# Patient Record
Sex: Male | Born: 1995 | Race: White | Hispanic: Yes | Marital: Single | Smoking: Former smoker
Health system: Southern US, Community
[De-identification: ages and names within clinical notes are randomized; demographics above are authoritative.]

## PROBLEM LIST (undated history)

## (undated) DIAGNOSIS — F909 Attention-deficit hyperactivity disorder, unspecified type: Secondary | ICD-10-CM

## (undated) DIAGNOSIS — R569 Unspecified convulsions: Secondary | ICD-10-CM

## (undated) DIAGNOSIS — J349 Unspecified disorder of nose and nasal sinuses: Secondary | ICD-10-CM

## (undated) DIAGNOSIS — T07XXXA Unspecified multiple injuries, initial encounter: Secondary | ICD-10-CM

## (undated) DIAGNOSIS — K219 Gastro-esophageal reflux disease without esophagitis: Secondary | ICD-10-CM

## (undated) HISTORY — DX: Attention-deficit hyperactivity disorder, unspecified type: F90.9

## (undated) HISTORY — DX: Unspecified disorder of nose and nasal sinuses: J34.9

## (undated) HISTORY — DX: Unspecified multiple injuries, initial encounter: T07.XXXA

## (undated) HISTORY — DX: Unspecified convulsions: R56.9

## (undated) HISTORY — DX: Gastro-esophageal reflux disease without esophagitis: K21.9

---
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
## 2013-12-02 ENCOUNTER — Emergency Department: Payer: Self-pay | Admitting: Emergency Medicine

## 2013-12-02 LAB — URINALYSIS, COMPLETE
BILIRUBIN, UR: NEGATIVE
Bacteria: NONE SEEN
Glucose,UR: NEGATIVE mg/dL (ref 0–75)
Ketone: NEGATIVE
LEUKOCYTE ESTERASE: NEGATIVE
Nitrite: NEGATIVE
Ph: 6 (ref 4.5–8.0)
Protein: 30
RBC,UR: <1 /HPF (ref 0–5)
SPECIFIC GRAVITY: 1.019 (ref 1.003–1.030)
Squamous Epithelial: NONE SEEN
WBC UR: 1 /HPF (ref 0–5)

## 2013-12-02 LAB — BASIC METABOLIC PANEL
ANION GAP: 13 (ref 7–16)
BUN: 9 mg/dL (ref 7–18)
CALCIUM: 9 mg/dL (ref 8.5–10.1)
CHLORIDE: 109 mmol/L — AB (ref 98–107)
CREATININE: 1.35 mg/dL — AB (ref 0.60–1.30)
Co2: 19 mmol/L — ABNORMAL LOW (ref 21–32)
EGFR (African American): >60
EGFR (Non-African Amer.): >60
GLUCOSE: 105 mg/dL — AB (ref 65–99)
Osmolality: 280 (ref 275–301)
POTASSIUM: 3.6 mmol/L (ref 3.5–5.1)
Sodium: 141 mmol/L (ref 136–145)

## 2013-12-02 LAB — DRUG SCREEN, URINE
AMPHETAMINES, UR SCREEN: NEGATIVE (ref ?–1000)
BENZODIAZEPINE, UR SCRN: POSITIVE (ref ?–200)
Barbiturates, Ur Screen: NEGATIVE (ref ?–200)
CANNABINOID 50 NG, UR ~~LOC~~: POSITIVE (ref ?–50)
COCAINE METABOLITE, UR ~~LOC~~: NEGATIVE (ref ?–300)
MDMA (ECSTASY) UR SCREEN: NEGATIVE (ref ?–500)
Methadone, Ur Screen: NEGATIVE (ref ?–300)
Opiate, Ur Screen: NEGATIVE (ref ?–300)
Phencyclidine (PCP) Ur S: NEGATIVE (ref ?–25)
TRICYCLIC, UR SCREEN: NEGATIVE (ref ?–1000)

## 2013-12-02 LAB — CBC WITH DIFFERENTIAL/PLATELET
BASOS PCT: 0.9 %
Basophil #: 0.1 10*3/uL (ref 0.0–0.1)
Eosinophil #: 0.1 10*3/uL (ref 0.0–0.7)
Eosinophil %: 2.3 %
HCT: 45.6 % (ref 40.0–52.0)
HGB: 14.7 g/dL (ref 13.0–18.0)
Lymphocyte #: 2.5 10*3/uL (ref 1.0–3.6)
Lymphocyte %: 38.5 %
MCH: 29.2 pg (ref 26.0–34.0)
MCHC: 32.2 g/dL (ref 32.0–36.0)
MCV: 91 fL (ref 80–100)
MONO ABS: 0.4 x10 3/mm (ref 0.2–1.0)
Monocyte %: 6 %
NEUTROS PCT: 52.3 %
Neutrophil #: 3.4 10*3/uL (ref 1.4–6.5)
PLATELETS: 271 10*3/uL (ref 150–440)
RBC: 5.03 10*6/uL (ref 4.40–5.90)
RDW: 12.6 % (ref 11.5–14.5)
WBC: 6.5 10*3/uL (ref 3.8–10.6)

## 2013-12-07 ENCOUNTER — Ambulatory Visit: Payer: BLUE CROSS/BLUE SHIELD | Attending: Neurology

## 2013-12-07 ENCOUNTER — Other Ambulatory Visit: Payer: Self-pay | Admitting: Neurology

## 2013-12-07 DIAGNOSIS — R569 Unspecified convulsions: Secondary | ICD-10-CM | POA: Insufficient documentation

## 2013-12-07 MED ORDER — GADOBUTROL 1 MMOL/ML IV SOLN
9.0000 mL | Freq: Once | INTRAVENOUS | Status: AC | PRN
Start: 2013-12-07 — End: 2013-12-07
  Administered 2013-12-07: 9 mmol via INTRAVENOUS
  Filled 2013-12-07: qty 10

## 2013-12-09 ENCOUNTER — Other Ambulatory Visit: Payer: Self-pay | Admitting: Neurology

## 2013-12-09 DIAGNOSIS — R569 Unspecified convulsions: Secondary | ICD-10-CM

## 2014-11-16 ENCOUNTER — Emergency Department
Admission: EM | Admit: 2014-11-16 | Discharge: 2014-11-16 | Discharge status: Against Medical Advice | Payer: Federal, State, Local not specified - PPO | Attending: Emergency Medicine | Admitting: Emergency Medicine

## 2014-11-16 ENCOUNTER — Encounter: Payer: Self-pay | Admitting: Medical Oncology

## 2014-11-16 DIAGNOSIS — R55 Syncope and collapse: Secondary | ICD-10-CM | POA: Insufficient documentation

## 2014-11-16 LAB — URINALYSIS COMPLETE WITH MICROSCOPIC (ARMC ONLY)
BACTERIA UA: NONE SEEN
Bilirubin Urine: NEGATIVE
Glucose, UA: NEGATIVE mg/dL
KETONES UR: NEGATIVE mg/dL
LEUKOCYTES UA: NEGATIVE
NITRITE: NEGATIVE
PH: 5 (ref 5.0–8.0)
PROTEIN: 30 mg/dL — AB
SPECIFIC GRAVITY, URINE: 1.018 (ref 1.005–1.030)

## 2014-11-16 LAB — BASIC METABOLIC PANEL
ANION GAP: 8 (ref 5–15)
BUN: 9 mg/dL (ref 6–20)
CO2: 25 mmol/L (ref 22–32)
Calcium: 10 mg/dL (ref 8.9–10.3)
Chloride: 106 mmol/L (ref 101–111)
Creatinine, Ser: 1.15 mg/dL (ref 0.61–1.24)
GLUCOSE: 141 mg/dL — AB (ref 65–99)
POTASSIUM: 3.7 mmol/L (ref 3.5–5.1)
Sodium: 139 mmol/L (ref 135–145)

## 2014-11-16 LAB — GLUCOSE, CAPILLARY: Glucose-Capillary: 129 mg/dL — ABNORMAL HIGH (ref 65–99)

## 2014-11-16 LAB — CBC
HEMATOCRIT: 47.9 % (ref 40.0–52.0)
HEMOGLOBIN: 15.8 g/dL (ref 13.0–18.0)
MCH: 29.2 pg (ref 26.0–34.0)
MCHC: 32.9 g/dL (ref 32.0–36.0)
MCV: 88.7 fL (ref 80.0–100.0)
Platelets: 303 10*3/uL (ref 150–440)
RBC: 5.4 MIL/uL (ref 4.40–5.90)
RDW: 13 % (ref 11.5–14.5)
WBC: 13.3 10*3/uL — AB (ref 3.8–10.6)
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
## 2014-12-23 ENCOUNTER — Ambulatory Visit
Admission: RE | Admit: 2014-12-23 | Discharge: 2014-12-23 | Disposition: A | Discharge status: Home / Self Care | Payer: Federal, State, Local not specified - PPO | Source: Ambulatory Visit | Attending: Family Medicine | Admitting: Family Medicine

## 2014-12-23 ENCOUNTER — Other Ambulatory Visit: Payer: Self-pay | Admitting: Family Medicine

## 2014-12-23 DIAGNOSIS — N50812 Left testicular pain: Secondary | ICD-10-CM

## 2014-12-23 DIAGNOSIS — N433 Hydrocele, unspecified: Secondary | ICD-10-CM | POA: Diagnosis not present

## 2014-12-23 DIAGNOSIS — N451 Epididymitis: Secondary | ICD-10-CM | POA: Insufficient documentation

## 2014-12-23 DIAGNOSIS — N5089 Other specified disorders of the male genital organs: Secondary | ICD-10-CM

## 2015-02-05 IMAGING — CT CT HEAD WITHOUT CONTRAST
2 series · 14 of 30 positions shown, 16 images · non-contrast
Comparison: None.

CLINICAL DATA: Syncope, possible seizure.

EXAM:
CT HEAD WITHOUT CONTRAST
TECHNIQUE: Contiguous axial images were obtained from the base of the skull
through the vertex without intravenous contrast.

[Series 2: head wo · axial · 0.44mm/px · z∈[-167,-67]mm · 6 of 32 slices shown, 8 images]
[im 5/32  brain]
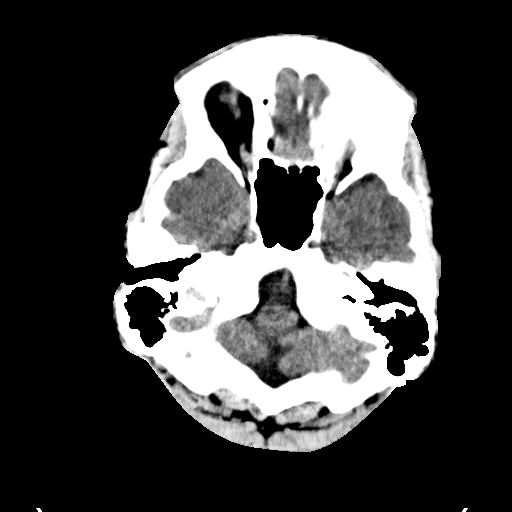
[im 5/32  bone]
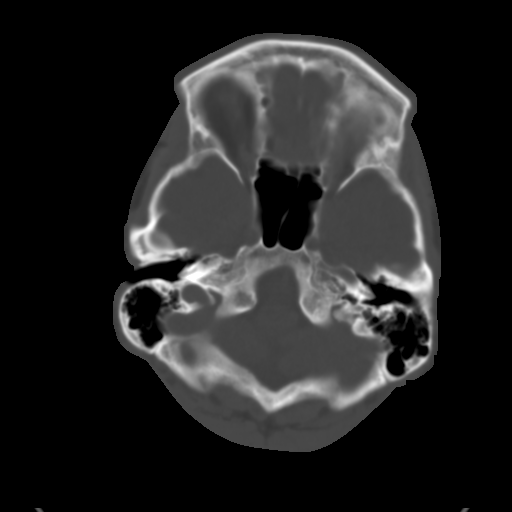
[im 9/32  brain]
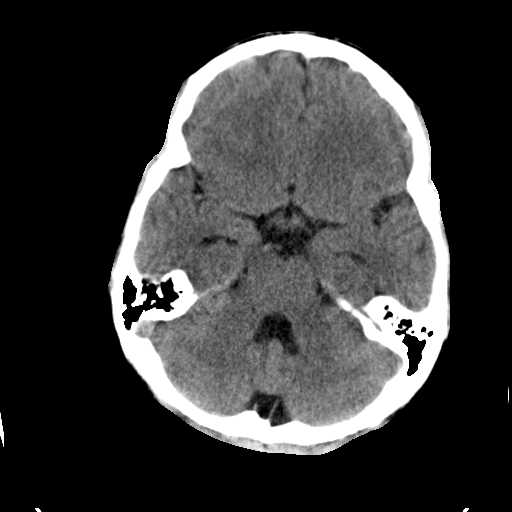
[im 14/32  brain]
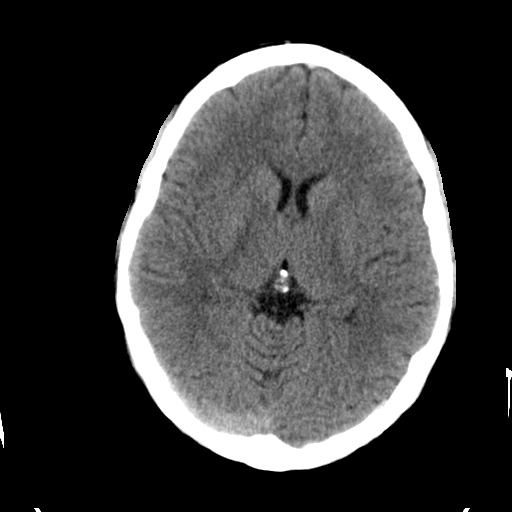
[im 18/32  brain]
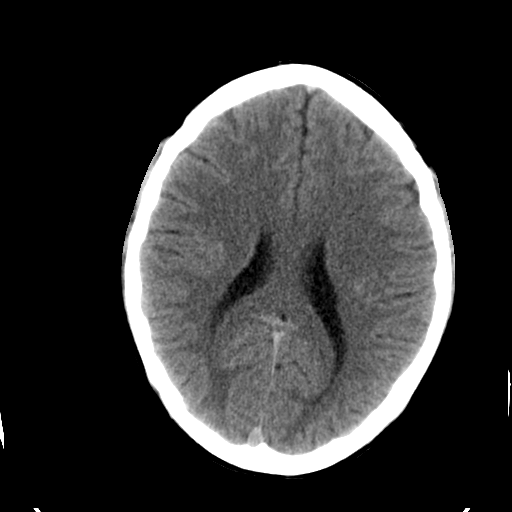
[im 23/32  brain]
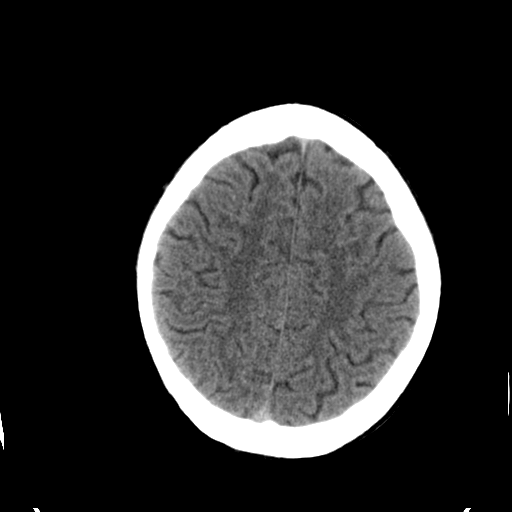
[im 23/32  bone]
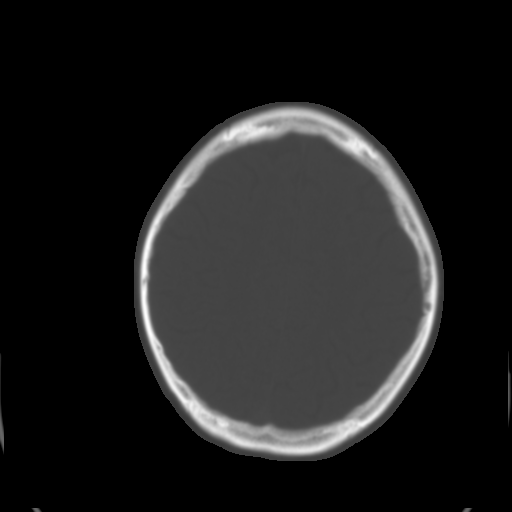
[im 27/32  brain]
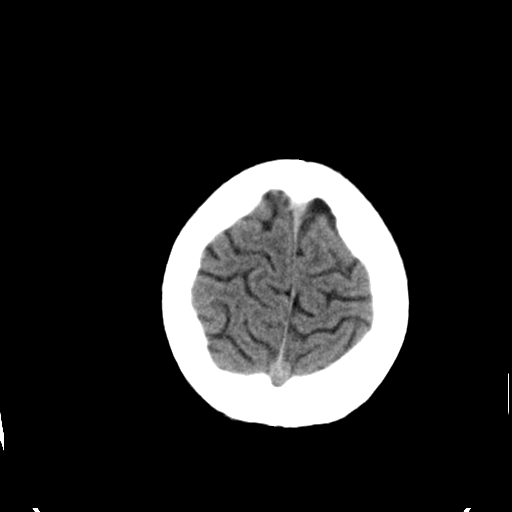

[Series 3: head bone · axial · 0.44mm/px · z∈[-172,-59]mm · 8 of 96 slices shown]
[im 10/96  bone]
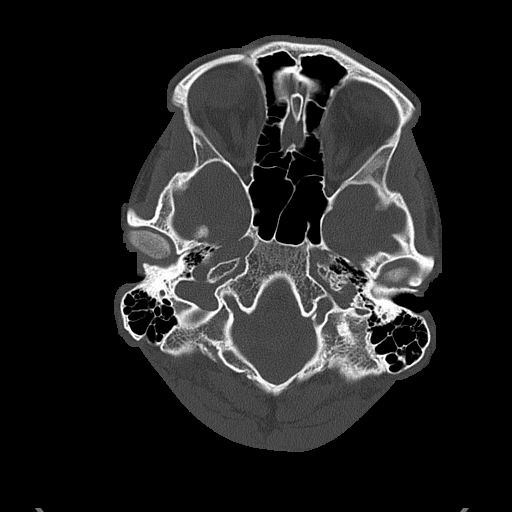
[im 19/96  bone]
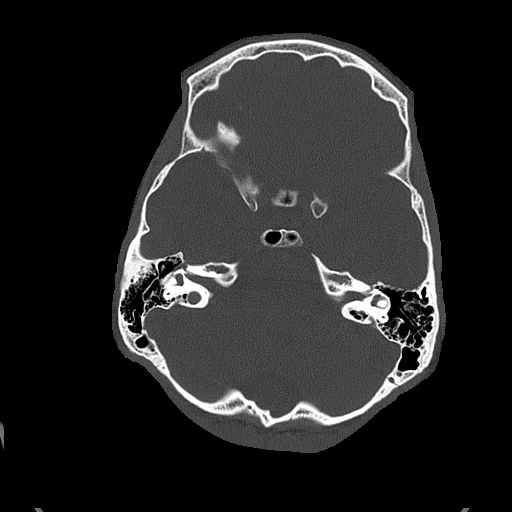
[im 32/96  bone]
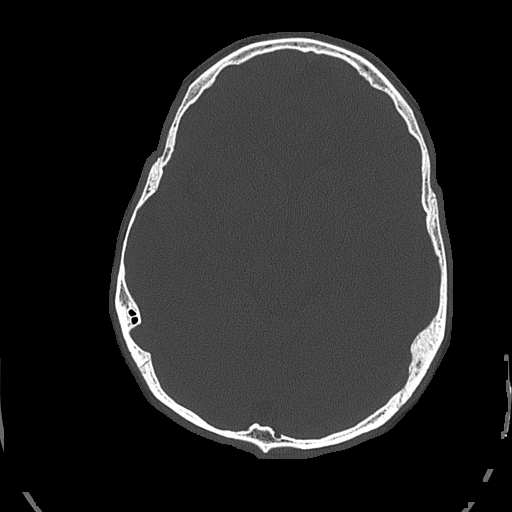
[im 41/96  bone]
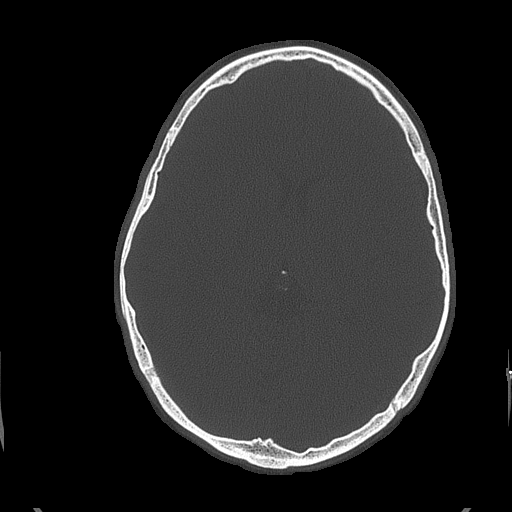
[im 55/96  bone]
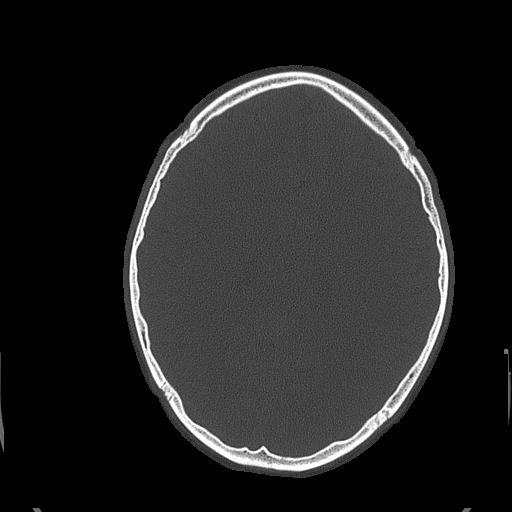
[im 64/96  bone]
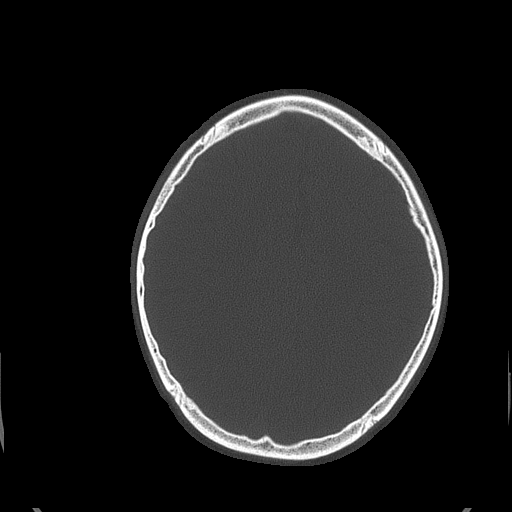
[im 77/96  bone]
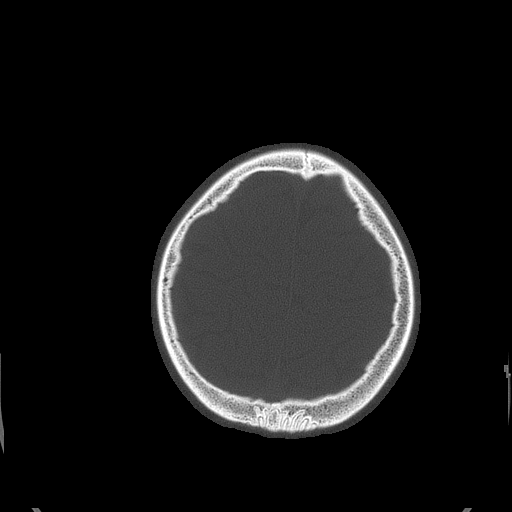
[im 86/96  bone]
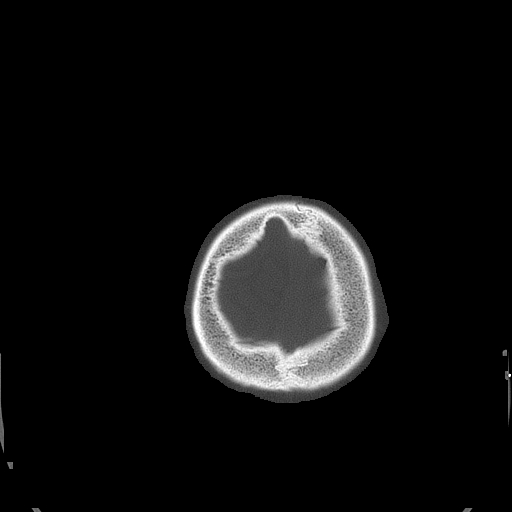

[14 of 30 positions shown; findings below may reference images not displayed]

FINDINGS: Bony calvarium appears intact. No mass effect or midline shift is
noted. Ventricular size is within normal limits. There is no
evidence of mass lesion, hemorrhage or acute infarction.
IMPRESSION: Normal head CT.

## 2015-09-15 ENCOUNTER — Other Ambulatory Visit (FREE_STANDING_LABORATORY_FACILITY): Payer: BLUE CROSS/BLUE SHIELD

## 2015-09-15 ENCOUNTER — Encounter (INDEPENDENT_AMBULATORY_CARE_PROVIDER_SITE_OTHER): Payer: Self-pay | Admitting: Internal Medicine

## 2015-09-15 ENCOUNTER — Ambulatory Visit (FREE_STANDING_LABORATORY_FACILITY): Payer: BLUE CROSS/BLUE SHIELD | Admitting: Internal Medicine

## 2015-09-15 VITALS — BP 99/62 | HR 65 | Temp 97.8°F | Ht 73.0 in | Wt 157.0 lb

## 2015-09-15 DIAGNOSIS — D8989 Other specified disorders involving the immune mechanism, not elsewhere classified: Secondary | ICD-10-CM

## 2015-09-15 DIAGNOSIS — R109 Unspecified abdominal pain: Secondary | ICD-10-CM

## 2015-09-15 DIAGNOSIS — R509 Fever, unspecified: Secondary | ICD-10-CM

## 2015-09-15 DIAGNOSIS — R569 Unspecified convulsions: Secondary | ICD-10-CM

## 2015-09-15 DIAGNOSIS — J029 Acute pharyngitis, unspecified: Secondary | ICD-10-CM

## 2015-09-15 DIAGNOSIS — Z0000 Encounter for general adult medical examination without abnormal findings: Secondary | ICD-10-CM

## 2015-09-15 LAB — CBC AND DIFFERENTIAL
Hematocrit: 43.9 % (ref 42.0–52.0)
Hgb: 15.3 g/dL (ref 13.0–17.0)
MCH: 31.2 pg (ref 28.0–32.0)
MCHC: 34.9 g/dL (ref 32.0–36.0)
MCV: 89.4 fL (ref 80.0–100.0)
MPV: 10.8 fL (ref 9.4–12.3)
Platelets: 397 10*3/uL (ref 140–400)
RBC: 4.91 10*6/uL (ref 4.70–6.00)
RDW: 12 % (ref 12–15)
WBC: 12.22 10*3/uL — ABNORMAL HIGH (ref 3.50–10.80)

## 2015-09-15 LAB — MAN DIFF ONLY
Atypical Lymphocytes %: 2 %
Atypical Lymphocytes Absolute: 0.22 10*3/uL — ABNORMAL HIGH
Band Neutrophils Absolute: 0 10*3/uL (ref 0.00–1.00)
Band Neutrophils: 0 %
Basophils Absolute Manual: 0.11 10*3/uL (ref 0.00–0.20)
Basophils Manual: 1 %
Eosinophils Absolute Manual: 0.33 10*3/uL (ref 0.00–0.70)
Eosinophils Manual: 3 %
Lymphocytes Absolute Manual: 3.45 10*3/uL (ref 0.50–4.40)
Lymphocytes Manual: 28 %
Metamyelocytes Absolute: 0.11 10*3/uL — ABNORMAL HIGH
Metamyelocytes: 1 %
Monocytes Absolute: 0.55 10*3/uL (ref 0.00–1.20)
Monocytes Manual: 5 %
Myelocytes Absolute: 0.11 10*3/uL — ABNORMAL HIGH
Myelocytes: 1 %
Neutrophils Absolute Manual: 7.33 10*3/uL (ref 1.80–8.10)
Nucleated RBC: 0 /100 WBC (ref 0–1)
Segmented Neutrophils: 60 %

## 2015-09-15 LAB — CELL MORPHOLOGY
Cell Morphology: ABNORMAL — AB
Platelet Estimate: NORMAL

## 2015-09-15 LAB — COMPREHENSIVE METABOLIC PANEL
ALT: 19 U/L (ref 0–55)
AST (SGOT): 16 U/L (ref 5–34)
Albumin/Globulin Ratio: 1.2 (ref 0.9–2.2)
Albumin: 4.3 g/dL (ref 3.5–5.0)
Alkaline Phosphatase: 88 U/L (ref 38–106)
BUN: 15 mg/dL (ref 9.0–28.0)
Bilirubin, Total: 0.7 mg/dL (ref 0.1–1.2)
CO2: 25 mEq/L (ref 21–30)
Calcium: 9.9 mg/dL (ref 8.5–10.5)
Chloride: 105 mEq/L (ref 100–111)
Creatinine: 1 mg/dL (ref 0.5–1.5)
Globulin: 3.6 g/dL (ref 2.0–3.7)
Glucose: 80 mg/dL (ref 70–100)
Potassium: 4.1 mEq/L (ref 3.5–5.1)
Protein, Total: 7.9 g/dL (ref 6.0–8.3)
Sodium: 142 mEq/L (ref 135–146)

## 2015-09-15 LAB — GFR: EGFR: >60

## 2015-09-15 LAB — IGM: Immunoglobulin M: 53 mg/dL (ref 22–293)

## 2015-09-15 LAB — IGG: Immunoglobulin G: 996 mg/dL (ref 540–1822)

## 2015-09-15 LAB — POCT RAPID STREP A: Rapid Strep A Screen POCT: NEGATIVE

## 2015-09-15 LAB — IGA: Immunoglobulin A: 269 mg/dL (ref 63–484)

## 2015-09-15 LAB — HEMOLYSIS INDEX: Hemolysis Index: 14 (ref 0–18)

## 2015-09-15 MED ORDER — AZITHROMYCIN 250 MG PO TABS
ORAL_TABLET | ORAL | Status: AC
Start: 2015-09-15 — End: 2015-09-20

## 2015-09-15 NOTE — Progress Notes (Addendum)
CHIEF COMPLAINT: Establish Care; Annual Exam; and Abdominal Pain      HPI:  Craig Phelps is a 20 y.o. male patient of Georgette Helmer, Rolin Barry, MD who presents today for acute visit.      Sick in fall of 2014 when hiking in [place]  Got really sick there, not eating then lost a lot of weight  Has been lower weight since then   Has not been able to eat as much as in the past esp in the least 2 years  Has acid reflux and this contributes    Last Wednesday (1.5 weeks ago) started having back pian on right side  Mid back area  Pain was 6/10 at worst, more of an ache in character   Currently 0/10  Comes and goes; can last for 1.5 hours   Not correlated with foods  More comfortable when lying on his left side, not flat.  Stretching his back does seem to make it worse   Tried to rub it and took some Aleve for this   Did not seem to be helping much   Seemed to migrate around to front under right rib  Seemed to be more like a stomach ache   Wondered if it was his kidney   Had some fevers (101.73F) and chills with sweating on Friday and Saturday (a week ago) prior to that   No dysuria, no increased frequency of urination   Sometimes felt that he would urinate but not have complete emptying   Brother was diagnosed with strep a few days before these symptoms occurred  Patient with emesis x 2 episodes in the last week  Had some sore throat to start but not now    Has h/o seizures  Last fall 2016  Told by Neuro that it may have been induced by "lifestyle" incl alcohol use  Recommended to start Lamictal but he did not   Was supposed to f/up 08/2015 there but now here for the summer  Does not have a neurologist here yet      PROBLEM LIST:  Patient Active Problem List    Diagnosis Date Noted   . Seizure 09/15/2015     One in 2015, 2016  Saw Neurology   Recommend Lamictal but he did not take it    F/up due 08/2015     . Pilonidal cyst 08/24/2012     Past Medical History   Diagnosis Date   . GERD (gastroesophageal reflux disease)       occ/prilosec OTC   . Fractures      rt wrist x 2   . ADHD (attention deficit hyperactivity disorder)      took Adderall in highschool and college but sopped   . ENT disease      Pollen allergies/sneezing   . Pilonidal cyst 2014   . Seizure    . H1N1 influenza 2009     No current outpatient prescriptions on file.     No current facility-administered medications for this visit.       Allergies   Allergen Reactions   . Other      Environmental allergies         SOCIAL HISTORY:   Social History     Social History   . Marital Status: Single     Spouse Name: N/A   . Number of Children: 0   . Years of Education: N/A     Occupational History   . Archivist  Social History Main Topics   . Smoking status: Never Smoker    . Smokeless tobacco: Never Used   . Alcohol Use: 0.0 oz/week     0 Standard drinks or equivalent per week   . Drug Use: No   . Sexual Activity: Not on file     Other Topics Concern   . Not on file     Social History Narrative           There is no immunization history on file for this patient.  Health Maintenance   Topic Date Due   . HPV Series (1 of 3 - Male 3 Dose Series) 11/16/2004   . INFLUENZA VACCINE  10/20/2015   . DEPRESSION SCREENING  09/14/2016   ::          REVIEW OF SYSTEMS - see HPI - otherwise negative x 10 systems    PHYSICAL EXAMINATION:  BP 99/62 mmHg  Pulse 65  Temp(Src) 97.8 F (36.6 C) (Oral)  Ht 1.854 m (6\' 1" )  Wt 71.215 kg (157 lb)  BMI 20.72 kg/m2    Body mass index is 20.72 kg/(m^2).    Physical Exam   Constitutional: He appears well-developed and well-nourished. No distress.   HENT:   Head: Normocephalic and atraumatic.   Right Ear: External ear normal.   Left Ear: External ear normal.   Mouth/Throat: Oropharynx is clear and moist. No oropharyngeal exudate (large tonsils, no exudate).   Eyes: Conjunctivae are normal. Pupils are equal, round, and reactive to light. Right eye exhibits no discharge. Left eye exhibits no discharge. No scleral icterus.   Neck: Normal range  of motion. Neck supple. No thyromegaly present.   Cardiovascular: Normal rate, regular rhythm, normal heart sounds and intact distal pulses.    No murmur heard.  Pulmonary/Chest: Effort normal and breath sounds normal. No respiratory distress. He has no wheezes.   Abdominal: Soft. Bowel sounds are normal. He exhibits no distension. There is no tenderness. There is no rebound and no guarding.   Musculoskeletal: He exhibits no edema.   Lymphadenopathy:     He has no cervical adenopathy.   Neurological: He is alert.   Skin: Skin is warm. No rash noted. He is diaphoretic (clammy skin).   Psychiatric: He has a normal mood and affect. His behavior is normal.   Vitals reviewed.    Rapid strep neg    ASSESSMENT/PLAN:  1. Annual physical exam  Will need to RTC when feeling better     2. Seizure  - Ambulatory referral to Neurology    3. Sore throat  - POCT Rapid Group A Strep  - Throat Culture  - CBC and differential  - Comprehensive metabolic panel    4. Abdominal pain, unspecified location  - POCT Rapid Group A Strep  - Throat Culture  - CBC and differential  - Comprehensive metabolic panel    5. Fever, unspecified fever cause  - CBC and differential  - Comprehensive metabolic panel    Addendum: patient with leukocytosis; will treat with Azithromycin though source is unclear   Patient aware and will pick up from pharmacy today   He will let me know if he does not start to feel better     The patient indicates understanding of these issues and agrees with the plan.    Risk & Benefits of any new medication(s) were explained to the patient who verbalized understanding.     Call if symptoms persist, worsen, or change.  Call  with updates/questions/concerns.      Follow up with  Treyana Sturgell, Rolin Barry, MD in Return for Physical.

## 2015-09-15 NOTE — Addendum Note (Signed)
Addended by: Rosary Lively on: 09/15/2015 04:50 PM     Modules accepted: Orders

## 2015-09-15 NOTE — Progress Notes (Signed)
Have you seen any specialists/other providers since your last visit with [place]? Dr Vidal Schwalbe    Limb alert protocol reviewed?  Yes, no restrictions

## 2015-09-18 LAB — IGE: Immunoglobulin E: 72 (ref <=114)

## 2015-09-19 LAB — IGG SUBCLASSES
IgG Subclass 1: 490 mg/dL (ref 382–929)
IgG Subclass 2: 258 mg/dL (ref 241–700)
IgG Subclass 3: 45 mg/dL (ref 22–178)
IgG Subclass 4: 5.3 mg/dL (ref 4.0–86.0)
Immunoglobulin G: 901 mg/dL (ref 694–1618)

## 2015-09-20 ENCOUNTER — Other Ambulatory Visit (INDEPENDENT_AMBULATORY_CARE_PROVIDER_SITE_OTHER): Payer: Self-pay | Admitting: Internal Medicine

## 2015-09-20 DIAGNOSIS — D72829 Elevated white blood cell count, unspecified: Secondary | ICD-10-CM

## 2016-01-25 IMAGING — US US ART/VEN ABD/PELV/SCROTUM DOPPLER LTD
1 series · 13 of 25 positions shown · non-contrast
Comparison: None.

CLINICAL DATA: Left scrotal region pain and soft tissue prominence
for 1 day

EXAM:
SCROTAL ULTRASOUND
DOPPLER ULTRASOUND OF THE TESTICLES
TECHNIQUE: Complete ultrasound examination of the testicles, epididymis, and
other scrotal structures was performed. Color and spectral Doppler
ultrasound were also utilized to evaluate blood flow to the
testicles.

[Series 1: us art/ven abd/pelv/scrotum doppler ltd · 0.07mm/px · 13 of 76 slices shown]
[im 1/76]
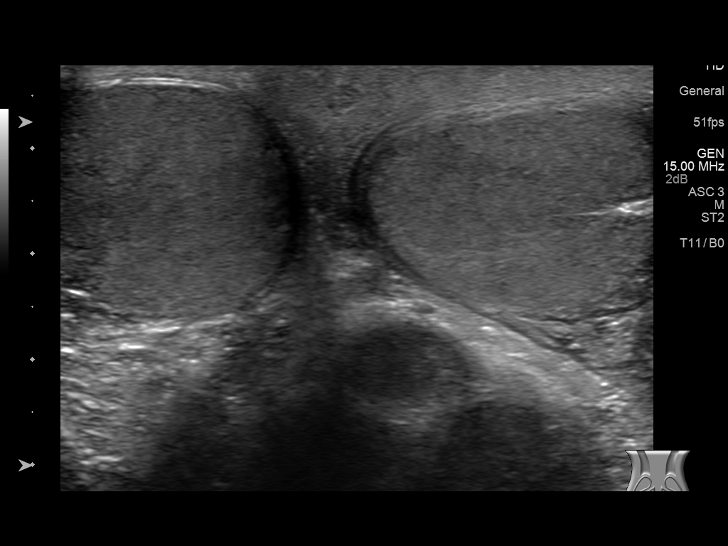
[im 7/76]
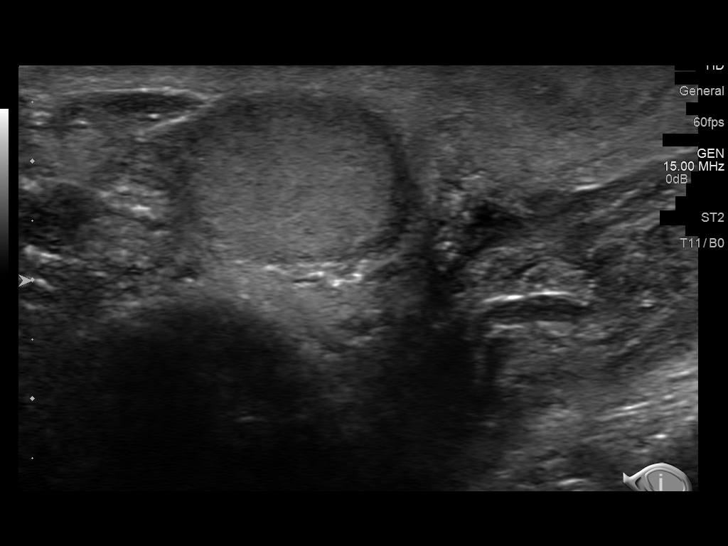
[im 13/76]
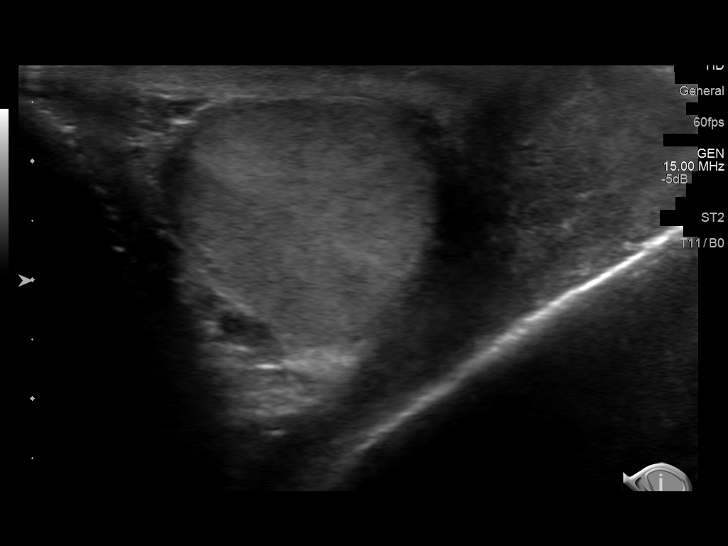
[im 19/76]
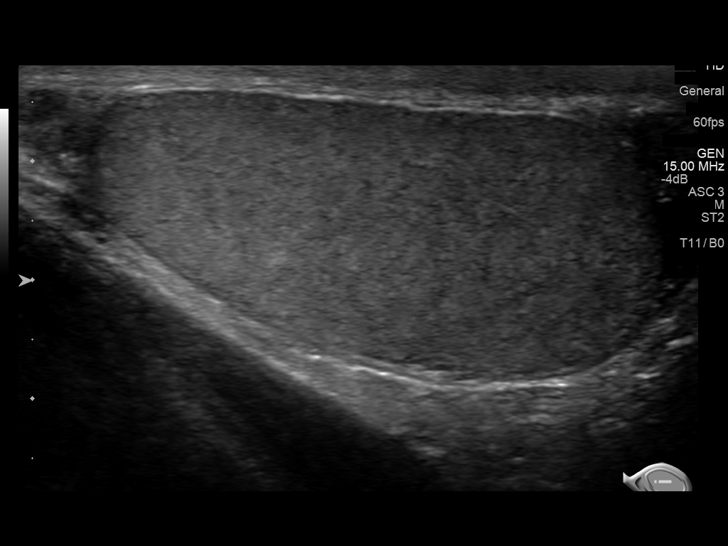
[im 26/76]
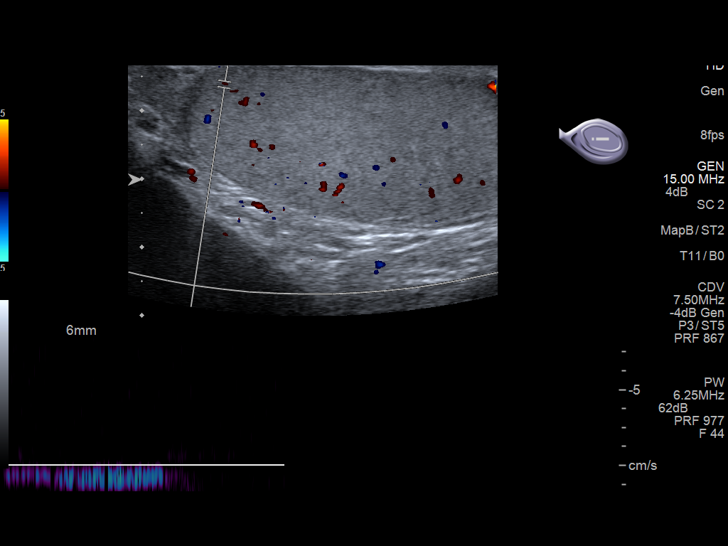
[im 32/76]
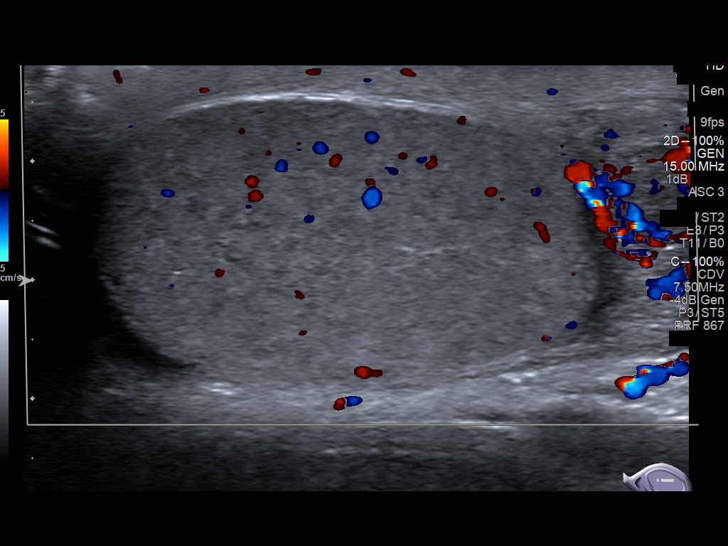
[im 38/76]
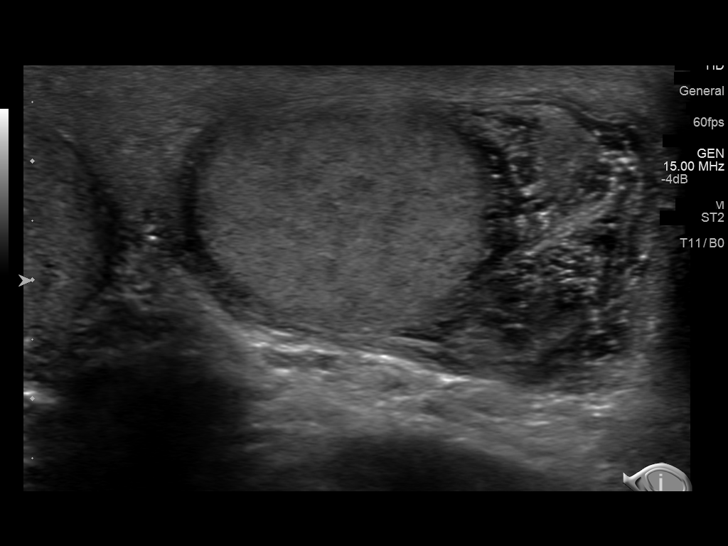
[im 44/76]
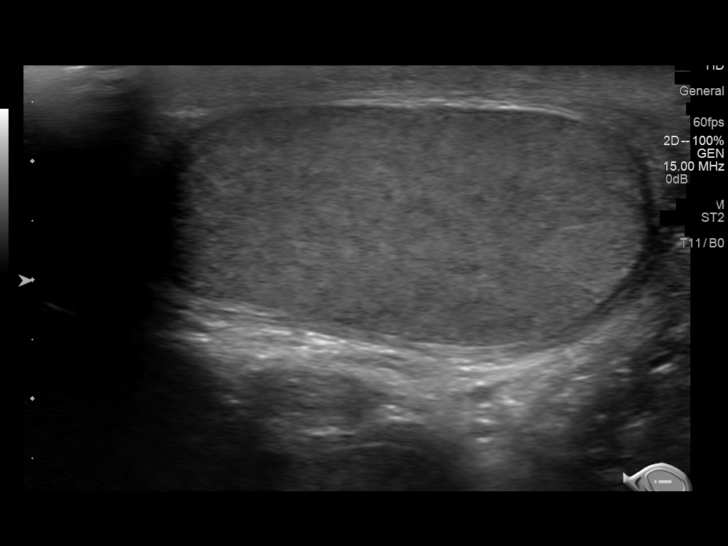
[im 51/76]
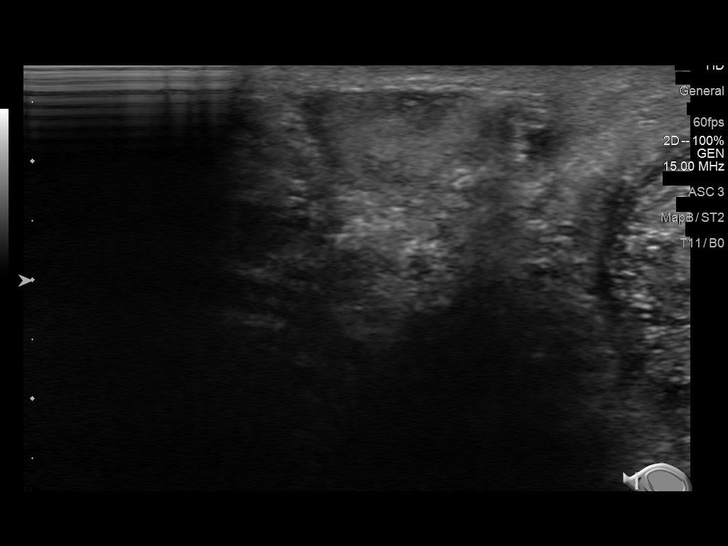
[im 57/76]
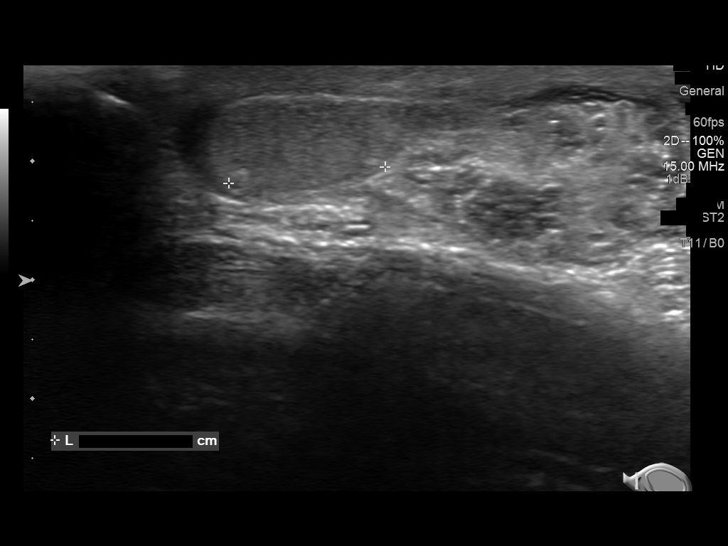
[im 63/76]
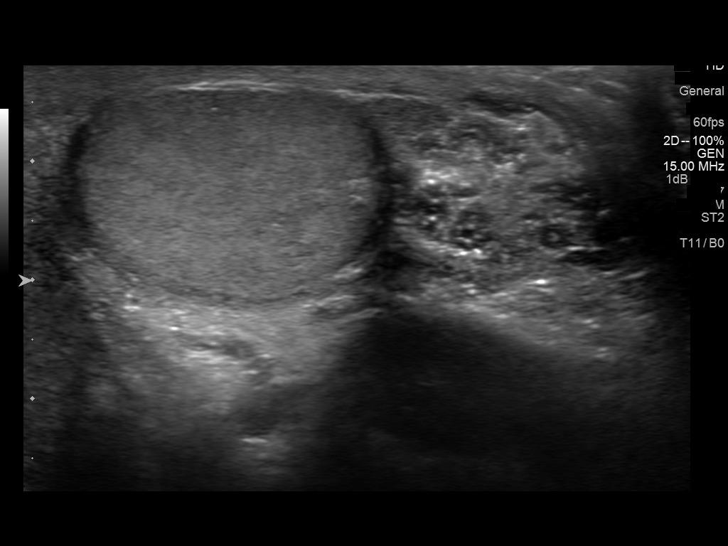
[im 69/76]
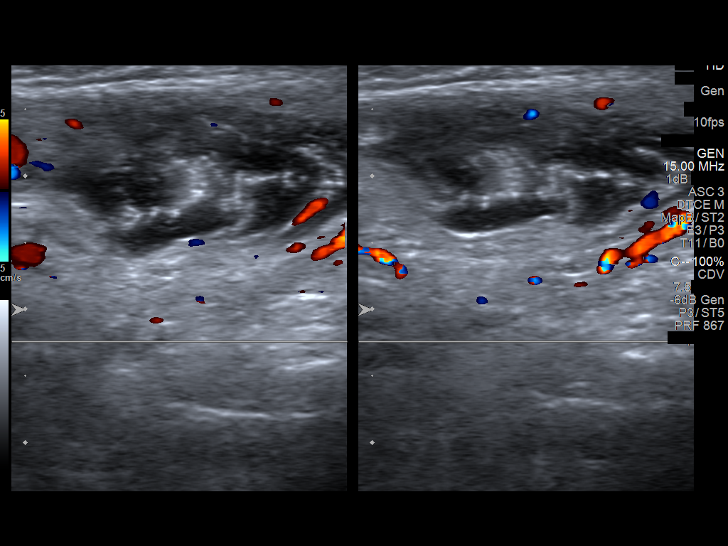
[im 76/76]
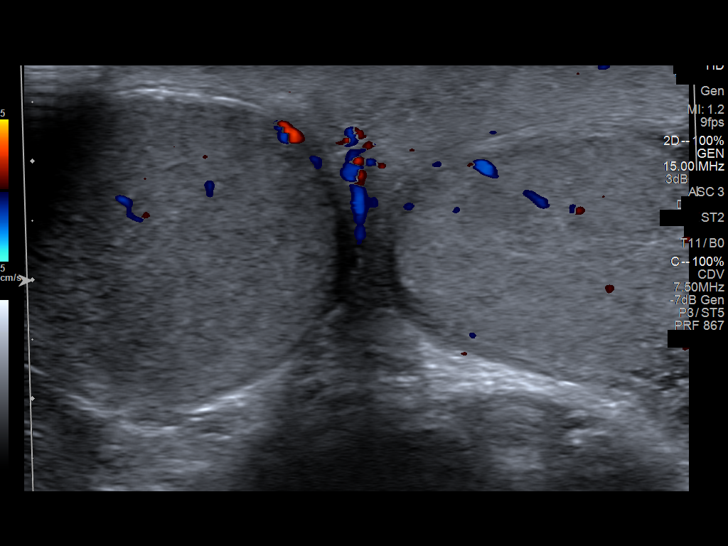

[13 of 25 positions shown; findings below may reference images not displayed]

FINDINGS: Right testicle

Measurements: 5.0 x 2.3 x 2.8 cm. No mass or microlithiasis
visualized.

Left testicle

Measurements: 4.1 x 2.4 x 2.9 cm. No mass or microlithiasis
visualized.

Right epididymis:  Normal in size and appearance.

Left epididymis: There is diffuse enlargement of the body and tail
of the left epididymis with extensive hypervascularity in these
areas consistent with epididymitis. There is a complex structure
which appears partially cystic and partially septated immediately
adjacent to the tail of the epididymis measuring 2.8 x 1.3 x 2.0 cm.

Hydrocele: There is a minimal hydrocele on the left. There is no
appreciable hydrocele on the left.

Varicocele:  None visualized.

Pulsed Doppler interrogation of both testes demonstrates normal low
resistance arterial and venous waveforms bilaterally. The peak
systolic velocity in the right testis is 5 cm/sec. The peak systolic
velocity in the left testis is 5 cm/sec.

There is mild scrotal wall thickening on the left.
IMPRESSION: Evidence of epididymitis on the left involving the body and tail of
the epididymis. There is diffuse wall thickening and edema of the
body and tail of the left epididymis with marked hyperemia
throughout this area. There is a complex partially cystic and
irregularly septated structure immediately adjacent to the tail of
the left epididymis measuring 2.8 x 1.3 x 2.0 cm which probably
represents a focal abscess within the left scrotal sac, likely due
to the adjacent epididymitis. There is a minimal left-sided
hydrocele. There is mild scrotal wall thickening on the left.

No lesion is identified in the right scrotal sac.

No intratesticular mass or torsion on either side. Blood flow in
each testis appears normal.

Given the changes in the left scrotal region, it would be prudent to
obtain repeat study after appropriate therapy for epididymitis and
apparent periepididymal abscess.

## 2016-10-28 ENCOUNTER — Encounter (INDEPENDENT_AMBULATORY_CARE_PROVIDER_SITE_OTHER): Payer: Self-pay | Admitting: Internal Medicine

## 2016-10-28 ENCOUNTER — Ambulatory Visit (INDEPENDENT_AMBULATORY_CARE_PROVIDER_SITE_OTHER): Payer: BLUE CROSS/BLUE SHIELD | Admitting: Internal Medicine

## 2016-10-28 VITALS — BP 108/71 | HR 91 | Temp 99.7°F | Wt 158.4 lb

## 2016-10-28 DIAGNOSIS — J029 Acute pharyngitis, unspecified: Secondary | ICD-10-CM

## 2016-10-28 LAB — POCT RAPID STREP A: Rapid Strep A Screen POCT: NEGATIVE

## 2016-10-28 MED ORDER — AZITHROMYCIN 250 MG PO TABS
ORAL_TABLET | ORAL | 0 refills | Status: AC
Start: 2016-10-28 — End: 2016-11-02

## 2016-10-28 NOTE — Progress Notes (Signed)
Have you seen any specialists/other providers since your last visit with [place]?      [facility] a couple of months ago      Arm preference verified?     Yes    The patient is due for depression screening and influenza vaccine

## 2016-10-28 NOTE — Progress Notes (Signed)
CHIEF COMPLAINT: Swollen eyelids (got sick the Friday before last); Neck Pain; Headache; and Nausea      HPI:  Craig Phelps is a 21 y.o. male patient of Makinsley Schiavi, Rolin Barry, MD who presents today for acute visit.      About one week ago he had fever (101.10F) and chills, malaise and sweating   Then after a few days the fever resolved and he was feeling better but still with some soreness on back of head and some headaches  This went away and he was feeling back to normal      On Friday, he got a really sore throat about 6-7/10 that is worse when he swallows  Feels like glands in neck are popping out   He looked in his throat and saw tonsils were white and swollen   He has had no fever this time   Not coughing much but occ dry cough   Recently restarted school but has no known sick contacts        PROBLEM LIST:  Patient Active Problem List    Diagnosis Date Noted   . Seizure 09/15/2015     One in 2015, 2016  Saw Neurology   Recommend Lamictal but he did not take it    F/up due 08/2015     . Pilonidal cyst 08/24/2012     Past Medical History:   Diagnosis Date   . ADHD (attention deficit hyperactivity disorder)     took Adderall in highschool and college but sopped   . ENT disease     Pollen allergies/sneezing   . Fractures     rt wrist x 2   . GERD (gastroesophageal reflux disease)     occ/prilosec OTC   . H1N1 influenza 2009   . Pilonidal cyst 2014   . Seizure      Current Outpatient Prescriptions   Medication Sig Dispense Refill   . azithromycin (ZITHROMAX) 250 MG tablet 2 tabs po on day 1, then 1 tab po qd until complete 6 tablet 0     No current facility-administered medications for this visit.        Allergies   Allergen Reactions   . Other      Environmental allergies         SOCIAL HISTORY:   Social History     Social History   . Marital status: Single     Spouse name: N/A   . Number of children: 0   . Years of education: N/A     Occupational History   . College Student      Social History Main Topics   .  Smoking status: Never Smoker   . Smokeless tobacco: Never Used   . Alcohol use 0.0 oz/week   . Drug use: No   . Sexual activity: Yes     Partners: Female      Comment: monogamous     Other Topics Concern   . Not on file     Social History Narrative    Taking a break from school    Wanting to transfer for the fall    Working at [employer]        Exercise: no     Diet: nothing special, tries to avoid GERD inducing foods               REVIEW OF SYSTEMS - see HPI - otherwise as below  Review of Systems    PHYSICAL EXAMINATION:  BP 108/71 (BP Site: Left arm, Patient Position: Sitting, Cuff Size: Small)   Pulse 91   Temp 99.7 F (37.6 C) (Oral)   Wt 71.8 kg (158 lb 6.4 oz)   SpO2 98%   BMI 20.90 kg/m     Body mass index is 20.9 kg/m.    Physical Exam   Constitutional: He appears well-developed and well-nourished. No distress.   HENT:   Head: Normocephalic and atraumatic.   Right Ear: External ear normal.   Left Ear: External ear normal.   Mouth/Throat: Oropharyngeal exudate (white exudate on palatine tonsils) present.   Eyes: Pupils are equal, round, and reactive to light. Conjunctivae are normal. Right eye exhibits no discharge. Left eye exhibits no discharge. No scleral icterus.   Neck: Normal range of motion. Neck supple.   Cardiovascular: Normal rate, regular rhythm, normal heart sounds and intact distal pulses.    No murmur heard.  Pulmonary/Chest: Effort normal and breath sounds normal. No respiratory distress. He has no wheezes.   Lymphadenopathy:     He has cervical adenopathy (anterior chain cervical LAD).   Neurological: He is alert.   Skin: Skin is warm. No rash noted. He is diaphoretic.   Psychiatric: He has a normal mood and affect. His behavior is normal.   Vitals reviewed.       ASSESSMENT/PLAN:  1. Sore throat  Rapid strep neg but Centor criteria met (except no recent fever), so will treat   - POCT Rapid Group A Strep  - Throat Culture  - azithromycin (ZITHROMAX) 250 MG tablet; 2 tabs po on day  1, then 1 tab po qd until complete  Dispense: 6 tablet; Refill: 0        Patient Instructions     Pharyngitis: Strep (Confirmed)      You have had a positive test for strep throat. Strep throat is a contagious illness. It is spread by coughing, kissing or by touching others after touching your mouth or nose. Symptoms include throat pain which is worse with swallowing, aching all over, headache and fever. It is treated with antibiotic medication. This should help you start to feel better within 1-2 days.  Home care   Rest at home. Drink plenty of fluids to avoid dehydration.   No work or school for the first 2 days of taking the antibiotics. After this time, you will not be contagious. You can then return to school or work if you are feeling better.   The antibiotic medication must be taken for the full 10 days, even if you feel better. This is very important to ensure the infection is treated.It is also important to prevent drug-resistent organisms from developing.If you were given an antibiotic shot, no more antibiotics are needed.   You may use acetaminophen (Tylenol) or ibuprofen (Motrin, Advil) to control pain or fever, unless another medicine was prescribed for this. (NOTE: If you have chronic liver or kidney disease or ever had a stomach ulcer or GI bleeding, talk with your doctor before using these medicines.)   Throat lozenges or sprays (such as Chloraseptic) help reduce pain. Gargling with warm salt water will also reduce throat pain. Dissolve 1/2 teaspoon of salt in 1 glass of warm water. This may be useful just before meals.   Soft foods are okay. Avoid salty or spicy foods.  Follow-up care  Follow up with your healthcare provider or our staff if you are not improving over the next week.  When to seek medical advice  Call your healthcare provider right away if any of these occur:   Feveras directed by your doctor   New or worsening ear pain, sinus pain, or headache   Painful lumps in the  back of neck   Stiff neck   Lymph nodes are getting larger or becoming soft in the middle   Inability to swallow liquids, excessive drooling,or inability to open mouth wide due to throat pain   Signs of dehydration (very dark urine or no urine, sunken eyes, dizziness)   Trouble breathing or noisy breathing   Muffled voice   New rash  Date Last Reviewed: 05/31/2013   2000-2016 [employer] [address], [place], [place] [address]. All rights reserved. This information is not intended as a substitute for professional medical care. Always follow your healthcare professional's instructions.        Azithromycin tablets  Brand Names: Zithromax, Zithromax Tri-Pak, Zithromax Z-Pak  What is this medicine?  AZITHROMYCIN (az ith roe MYE sin) is a macrolide antibiotic. It is used to treat or prevent certain kinds of bacterial infections. It will not work for colds, flu, or other viral infections.  How should I use this medicine?  Take this medicine by mouth with a full glass of water. Follow the directions on the prescription label. The tablets can be taken with food or on an empty stomach. If the medicine upsets your stomach, take it with food. Take your medicine at regular intervals. Do not take your medicine more often than directed. Take all of your medicine as directed even if you think your are better. Do not skip doses or stop your medicine early.  Talk to your pediatrician regarding the use of this medicine in children. While this drug may be prescribed for children as young as 6 months for selected conditions, precautions do apply.  What side effects may I notice from receiving this medicine?  Side effects that you should report to your doctor or health care professional as soon as possible:   allergic reactions like skin rash, itching or hives, swelling of the face, lips, or tongue   confusion, nightmares or hallucinations   dark urine   difficulty breathing   hearing loss   irregular  heartbeat or chest pain   pain or difficulty passing urine   redness, blistering, peeling or loosening of the skin, including inside the mouth   white patches or sores in the mouth   yellowing of the eyes or skin  Side effects that usually do not require medical attention (report to your doctor or health care professional if they continue or are bothersome):   diarrhea   dizziness, drowsiness   headache   stomach upset or vomiting   tooth discoloration   vaginal irritation  What may interact with this medicine?  Do not take this medicine with any of the following medications:   lincomycin  This medicine may also interact with the following medications:   amiodarone   antacids   birth control pills   cyclosporine   digoxin   magnesium   nelfinavir   phenytoin   warfarin  What if I miss a dose?  If you miss a dose, take it as soon as you can. If it is almost time for your next dose, take only that dose. Do not take double or extra doses.  Where should I keep my medicine?  Keep out of the reach of children.  Store at room temperature between 15 and 30 degrees  C (59 and 86 degrees F). Throw away any unused medicine after the expiration date.  What should I tell my health care provider before I take this medicine?  They need to know if you have any of these conditions:   kidney disease   liver disease   irregular heartbeat or heart disease   an unusual or allergic reaction to azithromycin, erythromycin, other macrolide antibiotics, foods, dyes, or preservatives   pregnant or trying to get pregnant   breast-feeding  What should I watch for while using this medicine?  Tell your doctor or healthcare professional if your symptoms do not start to get better or if they get worse.  Do not treat diarrhea with over the counter products. Contact your doctor if you have diarrhea that lasts more than 2 days or if it is severe and watery.  This medicine can make you more sensitive to the sun. Keep out of the  sun. If you cannot avoid being in the sun, wear protective clothing and use sunscreen. Do not use sun lamps or tanning beds/booths.  NOTE:This sheet is a summary. It may not cover all possible information. If you have questions about this medicine, talk to your doctor, pharmacist, or health care provider. Copyright 2018 Elsevier            The patient indicates understanding of these issues and agrees with the plan.    Risk & Benefits of any new medication(s) were explained to the patient who verbalized understanding.     Call if symptoms persist, worsen, or change.  Call with updates/questions/concerns.      Follow up with  Embrie Mikkelsen, Rolin Barry, MD in Return for As needed.

## 2016-10-28 NOTE — Patient Instructions (Signed)
Pharyngitis: Strep (Confirmed)      You have had a positive test for strep throat. Strep throat is a contagious illness. It is spread by coughing, kissing or by touching others after touching your mouth or nose. Symptoms include throat pain which is worse with swallowing, aching all over, headache and fever. It is treated with antibiotic medication. This should help you start to feel better within 1-2 days.  Home care   Rest at home. Drink plenty of fluids to avoid dehydration.   No work or school for the first 2 days of taking the antibiotics. After this time, you will not be contagious. You can then return to school or work if you are feeling better.   The antibiotic medication must be taken for the full 10 days, even if you feel better. This is very important to ensure the infection is treated.It is also important to prevent drug-resistent organisms from developing.If you were given an antibiotic shot, no more antibiotics are needed.   You may use acetaminophen (Tylenol) or ibuprofen (Motrin, Advil) to control pain or fever, unless another medicine was prescribed for this. (NOTE: If you have chronic liver or kidney disease or ever had a stomach ulcer or GI bleeding, talk with your doctor before using these medicines.)   Throat lozenges or sprays (such as Chloraseptic) help reduce pain. Gargling with warm salt water will also reduce throat pain. Dissolve 1/2 teaspoon of salt in 1 glass of warm water. This may be useful just before meals.   Soft foods are okay. Avoid salty or spicy foods.  Follow-up care  Follow up with your healthcare provider or our staff if you are not improving over the next week.  When to seek medical advice  Call your healthcare provider right away if any of these occur:   Feveras directed by your doctor   New or worsening ear pain, sinus pain, or headache   Painful lumps in the back of neck   Stiff neck   Lymph nodes are getting larger or becoming soft in the  middle   Inability to swallow liquids, excessive drooling,or inability to open mouth wide due to throat pain   Signs of dehydration (very dark urine or no urine, sunken eyes, dizziness)   Trouble breathing or noisy breathing   Muffled voice   New rash  Date Last Reviewed: 05/31/2013   2000-2016 [employer]. [address], [place] [address]. All rights reserved. This information is not intended as a substitute for professional medical care. Always follow your healthcare professional's instructions.        Azithromycin tablets  Brand Names: Zithromax, Zithromax Tri-Pak, Zithromax Z-Pak  What is this medicine?  AZITHROMYCIN (az ith roe MYE sin) is a macrolide antibiotic. It is used to treat or prevent certain kinds of bacterial infections. It will not work for colds, flu, or other viral infections.  How should I use this medicine?  Take this medicine by mouth with a full glass of water. Follow the directions on the prescription label. The tablets can be taken with food or on an empty stomach. If the medicine upsets your stomach, take it with food. Take your medicine at regular intervals. Do not take your medicine more often than directed. Take all of your medicine as directed even if you think your are better. Do not skip doses or stop your medicine early.  Talk to your pediatrician regarding the use of this medicine in children. While this drug may   be prescribed for children as young as 6 months for selected conditions, precautions do apply.  What side effects may I notice from receiving this medicine?  Side effects that you should report to your doctor or health care professional as soon as possible:   allergic reactions like skin rash, itching or hives, swelling of the face, lips, or tongue   confusion, nightmares or hallucinations   dark urine   difficulty breathing   hearing loss   irregular heartbeat or chest pain   pain or difficulty passing urine   redness, blistering,  peeling or loosening of the skin, including inside the mouth   white patches or sores in the mouth   yellowing of the eyes or skin  Side effects that usually do not require medical attention (report to your doctor or health care professional if they continue or are bothersome):   diarrhea   dizziness, drowsiness   headache   stomach upset or vomiting   tooth discoloration   vaginal irritation  What may interact with this medicine?  Do not take this medicine with any of the following medications:   lincomycin  This medicine may also interact with the following medications:   amiodarone   antacids   birth control pills   cyclosporine   digoxin   magnesium   nelfinavir   phenytoin   warfarin  What if I miss a dose?  If you miss a dose, take it as soon as you can. If it is almost time for your next dose, take only that dose. Do not take double or extra doses.  Where should I keep my medicine?  Keep out of the reach of children.  Store at room temperature between 15 and 30 degrees C (59 and 86 degrees F). Throw away any unused medicine after the expiration date.  What should I tell my health care provider before I take this medicine?  They need to know if you have any of these conditions:   kidney disease   liver disease   irregular heartbeat or heart disease   an unusual or allergic reaction to azithromycin, erythromycin, other macrolide antibiotics, foods, dyes, or preservatives   pregnant or trying to get pregnant   breast-feeding  What should I watch for while using this medicine?  Tell your doctor or healthcare professional if your symptoms do not start to get better or if they get worse.  Do not treat diarrhea with over the counter products. Contact your doctor if you have diarrhea that lasts more than 2 days or if it is severe and watery.  This medicine can make you more sensitive to the sun. Keep out of the sun. If you cannot avoid being in the sun, wear protective clothing and use  sunscreen. Do not use sun lamps or tanning beds/booths.  NOTE:This sheet is a summary. It may not cover all possible information. If you have questions about this medicine, talk to your doctor, pharmacist, or health care provider. Copyright 2018 Elsevier

## 2016-10-30 ENCOUNTER — Telehealth (INDEPENDENT_AMBULATORY_CARE_PROVIDER_SITE_OTHER): Payer: Self-pay | Admitting: Internal Medicine

## 2016-10-30 DIAGNOSIS — J020 Streptococcal pharyngitis: Secondary | ICD-10-CM

## 2016-10-30 MED ORDER — AMOXICILLIN-POT CLAVULANATE 875-125 MG PO TABS
1.0000 | ORAL_TABLET | Freq: Two times a day (BID) | ORAL | 0 refills | Status: AC
Start: 2016-10-30 — End: 2016-11-06

## 2016-10-30 NOTE — Telephone Encounter (Signed)
Called patient back   Left VM with CB number on home number    Tried cell phone number  Mailbox is full, unable to leave VM  Will try again later

## 2016-10-30 NOTE — Telephone Encounter (Signed)
Called patient   His temperature went up to 101F  Taking antibiotic  Throat is still quite sore     Will switch to Augmentin BID x 7 days   Throat culture still pending   He will call back if not improving

## 2016-10-30 NOTE — Telephone Encounter (Signed)
Pt was seen on Monday 10/28/16 and is still having symptoms. Pt can be reached at [phone]

## 2016-12-01 ENCOUNTER — Emergency Department
Admission: EM | Admit: 2016-12-01 | Discharge: 2016-12-01 | Disposition: A | Discharge status: Home / Self Care | Payer: Federal, State, Local not specified - Other | Attending: Student in an Organized Health Care Education/Training Program | Admitting: Student in an Organized Health Care Education/Training Program

## 2016-12-01 ENCOUNTER — Emergency Department: Payer: Federal, State, Local not specified - Other

## 2016-12-01 DIAGNOSIS — Y92149 Unspecified place in prison as the place of occurrence of the external cause: Secondary | ICD-10-CM | POA: Insufficient documentation

## 2016-12-01 DIAGNOSIS — S0990XA Unspecified injury of head, initial encounter: Secondary | ICD-10-CM | POA: Insufficient documentation

## 2016-12-01 DIAGNOSIS — W19XXXA Unspecified fall, initial encounter: Secondary | ICD-10-CM | POA: Insufficient documentation

## 2016-12-01 DIAGNOSIS — R569 Unspecified convulsions: Secondary | ICD-10-CM | POA: Insufficient documentation

## 2016-12-01 LAB — BASIC METABOLIC PANEL
Anion Gap: 21 — ABNORMAL HIGH (ref 5.0–15.0)
BUN: 9 mg/dL (ref 9–28)
CO2: 12 mEq/L — ABNORMAL LOW (ref 22–29)
Calcium: 9.7 mg/dL (ref 8.5–10.5)
Chloride: 107 mEq/L (ref 100–111)
Creatinine: 1 mg/dL (ref 0.7–1.3)
Glucose: 168 mg/dL — ABNORMAL HIGH (ref 70–100)
Potassium: 3.3 mEq/L — ABNORMAL LOW (ref 3.5–5.1)
Sodium: 140 mEq/L (ref 136–145)

## 2016-12-01 LAB — CBC AND DIFFERENTIAL
Absolute NRBC: 0 10*3/uL
Basophils Absolute Automated: 0.12 10*3/uL (ref 0.00–0.20)
Basophils Automated: 1 %
Eosinophils Absolute Automated: 0.22 10*3/uL (ref 0.00–0.70)
Eosinophils Automated: 1.9 %
Hematocrit: 42.1 % (ref 42.0–52.0)
Hgb: 14.4 g/dL (ref 13.0–17.0)
Immature Granulocytes Absolute: 0.04 10*3/uL
Immature Granulocytes: 0.3 %
Lymphocytes Absolute Automated: 5.57 10*3/uL — ABNORMAL HIGH (ref 0.50–4.40)
Lymphocytes Automated: 48.2 %
MCH: 28.9 pg (ref 28.0–32.0)
MCHC: 34.2 g/dL (ref 32.0–36.0)
MCV: 84.5 fL (ref 80.0–100.0)
MPV: 10.1 fL (ref 9.4–12.3)
Monocytes Absolute Automated: 0.65 10*3/uL (ref 0.00–1.20)
Monocytes: 5.6 %
Neutrophils Absolute: 4.96 10*3/uL (ref 1.80–8.10)
Neutrophils: 43 %
Nucleated RBC: 0 /100 WBC (ref 0.0–1.0)
Platelets: 281 10*3/uL (ref 140–400)
RBC: 4.98 10*6/uL (ref 4.70–6.00)
RDW: 13 % (ref 12–15)
WBC: 11.56 10*3/uL — ABNORMAL HIGH (ref 3.50–10.80)

## 2016-12-01 LAB — ECG 12-LEAD
Atrial Rate: 93 {beats}/min
P Axis: 75 degrees
P-R Interval: 124 ms
Q-T Interval: 384 ms
QRS Duration: 84 ms
QTC Calculation (Bezet): 477 ms
R Axis: 82 degrees
T Axis: 69 degrees
Ventricular Rate: 93 {beats}/min

## 2016-12-01 LAB — GFR: EGFR: >60

## 2016-12-01 MED ORDER — SODIUM CHLORIDE 0.9 % IV BOLUS
1000.0000 mL | Freq: Once | INTRAVENOUS | Status: AC
Start: 2016-12-01 — End: 2016-12-01
  Administered 2016-12-01: 01:00:00 1000 mL via INTRAVENOUS

## 2016-12-01 MED ORDER — KETOROLAC TROMETHAMINE 15 MG/ML IJ SOLN
15.0000 mg | Freq: Once | INTRAMUSCULAR | Status: AC
Start: 2016-12-01 — End: 2016-12-01
  Administered 2016-12-01: 03:00:00 15 mg via INTRAVENOUS
  Filled 2016-12-01: qty 1

## 2016-12-01 MED ORDER — LEVETIRACETAM 250 MG PO TABS
500.0000 mg | ORAL_TABLET | Freq: Two times a day (BID) | ORAL | 0 refills | Status: DC
Start: 2016-12-01 — End: 2016-12-01

## 2016-12-01 MED ORDER — ONDANSETRON HCL 4 MG/2ML IJ SOLN
4.0000 mg | Freq: Once | INTRAMUSCULAR | Status: AC
Start: 2016-12-01 — End: 2016-12-01
  Administered 2016-12-01: 02:00:00 4 mg via INTRAVENOUS
  Filled 2016-12-01: qty 2

## 2016-12-01 MED ORDER — SODIUM CHLORIDE 0.9 % IV SOLN
1000.0000 mg | Freq: Once | INTRAVENOUS | Status: AC
Start: 2016-12-01 — End: 2016-12-01
  Administered 2016-12-01: 03:00:00 1000 mg via INTRAVENOUS
  Filled 2016-12-01: qty 10

## 2016-12-01 MED ORDER — ONDANSETRON HCL 4 MG/2ML IJ SOLN
4.0000 mg | Freq: Once | INTRAMUSCULAR | Status: AC
Start: 2016-12-01 — End: 2016-12-01
  Administered 2016-12-01: 01:00:00 4 mg via INTRAVENOUS
  Filled 2016-12-01: qty 2

## 2016-12-01 MED ORDER — TETANUS-DIPHTH-ACELL PERTUSSIS 5-2.5-18.5 LF-MCG/0.5 IM SUSP
0.5000 mL | Freq: Once | INTRAMUSCULAR | Status: AC
Start: 2016-12-01 — End: 2016-12-01
  Administered 2016-12-01: 03:00:00 0.5 mL via INTRAMUSCULAR
  Filled 2016-12-01: qty 0.5

## 2016-12-01 MED ORDER — LEVETIRACETAM 500 MG PO TABS
500.0000 mg | ORAL_TABLET | Freq: Two times a day (BID) | ORAL | 0 refills | Status: DC
Start: 2016-12-01 — End: 2018-04-14

## 2016-12-01 MED ORDER — SODIUM CHLORIDE 0.9 % IV BOLUS
1000.0000 mL | Freq: Once | INTRAVENOUS | Status: AC
Start: 2016-12-01 — End: 2016-12-01
  Administered 2016-12-01: 02:00:00 1000 mL via INTRAVENOUS

## 2016-12-01 NOTE — ED Triage Notes (Signed)
[facility] from [facility]. Nurse at jail witness seizure and fall and head injury. Pt also complaining of nausea and headache prior to seizure. Also patient has a history of seizures.

## 2016-12-01 NOTE — Discharge Instructions (Signed)
Dear Georg Ruddle:    You were evaluated in the Emergency Department for seizure.  Your labs, face/head/neck CT shows no acute abnormalities.  You are being started on Keppra at this time.  Please follow up with Neurology in 2-3 days.  Follow up with primary care physician in 1-2 days.    Take Ibuprofen 400mg  every 6 hours or Tylenol 650mg  every 6 hours as needed for pain.  Take these medications with food. You can alternate these medications every 4 hours.     The examination and treatment you have received today has been on an emergency basis only and has not been intended to substitute for complete medical care.  Since it is impossible to recognize and treat all elements of your injury or illness in a single visit, for your protection in preventing possible complications, if you are not clearly improving in 1-2 days, you should be seen for a re-check at the ER or with your primary care physician.        Instructions:    Continue home medications as prescribed.    Return to the ER for fevers, severe pain, intractable vomiting, chest pain, shortness of breath, worsening of conditions or any concerns.    Thank you for choosing [facility] for your medical care.    Sincerely,  Rosamaria Lints, MD  [facility]    ________________________________________________________________    Thank you for choosing [facility] for your emergency care needs.  We strive to provide EXCELLENT care to you and your family.      IF YOU DO NOT CONTINUE TO IMPROVE OR YOUR CONDITION WORSENS, PLEASE CONTACT YOUR DOCTOR OR RETURN IMMEDIATELY TO THE EMERGENCY DEPARTMENT.    ONSITE PHARMACY  Our full service onsite pharmacy is a 2 minute walk from the ER.  Open Mon to Fri from 8 am to 8 pm, Sat  9 am to 5 pm. Ask an ED staff member for directions.  We accept all major insurances and prices are competitive with major retailers.  Ask your provider to print your prescriptions down to the  pharmacy to speed you on your way home.    OBTAINING A PRIMARY CARE APPOINTMENT    Primary care physicians (PCPs, also known as primary care doctors) are either internists or family medicine doctors. Both types of PCPs focus on health promotion, disease prevention, patient education and counseling, and treatment of acute and chronic medical conditions.    Call for an appointment with a primary care doctor.  Ask to see who is taking new patients. Two options are below.    Both groups have offices near [place] and in the [place] area.    [facility]  telephone:  [phone]  [email or web address]    [facility]  telephone:  [phone]  [email or web address]      DOCTOR REFERRALS  Call [phone] (available 24 hours a day, 7 days a week) if you need any further referrals and we can help you find a primary care doctor or specialist.  Also, available online at:  [email or web address]      YOUR CONTACT INFORMATION  Before leaving please check with registration to make sure we have an up-to-date contact number.  You can call registration at [phone] to update your information.  For questions about your hospital bill, please call [phone].  For questions about your Emergency Dept Physician bill please call [phone].  FREE HEALTH SERVICES  If you need help with health or social services, please call 2-1-1 for a free referral to resources in your area.  2-1-1 is a free service connecting people with information on health insurance, free clinics, pregnancy, mental health, dental care, food assistance, housing, and substance abuse counseling.  Also, available online at:  http://www.[facility].org    MEDICAL RECORDS AND TESTS  Certain laboratory test results do not come back the same day, for example urine cultures.   We will contact you if other important findings are noted.  Radiology films are often reviewed again to ensure accuracy.  If  there is any discrepancy, we will notify you.      Please call [phone] to pick up a complimentary CD of any radiology studies performed.  If you or your doctor would like to request a copy of your medical records, please call [phone].      ORTHOPEDIC INJURY   Please know that significant injuries can exist even when an initial x-ray is read as normal or negative.  This can occur because some fractures (broken bones) are not initially visible on x-rays.  For this reason, close outpatient follow-up with your primary care doctor or bone specialist (orthopedist) is required.    MEDICATIONS AND FOLLOWUP  Please be aware that some prescription medications can cause drowsiness.  Use caution when driving or operating machinery.    The examination and treatment you have received in our Emergency Department is provided on an emergency basis, and is not intended to be a substitute for your primary care physician.  It is important that your doctor checks you again and that you report any new or remaining problems at that time.      24 HOUR PHARMACIES  [facility] [address] [place], [place] [address] (1.4 miles, 7 minutes)  [facility] - [address] [place], [place] [address] (6.5 miles, 13 minutes)  Handout with directions available on request        ASSISTANCE WITH INSURANCE    Affordable Care Act  [facility])  Call to start or finish an application, compare plans, enroll or ask a question.  [phone]  TTY: [phone]  Web:  Healthcare.gov    Help Enrolling in [facility]  [employer] [place]  (678)161-9853 (TOLL-FREE)  [phone] (TTY)  Web:  Http://www.coverva.org    Local Help Enrolling in the [facility]  [place] [facility]  [phone] (MAIN)  Email:  health-help@nvfs .org  Web:  BlackjackMyths.is  Address:  [address], [address] [place], [place] [address]

## 2016-12-01 NOTE — ED Provider Notes (Signed)
Physician/Midlevel provider first contact with patient: 12/01/16 2956         EMERGENCY DEPARTMENT HISTORY AND PHYSICAL EXAM    Patient Name: Craig Phelps, Craig Phelps  Encounter Date:  12/01/2016  Attending Physician: Verdene Rio, MD  Patient DOB:  December 16, 1995  MRN:  [id number]  Room:  09/A09    History of Presenting Illness     Historian: Patient and police    21 y.o. male h/o ADHD and seizures p/w sudden onset of seizure (duration of ~1 min) onset today that was witnessed by nurse while pt was in jail co facial pain.  Pt with seizure x 1 minute that resolved without intervention.  Pt amnestic to event.  Pt reports nausea and mild frontal headache. Pt states not sleeping or eating well for the past 2-3 days which are known triggers for seizures.  Pt is not on medications for seizures at this time.  Pt states prophylaxis by avoiding known triggers. Last seizure fall 2017. Denies use of illicit drugs and alcohol. Denies fevers, chills, CP, SOB, and abd pain.    Neurologist: Dr. Laveda Norman  PMD:  Macie Burows Rolin Barry, MD    Past Medical History     Past Medical History:   Diagnosis Date   . ADHD (attention deficit hyperactivity disorder)     took Adderall in highschool and college but sopped   . ENT disease     Pollen allergies/sneezing   . Fractures     rt wrist x 2   . GERD (gastroesophageal reflux disease)     occ/prilosec OTC   . H1N1 influenza 2009   . Pilonidal cyst 2014   . Seizure        Past Surgical History     Past Surgical History:   Procedure Laterality Date   . CYSTECTOMY, PILONIDAL  08/24/2012    Procedure: CYSTECTOMY, PILONIDAL;  Surgeon: Despina Hidden, MD;  Location: [facility];  Service: General;  Laterality: N/A;   . WISDOM TOOTH EXTRACTION  2014       Family History     Family History   Problem Relation Age of Onset   . ADD / ADHD Father    . ADD / ADHD Brother    . Cancer Paternal Grandfather 39        bladder, smoker   . Pulmonary fibrosis Maternal Grandmother         also with ITP   . Alzheimer's disease  Paternal Grandmother 37       Social History     Social History     Social History   . Marital status: Single     Spouse name: N/A   . Number of children: 0   . Years of education: N/A     Occupational History   . College Student      Social History Main Topics   . Smoking status: Never Smoker   . Smokeless tobacco: Never Used   . Alcohol use 0.0 oz/week   . Drug use: No   . Sexual activity: Yes     Partners: Female      Comment: monogamous     Other Topics Concern   . Not on file     Social History Narrative    Taking a break from school    Wanting to transfer for the fall    Working at [employer]        Exercise: no     Diet: nothing special, tries to  avoid GERD inducing foods       Home Medications     Home medications reviewed by ED MD    Previous Medications    No medications on file       Review of Systems     CV: No CP  Resp: No SOB  GI: +Nausea. No abd pain  Skin: +Facial abrasions  Neuro: +Seizure, +HA  All other systems reviewed and negative    Physical Exam     BP 108/67   Pulse 60   Temp 97.7 F (36.5 C) (Oral)   Resp 20   SpO2 97%     Physical Exam   Constitutional: Patient is oriented to person, place, and time. Appears well-developed and well-nourished. No distress.   HENT:   Head: Normocephalic. 3 cm hematoma on his R upper forehead with ecchymosis.  Right Ear: External ear normal.   Left Ear: External ear normal.   Nose: Nose normal.   Mouth/Throat: Oropharynx is clear and moist.   Eyes: Conjunctivae and EOM are normal. Pupils are equal, round, and reactive to light.   Neck: Neck supple. No JVD present. No tracheal deviation present.   Cardiovascular: Normal rate, regular rhythm and normal heart sounds.  Exam reveals no gallop and no friction rub.    No murmur heard.  Pulmonary/Chest: No respiratory distress. No wheezes. No rales.   Abdominal: Soft. No distension. There is no tenderness. There is no rebound and no guarding.   Musculoskeletal: Normal range of motion. Patient exhibits no edema.    Neurological: Alert and oriented to person, place, and time. No cranial nerve deficit.   Mental status: oriented, alert, lucid, cooperative, appropriate.  Cranial nerves: EOMI, PERRL, V1/V2/V3 intact to fine touch, facial muscles intact, uvula and palate midline, CN11 intact.  Motor: 5+ UE and LE, flexors and extensors symmetric.  Sensation: Grossly intact to fine touch UE and LE sx.  Cerebellar: No tremor noted.  Tone: normal bulk and tone in upper and lower extremities. No atrophy noted.  Skin: Skin is warm. No rash noted. Abrasion on his nose.   Psychiatric: Patient has a normal mood and affect. Behavior is normal.   Nursing note and vitals reviewed.    ED Medications Administered     ED Medication Orders     Start Ordered     Status Ordering Provider    12/01/16 0314 12/01/16 0313  levETIRAcetam (KEPPRA) 1,000 mg in sodium chloride 0.9 % 100 mL IVPB  Once     Route: Intravenous  Ordered Dose: 1,000 mg     Acknowledged Verdene Rio J    12/01/16 0301 12/01/16 0300  ketorolac (TORADOL) injection 15 mg  Once     Route: Intravenous  Ordered Dose: 15 mg     Last MAR action:  Given Rosamaria Lints    12/01/16 0143 12/01/16 0142  ondansetron (ZOFRAN) injection 4 mg  Once     Route: Intravenous  Ordered Dose: 4 mg     Last MAR action:  Given Rosamaria Lints    12/01/16 0143 12/01/16 0142  sodium chloride 0.9 % bolus 1,000 mL  Once     Route: Intravenous  Ordered Dose: 1,000 mL     Last MAR action:  Stopped Laranda Burkemper J    12/01/16 0100 12/01/16 0059  ondansetron (ZOFRAN) injection 4 mg  Once     Route: Intravenous  Ordered Dose: 4 mg     Last MAR action:  Given Kiowa Peifer J  12/01/16 0059 12/01/16 0058  sodium chloride 0.9 % bolus 1,000 mL  Once     Route: Intravenous  Ordered Dose: 1,000 mL     Last MAR action:  Wyline Beady    12/01/16 0058 12/01/16 0057  tetanus-diphth-acell pertussis (BOOSTRIX) injection 0.5 mL  Once     Route: Intramuscular  Ordered Dose: 0.5 mL     Last MAR action:  Given  Lissandro Dilorenzo J          Orders Placed During This Encounter     Orders Placed This Encounter   Procedures   . CT Head without Contrast   . CT Cervical Spine without Contrast   . CT Sinus Facial Bones without Contrast   . CBC with Differential   . Basic Metabolic Panel (BMP)   . GFR       Diagnostic Study Results     The results of the diagnostic studies below were reviewed by the ED provider:    Labs  Results     Procedure Component Value Units Date/Time    Basic Metabolic Panel (BMP) [[id number]]  (Abnormal) Collected:  12/01/16 0029    Specimen:  Blood Updated:  12/01/16 0056     Glucose 168 (H) mg/dL      BUN 9 mg/dL      Creatinine 1.0 mg/dL      Calcium 9.7 mg/dL      Sodium 409 mEq/L      Potassium 3.3 (L) mEq/L      Chloride 107 mEq/L      CO2 12 (L) mEq/L      Anion Gap 21.0 (H)    GFR [[id number]] Collected:  12/01/16 0029     Updated:  12/01/16 0056     EGFR >60.0    CBC with Differential [[id number]]  (Abnormal) Collected:  12/01/16 0029    Specimen:  Blood from Blood Updated:  12/01/16 0037     WBC 11.56 (H) x10 3/uL      Hgb 14.4 g/dL      Hematocrit 57.8 %      Platelets 281 x10 3/uL      RBC 4.98 x10 6/uL      MCV 84.5 fL      MCH 28.9 pg      MCHC 34.2 g/dL      RDW 13 %      MPV 10.1 fL      Neutrophils 43.0 %      Lymphocytes Automated 48.2 %      Monocytes 5.6 %      Eosinophils Automated 1.9 %      Basophils Automated 1.0 %      Immature Granulocyte 0.3 %      Nucleated RBC 0.0 /100 WBC      Neutrophils Absolute 4.96 x10 3/uL      Abs Lymph Automated 5.57 (H) x10 3/uL      Abs Mono Automated 0.65 x10 3/uL      Abs Eos Automated 0.22 x10 3/uL      Absolute Baso Automated 0.12 x10 3/uL      Absolute Immature Granulocyte 0.04 x10 3/uL      Absolute NRBC 0.00 x10 3/uL           Radiologic Studies  Radiology Results (24 Hour)     Procedure Component Value Units Date/Time    CT Sinus Facial Bones without Contrast [[id number]] Collected:  12/01/16 0251    Order Status:  Completed Updated:  12/01/16 0258     Narrative:       TECHNIQUE: CT of the facial bones and orbits WITHOUT contrast.  Sagittal  and coronal reformatted images were obtained.     The following dose reduction techniques were utilized: Automated  exposure control and/or adjustment of the mA and/or kV according to  patient size, and the use of iterative reconstruction technique.    INDICATION: Trauma    COMPARISON:  No relevant prior examination available for comparison.     FINDINGS:     No acute fracture or malalignment.    Mucous retention cysts in the left maxillary sinus. Paranasal sinuses  otherwise clear.    Swelling of the soft tissues overlying the calvarium. No underlying  fracture.    No globe deformity. No retrobulbar hematoma.      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    Swelling of the soft tissues overlying the calvarium. No underlying  fracture.    Johnsie Kindred, MD   12/01/2016 2:54 AM    CT Cervical Spine without Contrast [[id number]] Collected:  12/01/16 0233    Order Status:  Completed Updated:  12/01/16 0242    Narrative:       TECHNIQUE: CT of the cervical spine WITHOUT contrast.  Sagittal and  coronal reformatted images were obtained.    The following dose reduction techniques were utilized: Automated  exposure control and/or adjustment of the mA and/or kV according to  patient size, and the use of iterative reconstruction technique.    INDICATION: Trauma    COMPARISON:  No relevant prior examination available for comparison.    FINDINGS:     No acute fracture or malalignment.    No prevertebral soft tissue swelling.    No significant stenosis of the central spinal canal or neural foramina.      Impression:          No acute fracture or malalignment.    Johnsie Kindred, MD   12/01/2016 2:38 AM    CT Head without Contrast [[id number]] Collected:  12/01/16 0203    Order Status:  Completed Updated:  12/01/16 0210    Narrative:       TECHNIQUE: CT of the head without intravenous contrast.     The following dose  reduction techniques were utilized: Automated  exposure control and/or adjustment of the mA and/or kV according to  patient size, and the use of iterative reconstruction technique.    INDICATION: Seizure    COMPARISON: No relevant prior examination available for comparison.    FINDINGS:     No acute territorial infarct, intracranial hemorrhage, mass effect,  midline shift, tonsillar herniation or extra-axial fluid collections.          Ventricles and basilar cisterns are intact.    Visualized paranasal sinuses, mastoid air cells, bones and soft tissues  are unremarkable.      Impression:         No acute intracranial abnormality.    Johnsie Kindred, MD   12/01/2016 2:06 AM          Scribe and MD Attestations     I, Verdene Rio, MD, personally performed the services documented. Alva Garnet is scribing for me on [place],Lasean J. I reviewed and confirm the accuracy of the information in this medical record.    I, Alva Garnet, am serving as a Neurosurgeon to document services personally performed by Marylene Land  Manson Passey, MD, based on the provider's statements to me.     Credentials: Alva Garnet, scribe    Rendering Provider: Verdene Rio, MD    Monitors, EKG, Critical Care, and Splints     EKG (interpreted by ED physician):  Cardiac Monitor (interpreted by ED physician):       Critical Care:   Splint check:      MDM and Clinical Notes     MDM:  Eval for ICH vs mass vs fx.  Eval for electrolyte abnl/anemia.  Labs, meds, IVF, CT    2:00AM  Pt lying comfortably in bed.    3:05 AM  Pt reports feeling improved. Extensive d/w pt regarding results and importance of seizure meds. Pt agrees to meds at this time. Comfortable with d/c to home.  Return precautions discussed.  Will follow up with PMD    3:15 AM  D/w Dr. Adline Mango, neurologist, who agrees with Keppra loading pt and recommends Keppra 500 PO BID.  Will follow up as outpatient.     Diagnosis and Disposition     Clinical Impression  1. Seizure    2. Closed head injury,  initial encounter        Disposition  ED Disposition     ED Disposition Condition Date/Time Comment    Discharge  Sun Dec 01, 2016  3:18 AM Georg Ruddle discharge to home/self care.    Condition at disposition: Stable          Prescriptions       New Prescriptions    LEVETIRACETAM (KEPPRA) 500 MG TABLET    Take 1 tablet (500 mg total) by mouth 2 (two) times daily.            Rosamaria Lints, MD  12/02/16 [phone]

## 2018-04-01 ENCOUNTER — Ambulatory Visit (INDEPENDENT_AMBULATORY_CARE_PROVIDER_SITE_OTHER): Payer: BLUE CROSS/BLUE SHIELD | Admitting: Internal Medicine

## 2018-04-08 ENCOUNTER — Ambulatory Visit (INDEPENDENT_AMBULATORY_CARE_PROVIDER_SITE_OTHER): Payer: Federal, State, Local not specified - Other | Admitting: Internal Medicine

## 2018-04-14 ENCOUNTER — Ambulatory Visit (INDEPENDENT_AMBULATORY_CARE_PROVIDER_SITE_OTHER): Payer: BLUE CROSS/BLUE SHIELD | Admitting: Internal Medicine

## 2018-04-14 ENCOUNTER — Encounter (INDEPENDENT_AMBULATORY_CARE_PROVIDER_SITE_OTHER): Payer: Self-pay | Admitting: Internal Medicine

## 2018-04-14 VITALS — BP 145/86 | HR 116 | Temp 99.1°F | Ht 72.0 in | Wt 154.4 lb

## 2018-04-14 DIAGNOSIS — R6889 Other general symptoms and signs: Secondary | ICD-10-CM

## 2018-04-14 DIAGNOSIS — K529 Noninfective gastroenteritis and colitis, unspecified: Secondary | ICD-10-CM

## 2018-04-14 LAB — POCT RAPID STREP A: Rapid Strep A Screen POCT: NEGATIVE

## 2018-04-14 LAB — POCT INFLUENZA A/B
POCT Rapid Influenza A AG: NEGATIVE
POCT Rapid Influenza B AG: NEGATIVE

## 2018-04-14 MED ORDER — ONDANSETRON 4 MG PO TBDP
4.0000 mg | ORAL_TABLET | Freq: Three times a day (TID) | ORAL | 0 refills | Status: AC | PRN
Start: 2018-04-14 — End: 2018-05-14

## 2018-04-14 NOTE — Progress Notes (Signed)
Subjective:      Date: 04/14/2018 4:52 PM   Patient ID: Craig Phelps is a 23 y.o. male.    Chief Complaint:  Chief Complaint   Patient presents with    Flu like symptoms     chill, fever, sore throat, vomitting, lower left abdominal pain       HPI:  Sore Throat    This is a new problem. The current episode started in the past 7 days. The problem has been gradually improving. Neither side of throat is experiencing more pain than the other. The maximum temperature recorded prior to his arrival was 100.4 - 100.9 F. The fever has been present for 1 to 2 days. The pain is at a severity of 5/10. The pain is moderate. Associated symptoms include abdominal pain, diarrhea and vomiting.   GI Problem   The primary symptoms include fever, abdominal pain, nausea, vomiting, diarrhea and myalgias. The illness began 2 days ago. The onset was sudden. The problem has been gradually improving.   Vomiting occurs 6 to 10 times per day. The emesis contains bilious material.   The diarrhea occurs 2 to 4 times per day.       Problem List:  Patient Active Problem List   Diagnosis    Pilonidal cyst    Seizure       Current Medications:  Outpatient Medications Marked as Taking for the 04/14/18 encounter (Office Visit) with Taylon Coole, MD   Medication Sig Dispense Refill    albuterol (PROVENTIL HFA;VENTOLIN HFA) 108 (90 Base) MCG/ACT inhaler as needed      fluticasone (FLONASE) 50 MCG/ACT nasal spray 1 spray by Nasal route daily           Allergies:  Allergies   Allergen Reactions    Other      Environmental allergies       Past Medical History:  Past Medical History:   Diagnosis Date    ADHD (attention deficit hyperactivity disorder)     took Adderall in highschool and college but sopped    ENT disease     Pollen allergies/sneezing    Fractures     rt wrist x 2    GERD (gastroesophageal reflux disease)     occ/prilosec OTC    H1N1 influenza 2009    Pilonidal cyst 2014    Seizure        Past Surgical History:  Past  Surgical History:   Procedure Laterality Date    CYSTECTOMY, PILONIDAL  08/24/2012    Procedure: CYSTECTOMY, PILONIDAL;  Surgeon: Despina Hidden, MD;  Location: [facility];  Service: General;  Laterality: N/A;    WISDOM TOOTH EXTRACTION  2014       Family History:  Family History   Problem Relation Age of Onset    ADD / ADHD Father     ADD / ADHD Brother     Cancer Paternal Grandfather 60        bladder, smoker    Pulmonary fibrosis Maternal Grandmother         also with ITP    Alzheimer's disease Paternal Grandmother 80       Social History:  Social History     Tobacco Use    Smoking status: Never Smoker    Smokeless tobacco: Never Used   Substance Use Topics    Alcohol use: Yes     Alcohol/week: 0.0 standard drinks    Drug use: No  The following sections were reviewed this encounter by the provider:   Tobacco   Allergies   Meds   Problems   Med Hx   Surg Hx   Fam Hx          ROS:  Review of Systems   Constitutional: Positive for fever.   Gastrointestinal: Positive for abdominal pain, diarrhea, nausea and vomiting.   Musculoskeletal: Positive for myalgias.   All other systems reviewed and are negative.       Objective:     Vitals:  BP 145/86 (BP Site: Right arm, Patient Position: Sitting, Cuff Size: Medium)    Pulse (!) 116    Temp 99.1 F (37.3 C)    Ht 1.829 m (6')    Wt 70 kg (154 lb 6.4 oz)    SpO2 95%    BMI 20.94 kg/m     Physical Exam:  Physical Exam  Vitals signs reviewed.   Constitutional:       General: He is not in acute distress.     Appearance: Normal appearance.   HENT:      Right Ear: Tympanic membrane and ear canal normal.      Left Ear: Tympanic membrane and ear canal normal.      Nose: No congestion.      Mouth/Throat:      Mouth: Mucous membranes are moist.      Pharynx: Oropharynx is clear. No oropharyngeal exudate or posterior oropharyngeal erythema.   Eyes:      Extraocular Movements: Extraocular movements intact.      Conjunctiva/sclera: Conjunctivae normal.       Pupils: Pupils are equal, round, and reactive to light.   Neck:      Musculoskeletal: Normal range of motion. No muscular tenderness.   Cardiovascular:      Rate and Rhythm: Normal rate and regular rhythm.      Heart sounds: Normal heart sounds.   Pulmonary:      Effort: Pulmonary effort is normal.      Breath sounds: Normal breath sounds. No wheezing, rhonchi or rales.   Musculoskeletal: Normal range of motion.         General: No swelling or tenderness.   Lymphadenopathy:      Cervical: No cervical adenopathy.   Skin:     General: Skin is warm and dry.      Findings: No rash.   Neurological:      General: No focal deficit present.      Mental Status: He is alert and oriented to person, place, and time.      Cranial Nerves: No cranial nerve deficit.   Psychiatric:         Mood and Affect: Mood normal.         Behavior: Behavior normal.         Judgment: Judgment normal.         Assessment/Plan:       1. Gastroenteritis, acute : viral , symptomatic treatment , including pushing fluids   Counseled on diet modification , BRAT diet and advance to reg diet as tolerated , can try probiotic as well  - ondansetron (ZOFRAN-ODT) 4 MG disintegrating tablet; Take 1 tablet (4 mg total) by mouth every 8 (eight) hours as needed for Nausea  Dispense: 20 tablet; Refill: 0    2. Flu-like symptoms  - POCT Influenza A/B : neg  - POCT Rapid Group A Strep : neg  Return if symptoms worsen or fail to improve.    Corby Vandenberghe, MD

## 2018-04-14 NOTE — Progress Notes (Signed)
Have you seen any specialists/other providers since your last visit with us?    Yes    Arm preference verified?   Yes    The patient is due for influenza vaccine

## 2018-04-14 NOTE — Patient Instructions (Signed)
1. Gastroenteritis, acute    Bland Diet  Your healthcare provider may recommend a bland dietif you have an upset stomach. It consists of foods that are mild and easy to digest. It is better to eat small frequent meals rather than3 large meals a day.    Beverages  OK: Fruit juices, non-caffeinated teas and coffee, non-carbonated waters  Avoid: Carbonated beverage, caffeinated tea and coffee, all alcoholic beverages  Bread  OK: Refined white, wheat or rye bread, graham or soda crackers, Melba toast, plain rolls, bagels  Avoid: Whole-grain bread  Cereal  OK: Refined cereals: cooked or ready to eat  Avoid: Whole-grain cereals and granola, or those containing bran, seeds or nuts  Desserts  OK: Peanut butter and all others except those to "avoid"  Avoid: Chocolate, cocoa, coconut, popcorn, nuts, seeds, jam, marmalade  Fruits  OK: Canned, cooked, frozen or fresh fruits without seeds or tough skin  Avoid: Olives, skin and seeds of fruit, dried fruit  Meats  OK: All fresh or preserved meat, fish and fowl  Avoid: Any that are prepared with those spices to "avoid"  Cheese and eggs  OK: Eggs, cottage cheese, cream cheese, other cheeses  Avoid: All cheeses made with those spices to "avoid"  Potatoes and pasta  OK: Potato, rice, macaroni, noodles, spaghetti  Avoid: None  Soups  OK: All soups without heavy seasoning  Avoid: Soups made with those spices to "avoid"  Vegetables  OK: Canned, cooked, fresh or frozen mildly flavored vegetables without seeds, skins or coarse fiber  Avoid: Vegetables prepared with those spices to "avoid"; skin and seeds of vegetables and those with coarse fiber, broccoli, cabbage, cauliflower, cucumber, green peppers, and corn  Spices  OK: Salt, lemon and limejuice, vinegar, all extracts, sage, cinnamon, thyme, mace, allspice, paprika  Avoid: Chili powder, cloves, pepper, seed spices, garlic, gravy pickles, highly seasoned salad dressings  [employer] last reviewed this educational content on 06/18/2016    2000-2019 [employer], [place]. [address], [place], [place] [address]. All rights reserved. This information is not intended as a substitute for professional medical care. Always follow your healthcare professional's instructions.          2. Flu-like symptoms    - POCT Influenza A/B : negative  - POCT Rapid Group A Strep : negative

## 2018-04-15 DIAGNOSIS — J851 Abscess of lung with pneumonia: Secondary | ICD-10-CM | POA: Diagnosis present

## 2018-04-15 DIAGNOSIS — E871 Hypo-osmolality and hyponatremia: Secondary | ICD-10-CM | POA: Diagnosis present

## 2018-04-15 DIAGNOSIS — E878 Other disorders of electrolyte and fluid balance, not elsewhere classified: Secondary | ICD-10-CM | POA: Diagnosis present

## 2018-04-15 DIAGNOSIS — E860 Dehydration: Secondary | ICD-10-CM | POA: Diagnosis present

## 2018-04-15 DIAGNOSIS — J939 Pneumothorax, unspecified: Principal | ICD-10-CM | POA: Diagnosis present

## 2018-04-15 DIAGNOSIS — Z79899 Other long term (current) drug therapy: Secondary | ICD-10-CM

## 2018-04-15 DIAGNOSIS — K219 Gastro-esophageal reflux disease without esophagitis: Secondary | ICD-10-CM | POA: Diagnosis present

## 2018-04-15 DIAGNOSIS — G40409 Other generalized epilepsy and epileptic syndromes, not intractable, without status epilepticus: Secondary | ICD-10-CM | POA: Diagnosis present

## 2018-04-15 DIAGNOSIS — J948 Other specified pleural conditions: Secondary | ICD-10-CM | POA: Diagnosis not present

## 2018-04-15 DIAGNOSIS — F909 Attention-deficit hyperactivity disorder, unspecified type: Secondary | ICD-10-CM | POA: Diagnosis present

## 2018-04-15 DIAGNOSIS — L0591 Pilonidal cyst without abscess: Secondary | ICD-10-CM | POA: Diagnosis present

## 2018-04-15 DIAGNOSIS — J9382 Other air leak: Secondary | ICD-10-CM | POA: Diagnosis not present

## 2018-04-15 DIAGNOSIS — Z87891 Personal history of nicotine dependence: Secondary | ICD-10-CM

## 2018-04-15 DIAGNOSIS — E872 Acidosis: Secondary | ICD-10-CM | POA: Diagnosis present

## 2018-04-16 ENCOUNTER — Emergency Department: Payer: BLUE CROSS/BLUE SHIELD

## 2018-04-16 ENCOUNTER — Inpatient Hospital Stay
Admission: EM | Admit: 2018-04-16 | Discharge: 2018-05-05 | DRG: 163 | Disposition: A | Discharge status: Home Health | Payer: BLUE CROSS/BLUE SHIELD | Attending: Specialist | Admitting: Specialist

## 2018-04-16 ENCOUNTER — Inpatient Hospital Stay: Payer: BLUE CROSS/BLUE SHIELD

## 2018-04-16 DIAGNOSIS — J852 Abscess of lung without pneumonia: Secondary | ICD-10-CM

## 2018-04-16 DIAGNOSIS — K219 Gastro-esophageal reflux disease without esophagitis: Secondary | ICD-10-CM

## 2018-04-16 DIAGNOSIS — R569 Unspecified convulsions: Secondary | ICD-10-CM

## 2018-04-16 DIAGNOSIS — Z72820 Sleep deprivation: Secondary | ICD-10-CM

## 2018-04-16 DIAGNOSIS — Z79899 Other long term (current) drug therapy: Secondary | ICD-10-CM

## 2018-04-16 DIAGNOSIS — Z0189 Encounter for other specified special examinations: Secondary | ICD-10-CM

## 2018-04-16 DIAGNOSIS — J869 Pyothorax without fistula: Secondary | ICD-10-CM

## 2018-04-16 DIAGNOSIS — J939 Pneumothorax, unspecified: Secondary | ICD-10-CM

## 2018-04-16 LAB — CBC AND DIFFERENTIAL
Absolute NRBC: 0 10*3/uL (ref 0.00–0.00)
Absolute NRBC: 0 10*3/uL (ref 0.00–0.00)
Basophils Absolute Automated: 0.09 10*3/uL — ABNORMAL HIGH (ref 0.00–0.08)
Basophils Absolute Automated: 0.17 10*3/uL — ABNORMAL HIGH (ref 0.00–0.08)
Basophils Automated: 0.6 %
Basophils Automated: 0.6 %
Eosinophils Absolute Automated: 0.24 10*3/uL (ref 0.00–0.44)
Eosinophils Absolute Automated: 2.53 10*3/uL — ABNORMAL HIGH (ref 0.00–0.44)
Eosinophils Automated: 1.6 %
Eosinophils Automated: 8.8 %
Hematocrit: 38.2 % (ref 37.6–49.6)
Hematocrit: 45.2 % (ref 37.6–49.6)
Hgb: 13.3 g/dL (ref 12.5–17.1)
Hgb: 16 g/dL (ref 12.5–17.1)
Immature Granulocytes Absolute: 0.12 10*3/uL — ABNORMAL HIGH (ref 0.00–0.07)
Immature Granulocytes Absolute: 0.27 10*3/uL — ABNORMAL HIGH (ref 0.00–0.07)
Immature Granulocytes: 0.8 %
Immature Granulocytes: 0.9 %
Lymphocytes Absolute Automated: 1.25 10*3/uL (ref 0.42–3.22)
Lymphocytes Absolute Automated: 2.86 10*3/uL (ref 0.42–3.22)
Lymphocytes Automated: 8.2 %
Lymphocytes Automated: 9.9 %
MCH: 27.5 pg (ref 25.1–33.5)
MCH: 27.5 pg (ref 25.1–33.5)
MCHC: 34.8 g/dL (ref 31.5–35.8)
MCHC: 35.4 g/dL (ref 31.5–35.8)
MCV: 77.7 fL — ABNORMAL LOW (ref 78.0–96.0)
MCV: 79.1 fL (ref 78.0–96.0)
MPV: 10.3 fL (ref 8.9–12.5)
MPV: 9.9 fL (ref 8.9–12.5)
Monocytes Absolute Automated: 1.44 10*3/uL — ABNORMAL HIGH (ref 0.21–0.85)
Monocytes Absolute Automated: 2.65 10*3/uL — ABNORMAL HIGH (ref 0.21–0.85)
Monocytes: 9.2 %
Monocytes: 9.4 %
Neutrophils Absolute: 12.17 10*3/uL — ABNORMAL HIGH (ref 1.10–6.33)
Neutrophils Absolute: 20.29 10*3/uL — ABNORMAL HIGH (ref 1.10–6.33)
Neutrophils: 70.6 %
Neutrophils: 79.4 %
Nucleated RBC: 0 /100 WBC (ref 0.0–0.0)
Nucleated RBC: 0 /100 WBC (ref 0.0–0.0)
Platelets: 432 10*3/uL — ABNORMAL HIGH (ref 142–346)
Platelets: 608 10*3/uL — ABNORMAL HIGH (ref 142–346)
RBC: 4.83 10*6/uL (ref 4.20–5.90)
RBC: 5.82 10*6/uL (ref 4.20–5.90)
RDW: 12 % (ref 11–15)
RDW: 12 % (ref 11–15)
WBC: 15.31 10*3/uL — ABNORMAL HIGH (ref 3.10–9.50)
WBC: 28.77 10*3/uL — ABNORMAL HIGH (ref 3.10–9.50)

## 2018-04-16 LAB — URINALYSIS REFLEX TO MICROSCOPIC EXAM - REFLEX TO CULTURE
Bilirubin, UA: NEGATIVE
Glucose, UA: NEGATIVE
Ketones UA: 20 — AB
Leukocyte Esterase, UA: NEGATIVE
Nitrite, UA: NEGATIVE
Protein, UR: 30 — AB
Specific Gravity UA: 1.016 (ref 1.001–1.035)
Urine pH: 6 (ref 5.0–8.0)
Urobilinogen, UA: NORMAL mg/dL (ref 0.2–2.0)

## 2018-04-16 LAB — GFR
EGFR: >60
EGFR: >60

## 2018-04-16 LAB — BASIC METABOLIC PANEL
BUN: 13 mg/dL (ref 9.0–28.0)
CO2: 21 mEq/L — ABNORMAL LOW (ref 22–29)
Calcium: 8.5 mg/dL (ref 8.5–10.5)
Chloride: 103 mEq/L (ref 100–111)
Creatinine: 1 mg/dL (ref 0.7–1.3)
Glucose: 104 mg/dL — ABNORMAL HIGH (ref 70–100)
Potassium: 3.9 mEq/L (ref 3.5–5.1)
Sodium: 137 mEq/L (ref 136–145)

## 2018-04-16 LAB — COMPREHENSIVE METABOLIC PANEL
ALT: 11 U/L (ref 0–55)
AST (SGOT): 14 U/L (ref 5–34)
Albumin/Globulin Ratio: 1 (ref 0.9–2.2)
Albumin: 4.2 g/dL (ref 3.5–5.0)
Alkaline Phosphatase: 93 U/L (ref 38–106)
BUN: 15 mg/dL (ref 9.0–28.0)
Bilirubin, Total: 0.9 mg/dL (ref 0.2–1.2)
CO2: 17 mEq/L — ABNORMAL LOW (ref 22–29)
Calcium: 10.2 mg/dL (ref 8.5–10.5)
Chloride: 97 mEq/L — ABNORMAL LOW (ref 100–111)
Creatinine: 1.3 mg/dL (ref 0.7–1.3)
Globulin: 4.1 g/dL — ABNORMAL HIGH (ref 2.0–3.6)
Glucose: 174 mg/dL — ABNORMAL HIGH (ref 70–100)
Potassium: 4.3 mEq/L (ref 3.5–5.1)
Protein, Total: 8.3 g/dL (ref 6.0–8.3)
Sodium: 134 mEq/L — ABNORMAL LOW (ref 136–145)

## 2018-04-16 LAB — RAPID DRUG SCREEN, URINE
Barbiturate Screen, UR: NEGATIVE
Benzodiazepine Screen, UR: POSITIVE — AB
Cannabinoid Screen, UR: POSITIVE — AB
Cocaine, UR: NEGATIVE
Opiate Screen, UR: NEGATIVE
PCP Screen, UR: NEGATIVE
Urine Amphetamine Screen: NEGATIVE

## 2018-04-16 LAB — MAGNESIUM: Magnesium: 2.5 mg/dL (ref 1.6–2.6)

## 2018-04-16 LAB — LIPASE: Lipase: 52 U/L (ref 8–78)

## 2018-04-16 MED ORDER — NALOXONE HCL 0.4 MG/ML IJ SOLN (WRAP)
0.20 mg | INTRAMUSCULAR | Status: DC | PRN
Start: 2018-04-16 — End: 2018-05-05

## 2018-04-16 MED ORDER — ONDANSETRON 4 MG PO TBDP
4.00 mg | ORAL_TABLET | Freq: Four times a day (QID) | ORAL | Status: DC | PRN
Start: 2018-04-16 — End: 2018-05-05

## 2018-04-16 MED ORDER — MELATONIN 3 MG PO TABS
3.0000 mg | ORAL_TABLET | Freq: Every evening | ORAL | Status: DC
Start: 2018-04-16 — End: 2018-05-05
  Administered 2018-04-16 – 2018-05-04 (×19): 3 mg via ORAL
  Filled 2018-04-16 (×19): qty 1

## 2018-04-16 MED ORDER — SODIUM CHLORIDE 0.9 % IV BOLUS
1000.0000 mL | Freq: Once | INTRAVENOUS | Status: AC
Start: 2018-04-16 — End: 2018-04-16
  Administered 2018-04-16: 1000 mL via INTRAVENOUS

## 2018-04-16 MED ORDER — BENZONATATE 100 MG PO CAPS
100.00 mg | ORAL_CAPSULE | Freq: Three times a day (TID) | ORAL | Status: DC | PRN
Start: 2018-04-16 — End: 2018-05-05

## 2018-04-16 MED ORDER — ACETAMINOPHEN 325 MG PO TABS
650.0000 mg | ORAL_TABLET | Freq: Four times a day (QID) | ORAL | Status: DC | PRN
Start: 2018-04-16 — End: 2018-05-05
  Administered 2018-04-16 – 2018-05-05 (×36): 650 mg via ORAL
  Filled 2018-04-16 (×36): qty 2

## 2018-04-16 MED ORDER — PLASMA-LYTE A IV INFUSION
INTRAVENOUS | Status: AC
Start: 2018-04-16 — End: 2018-04-16

## 2018-04-16 MED ORDER — ENOXAPARIN SODIUM 40 MG/0.4ML SC SOLN
40.00 mg | Freq: Every day | SUBCUTANEOUS | Status: DC
Start: 2018-04-16 — End: 2018-04-20
  Administered 2018-04-16: 09:00:00 40 mg via SUBCUTANEOUS
  Filled 2018-04-16 (×4): qty 0.4

## 2018-04-16 MED ORDER — AZITHROMYCIN 500 MG IN 250 ML NS IVPB VIAL-MATE (CNR)
500.00 mg | Freq: Once | INTRAVENOUS | Status: AC
Start: 2018-04-16 — End: 2018-04-16
  Administered 2018-04-16: 04:00:00 500 mg via INTRAVENOUS
  Filled 2018-04-16: qty 250

## 2018-04-16 MED ORDER — SODIUM CHLORIDE 0.9 % IV MBP
1.0000 g | Freq: Once | INTRAVENOUS | Status: AC
Start: 2018-04-16 — End: 2018-04-16
  Administered 2018-04-16: 03:00:00 1 g via INTRAVENOUS
  Filled 2018-04-16: qty 1000

## 2018-04-16 MED ORDER — LORAZEPAM 0.5 MG PO TABS
0.5000 mg | ORAL_TABLET | ORAL | Status: DC | PRN
Start: 2018-04-16 — End: 2018-04-16

## 2018-04-16 MED ORDER — MIDAZOLAM HCL 1 MG/ML IJ SOLN (WRAP)
2.0000 mg | Freq: Once | INTRAMUSCULAR | Status: AC
Start: 2018-04-16 — End: 2018-04-16

## 2018-04-16 MED ORDER — ONDANSETRON HCL 4 MG/2ML IJ SOLN
4.00 mg | Freq: Four times a day (QID) | INTRAMUSCULAR | Status: DC | PRN
Start: 2018-04-16 — End: 2018-05-05
  Administered 2018-04-22: 21:00:00 4 mg via INTRAVENOUS

## 2018-04-16 MED ORDER — ONDANSETRON HCL 4 MG/2ML IJ SOLN
4.0000 mg | Freq: Once | INTRAMUSCULAR | Status: AC
Start: 2018-04-16 — End: 2018-04-16
  Administered 2018-04-16: 01:00:00 4 mg via INTRAVENOUS
  Filled 2018-04-16: qty 2

## 2018-04-16 MED ORDER — ALBUTEROL SULFATE (2.5 MG/3ML) 0.083% IN NEBU
2.50 mg | INHALATION_SOLUTION | Freq: Four times a day (QID) | RESPIRATORY_TRACT | Status: DC
Start: 2018-04-16 — End: 2018-04-18
  Administered 2018-04-16 – 2018-04-18 (×6): 2.5 mg via RESPIRATORY_TRACT
  Filled 2018-04-16 (×8): qty 3

## 2018-04-16 MED ORDER — LEVETIRACETAM IN NACL 1000 MG/100ML IV SOLN
1000.00 mg | Freq: Once | INTRAVENOUS | Status: AC
Start: 2018-04-16 — End: 2018-04-16
  Administered 2018-04-16: 02:00:00 1000 mg via INTRAVENOUS
  Filled 2018-04-16: qty 100

## 2018-04-16 MED ORDER — LIDOCAINE 5 % EX PTCH
1.00 | MEDICATED_PATCH | CUTANEOUS | Status: DC
Start: 2018-04-16 — End: 2018-05-05
  Administered 2018-04-16 – 2018-05-04 (×18): 1 via TRANSDERMAL
  Filled 2018-04-16 (×19): qty 1

## 2018-04-16 MED ORDER — LEVETIRACETAM IN NACL 500 MG/100ML IV SOLN
500.00 mg | Freq: Two times a day (BID) | INTRAVENOUS | Status: DC
Start: 2018-04-16 — End: 2018-05-05
  Administered 2018-04-16 – 2018-04-23 (×15): 500 mg via INTRAVENOUS
  Filled 2018-04-16 (×43): qty 100

## 2018-04-16 MED ORDER — BUDESONIDE 0.25 MG/2ML IN SUSP
0.25 mg | Freq: Two times a day (BID) | RESPIRATORY_TRACT | Status: DC
Start: 2018-04-16 — End: 2018-05-05
  Administered 2018-04-16 – 2018-05-04 (×31): 0.25 mg via RESPIRATORY_TRACT
  Filled 2018-04-16 (×39): qty 2

## 2018-04-16 MED ORDER — ALBUTEROL-IPRATROPIUM 2.5-0.5 (3) MG/3ML IN SOLN
3.00 mL | Freq: Four times a day (QID) | RESPIRATORY_TRACT | Status: DC
Start: 2018-04-16 — End: 2018-04-16

## 2018-04-16 MED ORDER — FAMOTIDINE 10 MG/ML IV SOLN (WRAP)
20.0000 mg | Freq: Once | INTRAVENOUS | Status: AC
Start: 2018-04-16 — End: 2018-04-16
  Administered 2018-04-16: 01:00:00 20 mg via INTRAVENOUS
  Filled 2018-04-16: qty 2

## 2018-04-16 MED ORDER — MIDAZOLAM HCL 1 MG/ML IJ SOLN (WRAP)
INTRAMUSCULAR | Status: AC
Start: 2018-04-16 — End: 2018-04-16
  Administered 2018-04-16: 02:00:00 2 mg via INTRAVENOUS
  Filled 2018-04-16: qty 2

## 2018-04-16 MED ORDER — PANTOPRAZOLE SODIUM 40 MG PO TBEC
40.00 mg | DELAYED_RELEASE_TABLET | Freq: Every morning | ORAL | Status: DC
Start: 2018-04-16 — End: 2018-05-05
  Administered 2018-04-16 – 2018-05-05 (×19): 40 mg via ORAL
  Filled 2018-04-16 (×19): qty 1

## 2018-04-16 MED ORDER — LORAZEPAM 0.5 MG PO TABS
0.50 mg | ORAL_TABLET | Freq: Once | ORAL | Status: AC | PRN
Start: 2018-04-16 — End: 2018-04-17
  Administered 2018-04-17: 12:00:00 0.5 mg via ORAL
  Filled 2018-04-16: qty 1

## 2018-04-16 NOTE — Plan of Care (Signed)
Received report from Darcel RN from ED  Pt transferred to [facility] to room 423   Alert and oriented x 4  No complaints of SOB  States mild discomfort at chest tube site on left side  Chest tube to suction -20  Pt on seizure precautions, padded side rails  Pt not on o2  Tele at [facility]  Skin check done with Amparo CT   Pt currently getting EEG at bedside   Continue hourly rounding    Skin assessment Upon Admission/Transfer:    Dual skin check with  __Amparo CT/_________________                 Date and time _2/27/20__1830_____    Patient on NC and HFNC        ___ Yes         _X__   NO          Comments _________    Assessed  behind the Ears        _X__  Yes      ___  NO         Comments __________       Assessed  bilateral Elbow        _X_  Yes        ____ NO         Comments __________    Assessed Bilateral Heel            __X__  Yes     ____ NO         Comments __________    Assessed under devices eg SCD's, trach           ____ Yes              ___ NO    Date wound identified:  ___________    ______________________________    _______________________________    Vernie Shanks notified Y/N     Date _____________         Key: Yes: If able to assess skin, Comments section used if wound found or any breakdown noted.            No: If patient refused or nurse unable to complete assessment

## 2018-04-16 NOTE — Consults (Signed)
[facility] Consultation Note                                       Date Time: 04/16/18 1:21 PM  Patient Name: Craig Phelps  Requesting Physician: Marene Lenz, MD  Date of Admission: 04/16/2018    CC / Reason for Consultation: Seizure         Assessment:   23 y/o male with history of ADHD and epilepsy (no AEDs) who presented on 04/16/18 with:    1. Seizure - likely lowered seizure threshold in setting of recent GI illness and sleep deprivation      Plan:   - Recommend MRI brain w/wo contrast, seizure protocol - ordered  - Recommend rEEG - ordered  - Continue Keppra 500 mg BID  - Seizure precautions  - Medical management per primary team  - Further recommendations per attending neurologist     Attending note:    The patient was seen and examined by me. History was independently reviewed and confirmed by me. All pertinent parts of the neurological exam we performed and confirmed by me, with any additional findings on exam noted in bold. I agree with the assessment and plan as outlined by the mid-level provider as above, with any additional considerations or recommendations as noted:     The patient presents with a seizure, after dealing with an outpatient illness, causing him to not be eating much and says had not gotten much sleep for 6 nights prior to event. Now feels at baseline but being treated for pneumothorax. Hx of prior seizures in college, in setting of coming off of EtOH binges, and sleep deprivation. Prior w/u as noted below, and was recommended to start Lamictal but appears patient did not, instead making lifestyle changes.  Exam - normal neuro, non-focal.  Seizure - in setting of sleep deprivation but by history patient may be predisposed to seizures based on past EEG report. Agree with plan as above including updated MRI brain and EEG. No driving for now given seizure.  Seizure precautions, supportive care.  Medical management RE: pneumothorax.    Dandra Velardi B. Stacy Gardner,  MD  [facility]    HPI   Mr. Craig Phelps is a 23 y.o. male with significant PMHx of ADHD and seizure who presents to the hospital on 04/16/2018 with Seizure. Per records, pt had a seizure at home the evening of 2/26. It was described as tonic clonic, lasting about 3 minutes. Pt was watching a movie with his parents. Pt was loaded with Keppra in the ED and neurology was consulted. Pt has also been diagnosed with a pneumothorax. Pt c/o persistent cough x 3 months and 5 days of GI symptoms. Pt states that he was starting to feel "weird" on the morning of the seizure and thought that he would likely have a seizure that day. He states that from what he was told he looked to the left and started to shake. He denies tongue biting or incontinence. He currently denies headaches, N/V, visual changes, dizziness, weakness or paresthesias.    Pt's first seizure was in 2015. Pt notes that it was thought to be due to stress of college exams, poor sleep and poor eating habits. Pt was seen by Dr. Delma Post [facility]) whose note details that EtOH and sleep deprivation may have played a role in the patient's first seizure.  Pt had a vEEG at the time which showed frontal-central spike and polyspike and slow wave complexes in sleep consistent with generalized epilepsy. Pt was recommended to start lamotrigine, but pt declined. Pt had another seizure on 11/16/2014. Pt was last seen by Dr. Laveda Norman on 02/28/15 where he recommended to start lamotrigine.       Past Medical Hx     Past Medical History:   Diagnosis Date    ADHD (attention deficit hyperactivity disorder)     took Adderall in highschool and college but sopped    ENT disease     Pollen allergies/sneezing    Fractures     rt wrist x 2    GERD (gastroesophageal reflux disease)     occ/prilosec OTC    H1N1 influenza 2009    Pilonidal cyst 2014    Seizure           Past Surgical Hx:     Past Surgical History:   Procedure Laterality Date     CYSTECTOMY, PILONIDAL  08/24/2012    Procedure: CYSTECTOMY, PILONIDAL;  Surgeon: Despina Hidden, MD;  Location: [place] [facility];  Service: General;  Laterality: N/A;    WISDOM TOOTH EXTRACTION  2014        Family Medical History:      Family History   Problem Relation Age of Onset    ADD / ADHD Father     ADD / ADHD Brother     Cancer Paternal Grandfather 41        bladder, smoker    Pulmonary fibrosis Maternal Grandmother         also with ITP    Alzheimer's disease Paternal Grandmother 40       Social Hx     Social History     Socioeconomic History    Marital status: Single     Spouse name: Not on file    Number of children: 0    Years of education: Not on file    Highest education level: Not on file   Occupational History    Occupation: Archivist   Social Needs    Financial resource strain: Not on file    Food insecurity:     Worry: Not on file     Inability: Not on file    Transportation needs:     Medical: Not on file     Non-medical: Not on file   Tobacco Use    Smoking status: Former Smoker    Smokeless tobacco: Never Used    Tobacco comment: quit 1 week ago   Substance and Sexual Activity    Alcohol use: Not Currently     Alcohol/week: 0.0 standard drinks    Drug use: No    Sexual activity: Yes     Partners: Female     Comment: monogamous   Lifestyle    Physical activity:     Days per week: Not on file     Minutes per session: Not on file    Stress: Not on file   Relationships    Social connections:     Talks on phone: Not on file     Gets together: Not on file     Attends religious service: Not on file     Active member of club or organization: Not on file     Attends meetings of clubs or organizations: Not on file     Relationship status: Not on file    Intimate  partner violence:     Fear of current or ex partner: Not on file     Emotionally abused: Not on file     Physically abused: Not on file     Forced sexual activity: Not on file   Other Topics Concern    Not on file    Social History Narrative    Taking a break from school    Wanting to transfer for the fall    Working at [employer]        Exercise: no     Diet: nothing special, tries to avoid GERD inducing foods       Meds     Home :   Prior to Admission medications    Medication Sig Start Date End Date Taking? Authorizing Provider   albuterol (PROVENTIL HFA;VENTOLIN HFA) 108 (90 Base) MCG/ACT inhaler as needed 04/02/18  Yes [provider]   benzonatate (TESSALON) 100 MG capsule as needed 04/02/18  Yes [provider]   fluticasone (FLONASE) 50 MCG/ACT nasal spray 1 spray by Nasal route daily   Yes [provider]   ondansetron (ZOFRAN-ODT) 4 MG disintegrating tablet Take 1 tablet (4 mg total) by mouth every 8 (eight) hours as needed for Nausea 04/14/18 05/14/18  Tibebu, Atitegeb, MD      Inpatient :   Current Facility-Administered Medications   Medication Dose Route Frequency    enoxaparin  40 mg Subcutaneous Daily    levETIRAcetam  500 mg Intravenous Q12H SCH    lidocaine  1 patch Transdermal Q24H    melatonin  3 mg Oral QHS    pantoprazole  40 mg Oral QAM AC         Allergies    Other      Review of Systems   All other systems were reviewed and are negative except for that mentioned in the HPI    Physical Exam:   Temp:  [97.6 F (36.4 C)-98.5 F (36.9 C)] 98.5 F (36.9 C)  Heart Rate:  [82-146] 85  Resp Rate:  [14-24] 16  BP: (116-131)/(67-98) 125/67     Vital Signs:  Reviewed    General: The patient was well developed and well nourished.  No acute distress. Cooperative with the exam  CVS: RRR  Resp: no retractions; L chest tube in place  Abd: Soft  Extremities: no pedal edema, extremities normal in color    Mental Status: The patient is awake, alert and oriented to person, place, and time.  Affect is normal  Fund of knowledge appropriate  Recent and remote memory are intact   Attention span and concentration appear normal.  Language function is normal. There is no evidence of aphasia in  conversational speech.    Cranial nerves:   -CN III, IV, VI: Pupils equal, round, and reactive to light; extraocular movements intact; no ptosis   -CN V: Facial sensation intact in V1 through V3 distributions   -CN VII: Face symmetric   -CN VIII: Hearing intact to conversational speech   -CN IX, X: Normal phonation   -CN XI: Symmetric full strength of trapezius muscles   -CN XII: Tongue protrudes midline    Motor: Muscle tone normal without spasticity or flaccidity. No atrophy.  No pronator drift.  Strength          R / L  R / L  Deltoid             5 / 5                 Hip Flexion      5 / 5  Triceps            5 / 5                 Hip extension  5 / 5  Biceps             5 / 5                 Dorsiflexion     5 / 5  FF                   5 / 5                 Plantar flexion 5 / 5    Sensory:   Light touch intact.    Reflexes:  R / L     R / L  Biceps  2 / 2  Knees  2 / 2  BR                   2 / 2  Plantars Flexor / Flexor    Coordination: FTN intact. No tremors    Gait: Deferred      Labs:   Labs reviewed. Managed by primary team.     Results     Procedure Component Value Units Date/Time    Rapid drug screen, urine [[id number]]  (Abnormal) Collected:  04/16/18 0501    Specimen:  Urine Updated:  04/16/18 0550     Amphetamine Screen, UR Negative     Barbiturate Screen, UR Negative     Benzodiazepine Screen, UR Positive     Cannabinoid Screen, UR Positive     Cocaine, UR Negative     Opiate Screen, UR Negative     PCP Screen, UR Negative    Narrative:       Replace urinary catheter prior to obtaining the urine culture  if it has been in place for greater than or equal to 14  days:->N/A No Foley  Indications for U/A Reflex to Micro - Reflex to  Culture:->Suprapubic Pain/Tenderness or Dysuria    UA Reflex to Micro - Reflex to Culture [[id number]]  (Abnormal) Collected:  04/16/18 0501     Updated:  04/16/18 0543     Urine Type Urine, Clean Ca     Color, UA Yellow     Clarity, UA  Clear     Specific Gravity UA 1.016     Urine pH 6.0     Leukocyte Esterase, UA Negative     Nitrite, UA Negative     Protein, UR 30     Glucose, UA Negative     Ketones UA 20     Urobilinogen, UA Normal mg/dL      Bilirubin, UA Negative     Blood, UA Small     RBC, UA 0 - 2 /hpf      WBC, UA 0 - 5 /hpf     Narrative:       Replace urinary catheter prior to obtaining the urine culture  if it has been in place for greater than or equal to 14  days:->N/A No Foley  Indications for U/A Reflex to Micro - Reflex to  Culture:->Suprapubic  Pain/Tenderness or Dysuria    Basic Metabolic Panel [[id number]]  (Abnormal) Collected:  04/16/18 0341    Specimen:  Blood Updated:  04/16/18 0420     Glucose 104 mg/dL      BUN 24.4 mg/dL      Creatinine 1.0 mg/dL      Calcium 8.5 mg/dL      Sodium 010 mEq/L      Potassium 3.9 mEq/L      Chloride 103 mEq/L      CO2 21 mEq/L     Magnesium [[id number]] Collected:  04/16/18 0341    Specimen:  Blood Updated:  04/16/18 0420     Magnesium 2.5 mg/dL     GFR [[id number]] Collected:  04/16/18 0341     Updated:  04/16/18 0420     EGFR >60.0    CBC and differential [[id number]]  (Abnormal) Collected:  04/16/18 0341    Specimen:  Blood Updated:  04/16/18 0409     WBC 15.31 x10 3/uL      Hgb 13.3 g/dL      Hematocrit 29.5 %      Platelets 432 x10 3/uL      RBC 4.83 x10 6/uL      MCV 79.1 fL      MCH 27.5 pg      MCHC 34.8 g/dL      RDW 12 %      MPV 9.9 fL      Neutrophils 79.4 %      Lymphocytes Automated 8.2 %      Monocytes 9.4 %      Eosinophils Automated 1.6 %      Basophils Automated 0.6 %      Immature Granulocyte 0.8 %      Nucleated RBC 0.0 /100 WBC      Neutrophils Absolute 12.17 x10 3/uL      Abs Lymph Automated 1.25 x10 3/uL      Abs Mono Automated 1.44 x10 3/uL      Abs Eos Automated 0.24 x10 3/uL      Absolute Baso Automated 0.09 x10 3/uL      Absolute Immature Granulocyte 0.12 x10 3/uL      Absolute NRBC 0.00 x10 3/uL     Lipase [[id number]] Collected:  04/16/18 0007    Specimen:  Blood  Updated:  04/16/18 0315     Lipase 52 U/L     GFR [[id number]] Collected:  04/16/18 0007     Updated:  04/16/18 0059     EGFR >60.0    Comprehensive metabolic panel [[id number]]  (Abnormal) Collected:  04/16/18 0007    Specimen:  Blood Updated:  04/16/18 0059     Glucose 174 mg/dL      BUN 25.4 mg/dL      Creatinine 1.3 mg/dL      Sodium 270 mEq/L      Potassium 4.3 mEq/L      Chloride 97 mEq/L      CO2 17 mEq/L      Calcium 10.2 mg/dL      Protein, Total 8.3 g/dL      Albumin 4.2 g/dL      AST (SGOT) 14 U/L      ALT 11 U/L      Alkaline Phosphatase 93 U/L      Bilirubin, Total 0.9 mg/dL      Globulin 4.1 g/dL      Albumin/Globulin Ratio 1.0    CBC and differential [[id number]]  (Abnormal) Collected:  04/16/18  0007    Specimen:  Blood Updated:  04/16/18 0043     WBC 28.77 x10 3/uL      Hgb 16.0 g/dL      Hematocrit 40.3 %      Platelets 608 x10 3/uL      RBC 5.82 x10 6/uL      MCV 77.7 fL      MCH 27.5 pg      MCHC 35.4 g/dL      RDW 12 %      MPV 10.3 fL      Neutrophils 70.6 %      Lymphocytes Automated 9.9 %      Monocytes 9.2 %      Eosinophils Automated 8.8 %      Basophils Automated 0.6 %      Immature Granulocyte 0.9 %      Nucleated RBC 0.0 /100 WBC      Neutrophils Absolute 20.29 x10 3/uL      Abs Lymph Automated 2.86 x10 3/uL      Abs Mono Automated 2.65 x10 3/uL      Abs Eos Automated 2.53 x10 3/uL      Absolute Baso Automated 0.17 x10 3/uL      Absolute Immature Granulocyte 0.27 x10 3/uL      Absolute NRBC 0.00 x10 3/uL           Rads:     Results for orders placed or performed during the hospital encounter of 04/16/18   CT Head without Contrast    Narrative    TECHNIQUE: CT of the head without intravenous contrast.     The following dose reduction techniques were utilized: Automated  exposure control and/or adjustment of the mA and/or kV according to  patient size, and the use of iterative reconstruction technique.    INDICATION: Head trauma, vomiting, seizure.     COMPARISON: 12/01/2016.    FINDINGS:      No acute intracranial hemorrhage, mass effect, loss of gray-white matter  differentiation, or evidence of acute ventricular outflow obstruction.     No depressed skull fracture or aggressive appearing osseous lesions.     The visualized paranasal sinuses and temporal bone structures are  well-aerated.       Impression    No acute intracranial abnormality.     Eloise Harman, MD   04/16/2018 12:56 AM   Results for orders placed or performed in visit on 12/07/13   MRI Brain W WO Contrast    Narrative                         [facility]                                 [facility]    BRITTIN, JANIK PERFORMED AT:  [id number]       DOB:October 08, 1995                 [address]  12/07/2013     AGE:66   Gender:M              Physicians Only: [phone]             MARCO  CASTRO MD                                   [H]       [address]       [place], [place] [address]        MRI BRAIN WITHOUT AND WITH CONTRAST    HISTORY: Seizure.    COMPARISON: Head CT 01/24/12.    TECHNIQUE: Multiplanar imaging of the brain was performed before and after  the intravenous administration of 9cc Gadavist.        FINDINGS: The ventricles are normal in appearance. There is no restricted  diffusion to suggest acute infarct. Brain parenchyma signal appears normal  including that of the hippocampi/parahippocampal gyri. No intracranial  hemorrhage is visualized. The major intracranial flow-voids are  unremarkable. No abnormal enhancement is seen. There is bilateral maxillary  sinus retention cyst formation.      Impression     No acute intracranial abnormality is seen. Normal-appearing  brain.    Electronically signed by: Leandro Reasoner M.D.  [employer]    PM: 12/08/13       Signed by:  Barnie Mort, PA-C  Physician Assistant  [facility]  Daytime: [employer] [phone]  Consult requests: [place] [id number]  After 5:00 pm:  [phone]    Please see attending Neurologist note that accompanies this mid-level encounter note.

## 2018-04-16 NOTE — Consults (Addendum)
Full consult pending    IMP:    1.  Spontaneous PTX with tension- in the setting of acute seizure.  May have increased intrathoracic pressure resulting in this.  Is thin male - may have blebs  2.  Seizure disorder    REC:    1.  Chest tube to water seal  2.  CT chest  3.  CXR in AM if resolved then clamp x 4 hours and repeat CXR    [facility]  CONSULTATION    [phone] [facility]1) / [phone] (Spectralink #2) / [phone] (Office - On call)      Date Time: 04/16/2018 11:39 AM - seen and examined at this time   Patient Name: Craig Phelps  Requesting Physician: Marene Lenz, MD      Chief Complaint / Reason for Consultation:   Spontaneous pneumothorax    Assessment:   1.  Spontaneous PTX with tension- in the setting of acute seizure.  May have increased intrathoracic pressure resulting in this.  Is thin male - may have blebs  2.  Seizure disorder      Plan:   1.  Chest tube to water seal  2.  CT chest  3.  CXR in AM if resolved then clamp x 4 hours and repeat CXR  He is mildly bronchospastic and Albuterol and pulmicort have been added     The case was discussed with the patient and Dr. Hollice Espy    The patient presents complex issues, as noted above, and requires continued hospitalization and monitoring given the risk of deterioration.  Risk is high given his seizures, pneumothorax and bronchospasm.      Thank you for the consult.  We will follow along with you.    History:   Craig Phelps is a 23 y.o. male with a history of intermittent seizures and asthma.  He was brought to the ED today following a tonic clonic seizure at home.  He was in respiratory distress and a stat CXR revealed a large left pneumothorax.  This has improved significantly following chest tube placement.  There was tension on the CXR.  The patient has a history of mild intermittent asthma.  He smoked in the past but quit a year ago.  He has never had a pneumothorax before.  He did have a fall a week  ago and hit his head.  He did not have ant chest wall trauma.       The history was obtained from the patient and his mother.    Past Medical History:     Past Medical History:   Diagnosis Date    ADHD (attention deficit hyperactivity disorder)     took Adderall in highschool and college but sopped    ENT disease     Pollen allergies/sneezing    Fractures     rt wrist x 2    GERD (gastroesophageal reflux disease)     occ/prilosec OTC    H1N1 influenza 2009    Pilonidal cyst 2014    Seizure        Past Surgical History:     Past Surgical History:   Procedure Laterality Date    CYSTECTOMY, PILONIDAL  08/24/2012    Procedure: CYSTECTOMY, PILONIDAL;  Surgeon: Despina Hidden, MD;  Location: [facility] MAIN OR;  Service: General;  Laterality: N/A;    WISDOM TOOTH EXTRACTION  2014       Family History:  Family History   Problem Relation Age of Onset    ADD / ADHD Father     ADD / ADHD Brother     Cancer Paternal Grandfather 72        bladder, smoker    Pulmonary fibrosis Maternal Grandmother         also with ITP    Alzheimer's disease Paternal Grandmother 7       Family history reviewed.  No pertinent family history relevant to acute illness.    Social History:     Social History     Socioeconomic History    Marital status: Single     Spouse name: Not on file    Number of children: 0    Years of education: Not on file    Highest education level: Not on file   Occupational History    Occupation: Archivist   Social Needs    Financial resource strain: Not on file    Food insecurity:     Worry: Not on file     Inability: Not on file    Transportation needs:     Medical: Not on file     Non-medical: Not on file   Tobacco Use    Smoking status: Former Smoker    Smokeless tobacco: Never Used    Tobacco comment: quit 1 week ago   Substance and Sexual Activity    Alcohol use: Not Currently     Alcohol/week: 0.0 standard drinks    Drug use: No    Sexual activity: Yes     Partners: Female      Comment: monogamous   Lifestyle    Physical activity:     Days per week: Not on file     Minutes per session: Not on file    Stress: Not on file   Relationships    Social connections:     Talks on phone: Not on file     Gets together: Not on file     Attends religious service: Not on file     Active member of club or organization: Not on file     Attends meetings of clubs or organizations: Not on file     Relationship status: Not on file    Intimate partner violence:     Fear of current or ex partner: Not on file     Emotionally abused: Not on file     Physically abused: Not on file     Forced sexual activity: Not on file   Other Topics Concern    Not on file   Social History Narrative    Taking a break from school    Wanting to transfer for the fall    Working at [employer]        Exercise: no     Diet: nothing special, tries to avoid GERD inducing foods       Allergies:     Allergies   Allergen Reactions    Other      Environmental allergies       Medications:     Current Facility-Administered Medications   Medication Dose Route Frequency    albuterol  2.5 mg Nebulization Q6H SCH    budesonide  0.25 mg Nebulization BID    enoxaparin  40 mg Subcutaneous Daily    levETIRAcetam  500 mg Intravenous Q12H SCH    lidocaine  1 patch Transdermal Q24H    melatonin  3 mg Oral QHS  pantoprazole  40 mg Oral QAM AC       I personally reviewed the medication list.    Review of Systems:   A comprehensive 12 point review of systems was obtained.  The ROS is negative except for that reported in the HPI and as noted: History obtained from the patient  General ROS: negative  Psychological ROS: negative  Ophthalmic ROS: negative  ENT ROS: negative  Allergy and Immunology ROS: negative  Hematological and Lymphatic ROS: negative  Endocrine ROS: negative  Breast ROS: negative  Respiratory ROS: positive for - shortness of breath and wheezing  negative for - cough, hemoptysis, orthopnea or sputum changes  Cardiovascular  ROS: positive for - shortness of breath  negative for - chest pain, edema, irregular heartbeat, loss of consciousness or orthopnea  Gastrointestinal ROS: no abdominal pain, change in bowel habits, or black or bloody stools  Genito-Urinary ROS: no dysuria, trouble voiding, or hematuria  Musculoskeletal ROS: negative  Neurological ROS: positive for - headaches and seizures  negative for - bowel and bladder control changes, confusion, dizziness or gait disturbance  Dermatological ROS: negative    Physical Exam:   Temp:  [97.5 F (36.4 C)-98.4 F (36.9 C)] 97.5 F (36.4 C)  Heart Rate:  [73-80] 80  Resp Rate:  [15-17] 17  BP: (113-147)/(75-99) 113/75    Intake and Output Summary (Last 24 hours) at Date Time    Intake/Output Summary (Last 24 hours) at 04/17/2018 0538  Last data filed at 04/16/2018 1900  Gross per 24 hour   Intake --   Output 960 ml   Net -960 ml       General appearance - alert, well appearing, and in no distress and oriented to person, place, and time  Mental status - alert, oriented to person, place, and time, normal mood, behavior, speech, dress, motor activity, and thought processes  Eyes - pupils equal and reactive, extraocular eye movements intact, sclera anicteric  Mouth - mucous membranes moist, pharynx normal without lesions  Neck - supple, no significant adenopathy  Lymphatics - no palpable lymphadenopathy, no hepatosplenomegaly  Chest - wheezing noted bilaterally.  Left chest tube - no air leak.  Lungs are otherwise without rales or rhonchi  Heart - normal rate, regular rhythm, normal S1, S2, no murmurs, rubs, clicks or gallops  Abdomen - soft, nontender, nondistended, no masses or organomegaly  Neurological - alert, oriented, normal speech, no focal findings or movement disorder noted  Musculoskeletal - no joint tenderness, deformity or swelling  Extremities - peripheral pulses normal, no pedal edema, no clubbing or cyanosis  Skin - normal coloration and turgor, no rashes, no suspicious skin  lesions noted    Labs Reviewed:     04/16/2018 4:09 AM - Interface, Lab In [facility]     Component Value Ref Range & Units Status   WBC 15.31High   3.10 - 9.50 x10 3/uL Final   Hgb 13.3  12.5 - 17.1 g/dL Final   Hematocrit 54.0  37.6 - 49.6 % Final   Platelets 432High   142 - 346 x10 3/uL Final   RBC 4.83  4.20 - 5.90 x10 6/uL Final   MCV 79.1  78.0 - 96.0 fL Final   MCH 27.5  25.1 - 33.5 pg Final   MCHC 34.8  31.5 - 35.8 g/dL Final   RDW 12  11 - 15 % Final   MPV 9.9  8.9 - 12.5 fL Final   Neutrophils 79.4  None % Final  Lymphocytes Automated 8.2  None % Final   Monocytes 9.4  None % Final   Eosinophils Automated 1.6  None % Final   Basophils Automated 0.6  None % Final   Immature Granulocyte 0.8  None % Final   Nucleated RBC 0.0  0.0 - 0.0 /100 WBC Final   Neutrophils Absolute 12.17High   1.10 - 6.33 x10 3/uL Final   Abs Lymph Automated 1.25  0.42 - 3.22 x10 3/uL Final   Abs Mono Automated 1.44High   0.21 - 0.85 x10 3/uL Final   Abs Eos Automated 0.24  0.00 - 0.44 x10 3/uL Final   Absolute Baso Automated 0.09High   0.00 - 0.08 x10 3/uL Final   Absolute Immature Granulocyte 0.12High   0.00 - 0.07 x10 3/uL Final   Absolute NRBC 0.00  0.00 - 0.00 x10 3/uL Final     04/16/2018 4:20 AM - Interface, Lab In [facility]     Component Value Ref Range & Units Status   Glucose 104High   70 - 100 mg/dL Final   ADA guidelines for diabetes mellitus:   Fasting: Equal to or greater than 126 mg/dL   Random:  Equal to or greater than 200 mg/dL    BUN 16.1  9.0 - 09.6 mg/dL Final   Creatinine 1.0  0.7 - 1.3 mg/dL Final   Calcium 8.5  8.5 - 10.5 mg/dL Final   Sodium 045  409 - 145 mEq/L Final   Potassium 3.9  3.5 - 5.1 mEq/L Final   Chloride 103  100 - 111 mEq/L Final   CO2 21Low   22 - 29 mEq/L Final         I personally reviewed the labs.    Rads:   Radiological images were directly reviewed.   XR Chest 2 Views [IMG36] (Order [id number])   Status: Final result   Study Result     TECHNIQUE:  PA and lateral views of the  chest.    INDICATION: cough, sputum production     COMPARISON: 07/03/2018.    FINDINGS:     Large left pneumothorax with partial collapse of the left lung and mild  rightward deviation of the mediastinum. The right lung is relatively  clear. No right pneumothorax or pleural effusions.    IMPRESSION:        Large left pneumothorax with possible tension component.           XR Chest AP Portable [IMG1259] (Order [id number])   Status: Final result   Study Result     TECHNIQUE:  AP chest    INDICATION: post chest tube placement     COMPARISON: 12/15/2018 12:50 AM    FINDINGS:    Interval placement of a left basilar pigtail chest tube with significant  interval decrease in size of the left pneumothorax, which currently  measures up to 2.5 cm apically. There is residual left midlung and left  basilar atelectasis with possible trace effusion.    Resolved rightward mediastinal shift.    IMPRESSION:        Interval placement of a left basilar pigtail chest tube with significant  interval decrease in size of the left pneumothorax, currently measuring  up to 2.5 cm apically. Left midlung and left basilar atelectasis with  possible trace pleural effusion.    Eloise Harman, MD          Radiology Results (24 Hour)     Procedure Component Value Units Date/Time    CT Chest WO  Contrast [[id number]] Collected:  04/16/18 1314    Order Status:  Completed Updated:  04/16/18 1328    Narrative:       CLINICAL HISTORY: History of seizure, pneumothorax    COMPARISON: None    TECHNIQUE: Unenhanced axial images of the chest were performed.    FINDINGS:    A small to moderate left-sided hydropneumothorax is present with a  left-sided chest tube in place. Along the left major fissure, a  loculated gas and fluid-containing structure likely represents residual  loculated hydropneumothorax as opposed to a cavitary intrapulmonary  lesion. Otherwise, no evidence of bullous change within the left lung.  There is residual atelectasis at  the left lung base. The right lung is  clear.    The heart and pulmonary vasculature normal in caliber. No  lymphadenopathy. Residual thymus within the anterior mediastinum.    Appearance of the upper abdomen is unremarkable. No fractures are  detected.      Impression:         1. Small to moderate left-sided hydropneumothorax with cavitary focus  along the left major fissure which is felt to represent loculated  hydropneumothorax. An intrapulmonary cavitary lesion is felt to be less  likely. Continued follow-up recommended.    Launa Flight, MD   04/16/2018 1:24 PM          Signed by: Asencion Noble

## 2018-04-16 NOTE — ED Notes (Signed)
Bed: S 8  Expected date:   Expected time:   Means of arrival:   Comments:  Term Clean

## 2018-04-16 NOTE — H&P (Signed)
ADMISSION HISTORY AND PHYSICAL EXAM    Date Time: 04/16/18 4:18 AM  Patient Name: Craig Phelps  Attending Physician: Princella Pellegrini*  Primary Care Physician: Threasa Alpha, MD    CC: Pneumothorax    Assessment:   Principal Problem:    Pneumothorax  Active Problems:    Seizure    GERD (gastroesophageal reflux disease)    Craig Phelps is a 23 y.o. male with PMH of seizure d/o (not on any AED) admitted with Seizure at home and subsequently found to have left pneumothorax after about 3 months of persistent cough and 5 days of likely viral gastroenteritis, s/p Left CT in the ED. Stable on RA  Plan (includes assessment of each problem, Ddx, severity; rationale for dx/tx decision making):   #Seizure   - with history of seizure history, not on AED. First Seizure while in college in 2016 though to be due to stress of exams, poor sleep and poor eating habits. A similar episode related to increased anxiety happened in 2017 where he was recommended to start Keppra but he declined.   - With another seizure tonight likely in the setting of poor sleep and acute GI illness over there last 5 days, resolved spontaneously.   - negative head CT, s/p versed in the ED, Keppra loaded with 1 gram and will continue Keppra 500mg  IV BID for now. He had a previous bad experience with Neurologist in the area but cannot recall the name.   - Will need Neurology f/u   - I believe his leukocytosis and thrombocytosis on presentation may all be reactionary after seizure at home tonight. Repeat CBC and monitor off antibiotics for now     # Left pneumothorax   - unclear exactly how long this has been there. He has had chronic cough for the last 3 months ago, quit smoking about 1 week ago  - likely from chronic cough and recent retching  - continue CT to suction -20cm H20, pulm consult (Dr. Mayford Knife) placed in epic for management.   Daily CXR for tomorrow AM ordered, Lidocaine patch for site, Tylenol PRN     # Hyponatremia  Hypochloremic Gapped Metabolic acidosis   - likely related to recent GI illness, expect viral gastroenteritis; Give Plasmalyte 100cc/hour x 10 hours, received 1L bolus IVF in the ED   - repeat BMP , F/U UA     # Hx of GERD   - Takes Dexilant at home PRN, begin protonix while he is here, Regular diet, can add arafate PRN for symptom control.     #Global  F -   Current Facility-Administered Medications   Medication    Plasma-Lyte A     E - replete PRN  N - Diet regular    DVT ppx - Lovenox  GI ppx - Protonix  PT/OT - Not indicated     CODE - Full Code    History of Presenting Illness:   Craig Phelps is a 23 y.o. male with PMH of seizure d/o (not on any AED) who presents to the hospital with acute seizure last evening. His last seizure was in 2017 and he was diagnosed in 2016 usually induced by anxiety, stress. He has had a chronic cough for the last 3 months, he has a history of e-cigarette use but quit about 6 months ago, still smoking tobacco up until 1 week ago when cough had not resolved. [facility] and received cough suppressant, flonase and albuterol which seemed  to help slightly. Then about 5 days ago he developed nausea/vomiting and diarrhea, no hematemesis or bloody stool, no recent travel. With subjective fevers and chills. He has been unable to keep anything down and these symptoms have resulted in poor sleep over the last 5 days. Endorses feeling of "fogginess". He presented to urgent care earlier today for 5 days of Left lower chest sharp pain, non radiating, pleuritic in nature. He denies any palpitations. Thought he had sprained a muscle form coughing.  Tonight while watching a movie with his parents, he suffered about a 3 minute tonic-clonic seizure and was post-ictal for about 90 minutes after.     Review of Systems:   ROS, per HPI  All other systems reviewed and otherwise negative.  Past Medical History:     Past Medical History:   Diagnosis Date    ADHD (attention deficit  hyperactivity disorder)     took Adderall in highschool and college but sopped    ENT disease     Pollen allergies/sneezing    Fractures     rt wrist x 2    GERD (gastroesophageal reflux disease)     occ/prilosec OTC    H1N1 influenza 2009    Pilonidal cyst 2014    Seizure        Available old records reviewed, including:  EHR    Past Surgical History:     Past Surgical History:   Procedure Laterality Date    CYSTECTOMY, PILONIDAL  08/24/2012    Procedure: CYSTECTOMY, PILONIDAL;  Surgeon: Despina Hidden, MD;  Location: [facility];  Service: General;  Laterality: N/A;    WISDOM TOOTH EXTRACTION  2014       Family History: Include pertinent negatives     Family History   Problem Relation Age of Onset    ADD / ADHD Father     ADD / ADHD Brother     Cancer Paternal Grandfather 35        bladder, smoker    Pulmonary fibrosis Maternal Grandmother         also with ITP    Alzheimer's disease Paternal Grandmother 69       Social History:     Social History     Tobacco Use   Smoking Status Former Smoker   Smokeless Tobacco Never Used   Tobacco Comment    quit 1 week ago     Social History     Substance and Sexual Activity   Alcohol Use Not Currently    Alcohol/week: 0.0 standard drinks     Social History     Substance and Sexual Activity   Drug Use No       Allergies:     Allergies   Allergen Reactions    Other      Environmental allergies       Medications:     Home Medications     Med List Status:  Complete Set By: Craig Shawl, DO at 04/16/2018  3:27 AM                albuterol (PROVENTIL HFA;VENTOLIN HFA) 108 (90 Base) MCG/ACT inhaler     as needed     benzonatate (TESSALON) 100 MG capsule     as needed     fluticasone (FLONASE) 50 MCG/ACT nasal spray     1 spray by Nasal route daily     ondansetron (ZOFRAN-ODT) 4 MG disintegrating tablet     Take 1  tablet (4 mg total) by mouth every 8 (eight) hours as needed for Nausea        Method by which medications were confirmed on admission: with Pt  and family    Physical Exam:     Patient Vitals for the past 24 hrs:   BP Temp Temp src Pulse Resp SpO2 Height Weight   04/16/18 0330 125/67 98.5 F (36.9 C) Oral 85 (!) 24 98 % -- --   04/16/18 0223 116/77 -- -- 82 14 99 % -- --   04/16/18 0000 (!) 131/98 97.6 F (36.4 C) Temporal (!) 140 -- -- -- --   04/15/18 2354 -- -- -- (!) 146 18 92 % 1.829 m (6') 70.3 kg (155 lb)     Body mass index is 21.02 kg/m.    Intake/Output Summary (Last 24 hours) at 04/16/2018 0418  Last data filed at 04/16/2018 0108  Gross per 24 hour   Intake 1000 ml   Output --   Net 1000 ml       Physical Exam    Gen: Well appearing in NAD  Neuro: AAO x 3, non -focal, CN II-XII grossly intact, answers questions appropriately  HEENT:NCAT, Sclera are anicteric bilaterally.   Cardiac: Regular heart sounds, S1 and S2 without any M/R/G.   Pulm: Bronchial breath sounds on the anterior chest. Vesicular breath sounds throughout the posterior chest, but diminished in the bilateral bases. Patient splinting with respirations given pain at chest tube site  Abdomen: Thin, soft, NT, ND. Normoactive bowel sounds.   Extremities: Warm and Dry x 4, no peripheral pitting edema. Peripheral pulses 2/4 BUE and BLE  Skin Intact without any rashes, open wounds, or abrasions.     Labs & Imaging:     Results     Procedure Component Value Units Date/Time    CBC and differential [[id number]]  (Abnormal) Collected:  04/16/18 0341    Specimen:  Blood Updated:  04/16/18 0409     WBC 15.31 x10 3/uL      Hgb 13.3 g/dL      Hematocrit 40.9 %      Platelets 432 x10 3/uL      RBC 4.83 x10 6/uL      MCV 79.1 fL      MCH 27.5 pg      MCHC 34.8 g/dL      RDW 12 %      MPV 9.9 fL      Neutrophils 79.4 %      Lymphocytes Automated 8.2 %      Monocytes 9.4 %      Eosinophils Automated 1.6 %      Basophils Automated 0.6 %      Immature Granulocyte 0.8 %      Nucleated RBC 0.0 /100 WBC      Neutrophils Absolute 12.17 x10 3/uL      Abs Lymph Automated 1.25 x10 3/uL      Abs Mono Automated  1.44 x10 3/uL      Abs Eos Automated 0.24 x10 3/uL      Absolute Baso Automated 0.09 x10 3/uL      Absolute Immature Granulocyte 0.12 x10 3/uL      Absolute NRBC 0.00 x10 3/uL     Basic Metabolic Panel [[id number]] Collected:  04/16/18 0341    Specimen:  Blood Updated:  04/16/18 0353    Magnesium [[id number]] Collected:  04/16/18 0341    Specimen:  Blood Updated:  04/16/18 0353  Lipase [[id number]] Collected:  04/16/18 0007    Specimen:  Blood Updated:  04/16/18 0315     Lipase 52 U/L     GFR [[id number]] Collected:  04/16/18 0007     Updated:  04/16/18 0059     EGFR >60.0    Comprehensive metabolic panel [[id number]]  (Abnormal) Collected:  04/16/18 0007    Specimen:  Blood Updated:  04/16/18 0059     Glucose 174 mg/dL      BUN 08.6 mg/dL      Creatinine 1.3 mg/dL      Sodium 578 mEq/L      Potassium 4.3 mEq/L      Chloride 97 mEq/L      CO2 17 mEq/L      Calcium 10.2 mg/dL      Protein, Total 8.3 g/dL      Albumin 4.2 g/dL      AST (SGOT) 14 U/L      ALT 11 U/L      Alkaline Phosphatase 93 U/L      Bilirubin, Total 0.9 mg/dL      Globulin 4.1 g/dL      Albumin/Globulin Ratio 1.0    CBC and differential [[id number]]  (Abnormal) Collected:  04/16/18 0007    Specimen:  Blood Updated:  04/16/18 0043     WBC 28.77 x10 3/uL      Hgb 16.0 g/dL      Hematocrit 41.3 %      Platelets 608 x10 3/uL      RBC 5.82 x10 6/uL      MCV 77.7 fL      MCH 27.5 pg      MCHC 35.4 g/dL      RDW 12 %      MPV 10.3 fL      Neutrophils 70.6 %      Lymphocytes Automated 9.9 %      Monocytes 9.2 %      Eosinophils Automated 8.8 %      Basophils Automated 0.6 %      Immature Granulocyte 0.9 %      Nucleated RBC 0.0 /100 WBC      Neutrophils Absolute 20.29 x10 3/uL      Abs Lymph Automated 2.86 x10 3/uL      Abs Mono Automated 2.65 x10 3/uL      Abs Eos Automated 2.53 x10 3/uL      Absolute Baso Automated 0.17 x10 3/uL      Absolute Immature Granulocyte 0.27 x10 3/uL      Absolute NRBC 0.00 x10 3/uL           EKG interpreted by me: no  new    Imaging personally reviewed, including:  Xr Chest 2 Views    Result Date: 04/16/2018   Large left pneumothorax with possible tension component. These critical findings were discussed with Dr. Elna Breslow [facility] at 04/16/2018 1:34 AM. Eloise Harman, MD 04/16/2018 1:37 AM    Ct Head Without Contrast    Result Date: 04/16/2018  No acute intracranial abnormality. Eloise Harman, MD 04/16/2018 12:56 AM    Xr Chest  Ap Portable    Result Date: 04/16/2018   Interval placement of a left basilar pigtail chest tube with significant interval decrease in size of the left pneumothorax, currently measuring up to 2.5 cm apically. Left midlung and left basilar atelectasis with possible trace pleural effusion. Eloise Harman, MD 04/16/2018 2:58 AM      Signed by:  Craig Shawl, DO  ZO:[id number], [place], MD      IM Attending Addendum:  I have seen and examined patient personally. I have discussed with housestaff, and reviewed and agree with findings and plan as per Dr. Merlinda Frederick with following caveats:    23 y.o. male PMH seizures who presents with seizure.  Patient says that for the past week patient has been complaining of a viral illness.  He says that he has had a cough for around 3 months or so that has been worsening recently.  He says that he was smoking tobacco but quit because of his cough worsening.  [facility] and was given symptomatic treatment.  Around last Friday patient says that he started to have some nausea vomiting and diarrhea.  He said he had  difficulty eating or drinking given the symptoms.  He also notes that he has not been sleeping well and attributes that to these symptoms as well.  He says he only sleeps 2 to 3 hours a day for the past 5 days or so.  He went to an urgent care because he started to develop some left upper abdominal pain.  It was thought that he had sprained a muscle from coughing given his chronic cough.  He said he was concerned that he  may have a seizure as he was developing some fogginess.  He watched a movie with his parents and had a seizure and was postictal.  Given these findings he was brought to the emergency room.  In the past he has had 2 prior episodes of seizure (2016 and 2017).  He says that they are both brought on by either fatigue, sleep loss, alcohol consumption.  He was seen by neurologist in that area as well as one in [place].  He was told to take Keppra however patient says that he did not want to.    After his arrival in the emergency room a chest x-ray was performed given his constant coughing and chest pain.  This showed a pneumothorax and a chest tube was placed.  He was also started on Keppra.    #PTX - tension component s/p Chest tube in ED.  This may have been present for several days or weeks given his coughing and worsened recently given his new LUQ abdominal pain.  #History of seizures - 2016 and 2017. Does not have a neurologist in the area.  #Leukocytosis - likely from viral gastroenteritis and seizure   #GERD    - inpt/tele  - Pulm consult order placed in Epic for chest tube management  - Will need neurology follow up upon discharge  - Continue keppra  - CXR after CT removed.  - IVF    FOLEY: none  DVT prophylaxis: SCDs, heparin    Disposition:   Today's date: 04/16/2018   Admit Date: 04/16/2018 12:05 AM  Anticipated medical stability for discharge:Yellow - maybe tomorrow  Service status: inpatient  Reason for ongoing hospitalization: seizure, ptx  Anticipated discharge needs: tbd    Dorcas Carrow, MD

## 2018-04-16 NOTE — ED Notes (Signed)
NURSING NOTE FOR THE RECEIVING INPATIENT NURSE     ED NURSE Scarlette Ar [id number]   ED CHARGE NURSE [id number]   ADMISSION INFORMATION   Craig Phelps is a 23 y.o. male admitted with a diagnosis of:    1. Pneumothorax, unspecified type    2. Seizure       Isolation:  None  Place of residence / Living situation:  Home Independent   NURSING CARE   Mental Status alert and oriented   ADL ADLs:            Independent with all ADLs  Ambulation:  No difficulty   Pertinent Information  and Safety Concerns Pt has spontaneous pneumothorax, pigtail just placed.Family at bedside.       VITAL SIGNS     Time of Last Set of Vitals 0223   Temperature 97.68F   BP 116/77   Pulse 82   Respirations 12   Pulse OX 99%on RA        IV LINES     Peripheral IV 04/16/18 Right Antecubital (Active)   Placement Date/Time: 04/16/18 0008   Size (Gauge): 18 G  Orientation: Right  Location: Antecubital  Site Prep: Chlorhexidine   Inserted by: Sam EMT  Insertion attempts: 1  Patient Tolerance: Tolerated well  Specimens drawn with IV placement: Labs  Ult...        LAB RESULTS     Labs Reviewed   CBC AND DIFFERENTIAL - Abnormal; Notable for the following components:       Result Value    WBC 28.77 (*)     Platelets 608 (*)     MCV 77.7 (*)     Neutrophils Absolute 20.29 (*)     Abs Mono Automated 2.65 (*)     Abs Eos Automated 2.53 (*)     Absolute Baso Automated 0.17 (*)     Absolute Immature Granulocyte 0.27 (*)     All other components within normal limits   COMPREHENSIVE METABOLIC PANEL - Abnormal; Notable for the following components:    Glucose 174 (*)     Sodium 134 (*)     Chloride 97 (*)     CO2 17 (*)     Globulin 4.1 (*)     All other components within normal limits   GFR   URINALYSIS REFLEX TO MICROSCOPIC EXAM - REFLEX TO CULTURE   RAPID DRUG SCREEN, URINE   LIPASE        PATIENT BELONGINGS     None

## 2018-04-16 NOTE — Plan of Care (Signed)
AOx4, standby assist, RA. Pain to L chest where L chest tube is. L chest tube to suction. Pending port CXR every AM, pending pulmo consult. Lidoderm patch to L flank. Medicated with PRN tylenol. Plasmalyte at 100 cc/hr running. Tolerating regular diet.      Problem: Pain  Goal: Pain at adequate level as identified by patient  Outcome: Progressing  Flowsheets (Taken 04/16/2018 0656)  Pain at adequate level as identified by patient: Identify patient comfort function goal; Assess for risk of opioid induced respiratory depression, including snoring/sleep apnea. Alert healthcare team of risk factors identified.; Assess pain on admission, during daily assessment and/or before any "as needed" intervention(s); Reassess pain within 30-60 minutes of any procedure/intervention, per Pain Assessment, Intervention, Reassessment (AIR) Cycle; Evaluate if patient comfort function goal is met; Evaluate patient's satisfaction with pain management progress; Offer non-pharmacological pain management interventions     Problem: Inadequate Gas Exchange  Goal: Adequate oxygenation and improved ventilation  Outcome: Progressing  Flowsheets (Taken 04/16/2018 0656)  Adequate oxygenation and improved ventilation: Assess lung sounds; Monitor SpO2 and treat as needed; Plan activities to conserve energy: plan rest periods; Increase activity as tolerated/progressive mobility

## 2018-04-16 NOTE — Progress Notes (Signed)
Routine EEG completed as ordered. Results to follow.

## 2018-04-16 NOTE — Progress Notes (Incomplete)
Pt remains A/o x 4, VSS, lungs diminished at the bases

## 2018-04-16 NOTE — Progress Notes (Signed)
MEDICINE PROGRESS NOTE    Date Time: 04/16/18 6:00 PM  Patient Name: Craig Phelps  Attending Physician: Marene Lenz, MD    Assessment:   23yo male with h/o seizure disorder, not on AED, who presented after having seizure at home. Pt has been ill for the past 3 weeks with bronchitis and possible GI illness, which pt thinks lowered his seizure threshold. In the ER, pt noted to have L pneumothorax. Chest tube was placed by the ER. Pt now reports feeling improved.     # L sided pneumothorax  # seizure - with h/o seizure d/o, no on AEDs  # hyponatremia - related to volume depletion, improving  # vomiting - improving  # leukocytosis - improving  # dehydration    Plan:   - pulmonary consulted, appreciate recs  - chest tube to water seal  - daily CXR  - chest CT shows small-moderate L sided hydropneumothorax with cavitary focus along L main fissure, likely loculated hydropneumothorax  - albuterol nebs, budesonide nebs BID (discussed with pulm)  - neurology consulted for seizure disorder, appreciate recs  - check EEG  - MRI brain ordered  - cont keppra 500mg  BID  - pt had IVF on admission, now able to eat and drink. Will hold further IVF for now  DVT ppx: lovenox    Case discussed with: pt, pt's family member at bedside, pulm, neuro    Safety Checklist:     DVT prophylaxis:  CHEST guideline (See page [id number]) Chemical   Foley:  [place] Rn Foley protocol Not present   IVs:  Peripheral IV   PT/OT: Not needed   Daily CBC & or Chem ordered:  SHM/ABIM guidelines (see #5) Yes, due to clinical and lab instability   Reference for approximate charges of common labs: CBC auto diff - $76   BMP - $99   Mg - $79    Lines:     Patient Lines/Drains/Airways Status    Active PICC Line / CVC Line / PIV Line / Drain / Airway / Intraosseous Line / Epidural Line / ART Line / Line / Wound / Pressure Ulcer / NG/OG Tube     Name:   Placement date:   Placement time:   Site:   Days:    Peripheral IV 04/16/18 Right Antecubital   04/16/18     0008    Antecubital   less than 1                 Disposition: (Please see PAF column for Expected D/C Date)   Today's date: 04/16/2018   Admit Date: 04/16/2018 12:05 AM   LOS: 0  Clinical Milestones: seizure, chest tube  Anticipated discharge needs: f/u with neurology      Subjective     CC: Pneumothorax    Interval History/24 hour events: admitted with pneumothorax    HPI/Subjective: pt reports he is feeling better after having chest tube placed. Reports having some wheezing. Nausea is improved and he is eating some soup.     Review of Systems:     As per HPI    Physical Exam:     VITAL SIGNS PHYSICAL EXAM   Temp:  [97.6 F (36.4 C)-98.5 F (36.9 C)] 98.5 F (36.9 C)  Heart Rate:  [82-146] 85  Resp Rate:  [14-24] 16  BP: (116-131)/(67-98) 125/67        Intake/Output Summary (Last 24 hours) at 04/16/2018 1800  Last data filed at 04/16/2018 0108  Gross  per 24 hour   Intake 1000 ml   Output --   Net 1000 ml    Physical Exam  General: awake, alert X 3  Cardiovascular: regular rate and rhythm, no murmurs, rubs or gallops  Lungs: clear to auscultation bilaterally, anterior wheezing, no rhonchi, or rales  Abdomen: soft, non-tender, non-distended; no palpable masses,  normoactive bowel sounds  Extremities: no edema          Meds:     Medications were reviewed:  Current Facility-Administered Medications   Medication Dose Route Frequency    albuterol  2.5 mg Nebulization Q6H SCH    budesonide  0.25 mg Nebulization BID    enoxaparin  40 mg Subcutaneous Daily    levETIRAcetam  500 mg Intravenous Q12H SCH    lidocaine  1 patch Transdermal Q24H    melatonin  3 mg Oral QHS    pantoprazole  40 mg Oral QAM AC     Current Facility-Administered Medications   Medication Dose Route Frequency Last Rate     Current Facility-Administered Medications   Medication Dose Route    acetaminophen  650 mg Oral    benzonatate  100 mg Oral    LORazepam  0.5 mg Oral    naloxone  0.2 mg Intravenous    ondansetron  4 mg Oral    Or     ondansetron  4 mg Intravenous         Labs:     Labs (last 72 hours):    Recent Labs   Lab 04/16/18  0341 04/16/18  0007   WBC 15.31* 28.77*   Hgb 13.3 16.0   Hematocrit 38.2 45.2   Platelets 432* 608*          Recent Labs   Lab 04/16/18  0341 04/16/18  0007   Sodium 137 134*   Potassium 3.9 4.3   Chloride 103 97*   CO2 21* 17*   BUN 13.0 15.0   Creatinine 1.0 1.3   Calcium 8.5 10.2   Albumin  --  4.2   Protein, Total  --  8.3   Bilirubin, Total  --  0.9   Alkaline Phosphatase  --  93   ALT  --  11   AST (SGOT)  --  14   Glucose 104* 174*                   Microbiology, reviewed and are significant for:  Microbiology Results     None          Imaging, reviewed and are significant for:  CT chest:  IMPRESSION:     1. Small to moderate left-sided hydropneumothorax with cavitary focus  along the left major fissure which is felt to represent loculated  hydropneumothorax. An intrapulmonary cavitary lesion is felt to be less  likely. Continued follow-up recommended.      Signed by: Marene Lenz, MD

## 2018-04-16 NOTE — ED Notes (Signed)
Bed: N 34  Expected date:   Expected time:   Means of arrival:   Comments:

## 2018-04-16 NOTE — ED Provider Notes (Signed)
[facility] EMERGENCY DEPARTMENT H&P            CLINICAL INFORMATION        HPI:      Chief Complaint: Seizures  .    Craig Phelps is a 23 y.o. male who presents with moderate episode of seizure for today. Beginning about 1 week ago, pt was walking down the stari early in the morning to get some water, when he had a mechanical trip and fall down the stairs, hitting the RT temporal region of his head. Over the past 3-4 days, pt has had decreased PO, cough, vomiting, nausea, and has been unable to sleep well, which mother notes is one of his triggers.   Today around noon, pt told his mother that he was feeling "foggy" and "like he was going to have a seizure". This persisted through the afternoon, and when the whole family was at home watching a movie, pt had a full tonic clonic seizure that lasted about 2-3 minutes.  Resolved without intervention.   Pt at this time denies any alcohol or drug use and states that he "just quit smoking cigarettes.     History obtained from: Patient              ROS:        Review of Systems   Constitutional: Positive for appetite change. Negative for fatigue and fever.   HENT: Negative for congestion and sore throat.    Eyes: Negative for photophobia and redness.   Respiratory: Positive for cough. Negative for shortness of breath and wheezing.    Cardiovascular: Negative for chest pain and leg swelling.   Gastrointestinal: Positive for nausea and vomiting. Negative for abdominal pain.   Genitourinary: Negative for dysuria and urgency.   Musculoskeletal: Negative for back pain and joint swelling.   Skin: Negative for color change and rash.   Neurological: Positive for seizures. Negative for weakness and headaches.   Psychiatric/Behavioral: Positive for sleep disturbance.                 Physical Exam:      BP: (!) 131/98, Temp: 97.6 F (36.4 C), Temp Source: Temporal, Heart Rate: (!) 146, Resp Rate: 18, SpO2: 92 %, Height: 6' (182.9 cm), Weight: 70.3 kg    Physical Exam    Constitutional: He is oriented to person, place, and time. He appears well-developed and well-nourished.   HENT:   Head: Normocephalic and atraumatic.   Eyes: Pupils are equal, round, and reactive to light. EOM are normal.   Neck: Normal range of motion. Neck supple. No tracheal deviation present.   Cardiovascular: Normal rate, regular rhythm and normal heart sounds.   No murmur heard.  Pulmonary/Chest: Effort normal. Tachypnea noted. No respiratory distress. He has decreased breath sounds in the right upper field, the right middle field and the right lower field. He has no wheezes.   Abdominal: Soft. Bowel sounds are normal. He exhibits no distension. There is no abdominal tenderness.   Musculoskeletal: Normal range of motion.      Comments: TTP RT trapezius    Neurological: He is alert and oriented to person, place, and time. He has normal strength. No cranial nerve deficit or sensory deficit. GCS eye subscore is 4. GCS verbal subscore is 5. GCS motor subscore is 6.   Skin: Skin is warm and dry. No rash noted.  PAST HISTORY        Primary Care Provider: Threasa Alpha, MD        PMH/PSH:    .     Past Medical History:   Diagnosis Date    ADHD (attention deficit hyperactivity disorder)     took Adderall in highschool and college but sopped    ENT disease     Pollen allergies/sneezing    Fractures     rt wrist x 2    GERD (gastroesophageal reflux disease)     occ/prilosec OTC    H1N1 influenza 2009    Pilonidal cyst 2014    Seizure        He has a past surgical history that includes Wisdom tooth extraction (2014) and CYSTECTOMY, PILONIDAL (08/24/2012).      Social/Family History:      He reports that he has quit smoking. He has never used smokeless tobacco. He reports previous alcohol use. He reports that he does not use drugs.    Family History   Problem Relation Age of Onset    ADD / ADHD Father     ADD / ADHD Brother     Cancer Paternal Grandfather 84        bladder, smoker     Pulmonary fibrosis Maternal Grandmother         also with ITP    Alzheimer's disease Paternal Grandmother 17         Listed Medications on Arrival:    .     Previous Medications    ALBUTEROL (PROVENTIL HFA;VENTOLIN HFA) 108 (90 BASE) MCG/ACT INHALER    as needed    BENZONATATE (TESSALON) 100 MG CAPSULE    as needed    FLUTICASONE (FLONASE) 50 MCG/ACT NASAL SPRAY    1 spray by Nasal route daily    ONDANSETRON (ZOFRAN-ODT) 4 MG DISINTEGRATING TABLET    Take 1 tablet (4 mg total) by mouth every 8 (eight) hours as needed for Nausea      Allergies: He is allergic to other.            VISIT INFORMATION        Medications Given in the ED:    .     ED Medication Orders (From admission, onward)    Start Ordered     Status Ordering Provider    04/16/18 1200 04/16/18 0332  levETIRAcetam (KEPPRA) IVPB 500 mg 100 mL  Every 12 hours scheduled     Route: Intravenous  Ordered Dose: 500 mg     Acknowledged MCLAUGHLIN, JESSICA N    04/16/18 0900 04/16/18 0333  enoxaparin (LOVENOX) syringe 40 mg  Daily     Route: Subcutaneous  Ordered Dose: 40 mg     Acknowledged MCLAUGHLIN, JESSICA N    04/16/18 0745 04/16/18 0333  pantoprazole (PROTONIX) EC tablet 40 mg  Every morning before breakfast     Route: Oral  Ordered Dose: 40 mg     Acknowledged MCLAUGHLIN, JESSICA N    04/16/18 0334 04/16/18 0333  Plasma-Lyte A IV infusion  Continuous     Route: Intravenous     Acknowledged MCLAUGHLIN, JESSICA N    04/16/18 0334 04/16/18 0333  melatonin tablet 3 mg  At bedtime     Route: Oral  Ordered Dose: 3 mg     Acknowledged MCLAUGHLIN, JESSICA N    04/16/18 0334 04/16/18 0333  lidocaine (LIDODERM) 5 % 1 patch  Every 24 hours     Route: Transdermal  Ordered Dose: 1 patch     Acknowledged MCLAUGHLIN, JESSICA N    04/16/18 0333 04/16/18 0333  naloxone (NARCAN) injection 0.2 mg  As needed     Route: Intravenous  Ordered Dose: 0.2 mg     Acknowledged MCLAUGHLIN, JESSICA N    04/16/18 0333 04/16/18 0333  ondansetron (ZOFRAN-ODT) disintegrating tablet 4 mg   Every 6 hours PRN     Route: Oral  Ordered Dose: 4 mg     Acknowledged MCLAUGHLIN, JESSICA N    04/16/18 0333 04/16/18 0333  ondansetron (ZOFRAN) injection 4 mg  Every 6 hours PRN     Route: Intravenous  Ordered Dose: 4 mg     Acknowledged MCLAUGHLIN, JESSICA N    04/16/18 0333 04/16/18 0333  acetaminophen (TYLENOL) tablet 650 mg  Every 6 hours PRN     Route: Oral  Ordered Dose: 650 mg     Acknowledged MCLAUGHLIN, JESSICA N    04/16/18 0212 04/16/18 0211  cefTRIAXone (ROCEPHIN) 1 g in sodium chloride 0.9 % 100 mL IVPB mini-bag plus  Once     Route: Intravenous  Ordered Dose: 1 g     Last MAR action:  New Bag Carlester Kasparek BRIAN    04/16/18 0212 04/16/18 0211  azithromycin (ZITHROMAX) 500 mg in sodium chloride 0.9 % 250 mL IVPB  Once     Route: Intravenous  Ordered Dose: 500 mg     Acknowledged MCLAUGHLIN, JESSICA N    04/16/18 0212 04/16/18 0211  levETIRAcetam (KEPPRA) IVPB 1,000 mg 100 mL  Once     Route: Intravenous  Ordered Dose: 1,000 mg     Last MAR action:  Stopped Yul Diana BRIAN    04/16/18 0152 04/16/18 0151  midazolam (VERSED) injection 2 mg  Once     Route: Intravenous  Ordered Dose: 2 mg     Last MAR action:  Given Javonda Suh BRIAN    04/16/18 0025 04/16/18 0024  ondansetron (ZOFRAN) injection 4 mg  Once     Route: Intravenous  Ordered Dose: 4 mg     Last MAR action:  Given Angeletta Goelz BRIAN    04/16/18 0025 04/16/18 0024  famotidine (PEPCID) injection 20 mg  Once     Route: Intravenous  Ordered Dose: 20 mg     Last MAR action:  Given Preeti Winegardner BRIAN    04/16/18 0003 04/16/18 0002  sodium chloride 0.9 % bolus 1,000 mL  Once     Route: Intravenous  Ordered Dose: 1,000 mL     Last MAR action:  Stopped Teron Blais BRIAN            Procedures:      Chest Tube  Date/Time: 04/16/2018 4:26 AM  Performed by: Theresa Duty, MD  Authorized by: Theresa Duty, MD     Consent:     Consent obtained:  Verbal    Consent  given by:  Patient    Risks discussed:  Bleeding, incomplete drainage, damage to surrounding structures, infection and pain  Pre-procedure details:     Skin preparation:  Betadine and ChloraPrep  Sedation:     Sedation type:  Anxiolysis  Anesthesia (see MAR for exact dosages):     Anesthesia method:  Local infiltration    Local anesthetic:  Lidocaine 1% w/o epi  Procedure details:     Placement location:  L lateral    Scalpel size:  11    Tube size (Fr):  Minicatheter    Ultrasound guidance:  no      Tension pneumothorax: yes      Tube connected to:  Suction and Heimlich valve    Suture material:  2-0 silk    Dressing:  4x4 sterile gauze  Post-procedure details:     Post-insertion x-ray findings: tube in good position      Patient tolerance of procedure:  Tolerated well, no immediate complications          Interpretations:      O2 sat-           saturation: 92 %; Oxygen use: room air; Interpretation: borderline normal      Monitor -         interpreted by me: normal sinus at 84.                       CLINICAL SUMMARY          MDM Notes:      23 year old male with a history of seizures presents the ED after a seizure, on arrival the patient is quite tachypneic, he is having significant coughing, concern for respiratory process, he also did hit his head several days ago, and is now having recurrent seizures.  The patient is on a seizure medication I think likely seizures are more secondary to his illness but however we will obtain CT scan of the head.  Will obtain broad laboratory work-up, provide IV fluids.  Will obtain chest x-ray because of the cough with production    .     ED Course:    Chest x-ray shows large left-sided pneumothorax, after discussion with the patient and family they gave verbal agreement for left-sided chest tube placement, chest tube was placed successfully as per the procedure note above with good improvement in the patient's pneumothorax      Patient's laboratory work-up reveals elevated white  blood cell count I think this likely combination of hemoconcentration as well as seizure and illness, he does not any bandemia, mostly neutrophils.  No atypical lymphocytes or other signs of lymphoma.  Will need follow-up.  Patient's chest tube is currently to suction, no air leak noted, chest x-ray revealed small residual pneumothorax, he will require close follow-up repeat chest x-rays.  I started a gram of Keppra given the patient's recent seizure and no at home seizure medication, start antibiotics, and I spoke to the hospitalist agrees to evaluate the patient for admission.      Critical care time independent of procedures 37 minutes                 Diagnosis:        Final diagnoses:   Pneumothorax, unspecified type   Seizure         Disposition:      ED Disposition     ED Disposition Condition Date/Time Comment    Admit  Thu Apr 16, 2018  2:27 AM Admitting Physician: QUINDARIUS, CABELLO [[id number]]   Diagnosis: Pneumothorax [[id number]]   Estimated Length of Stay: > or = to 2 midnights   Tentative Discharge Plan?: Home or Self Care [1]   Patient Class: Inpatient [101]            Condition at disposition:  Serious    New Prescriptions    No medications on file               RESULTS        Lab Results:      Results  Procedure Component Value Units Date/Time    Lipase [[id number]] Collected:  04/16/18 0007    Specimen:  Blood Updated:  04/16/18 0315     Lipase 52 U/L     GFR [[id number]] Collected:  04/16/18 0007     Updated:  04/16/18 0059     EGFR >60.0    Comprehensive metabolic panel [[id number]]  (Abnormal) Collected:  04/16/18 0007    Specimen:  Blood Updated:  04/16/18 0059     Glucose 174 mg/dL      BUN 08.6 mg/dL      Creatinine 1.3 mg/dL      Sodium 578 mEq/L      Potassium 4.3 mEq/L      Chloride 97 mEq/L      CO2 17 mEq/L      Calcium 10.2 mg/dL      Protein, Total 8.3 g/dL      Albumin 4.2 g/dL      AST (SGOT) 14 U/L      ALT 11 U/L      Alkaline Phosphatase 93 U/L      Bilirubin, Total 0.9 mg/dL      Globulin  4.1 g/dL      Albumin/Globulin Ratio 1.0    CBC and differential [[id number]]  (Abnormal) Collected:  04/16/18 0007    Specimen:  Blood Updated:  04/16/18 0043     WBC 28.77 x10 3/uL      Hgb 16.0 g/dL      Hematocrit 41.3 %      Platelets 608 x10 3/uL      RBC 5.82 x10 6/uL      MCV 77.7 fL      MCH 27.5 pg      MCHC 35.4 g/dL      RDW 12 %      MPV 10.3 fL      Neutrophils 70.6 %      Lymphocytes Automated 9.9 %      Monocytes 9.2 %      Eosinophils Automated 8.8 %      Basophils Automated 0.6 %      Immature Granulocyte 0.9 %      Nucleated RBC 0.0 /100 WBC      Neutrophils Absolute 20.29 x10 3/uL      Abs Lymph Automated 2.86 x10 3/uL      Abs Mono Automated 2.65 x10 3/uL      Abs Eos Automated 2.53 x10 3/uL      Absolute Baso Automated 0.17 x10 3/uL      Absolute Immature Granulocyte 0.27 x10 3/uL      Absolute NRBC 0.00 x10 3/uL               Radiology Results:      XR Chest  AP Portable   Final Result          Interval placement of a left basilar pigtail chest tube with significant   interval decrease in size of the left pneumothorax, currently measuring   up to 2.5 cm apically. Left midlung and left basilar atelectasis with   possible trace pleural effusion.      Eloise Harman, MD    04/16/2018 2:58 AM      XR Chest 2 Views   Final Result          Large left pneumothorax with possible tension component.            These critical findings were discussed with Dr. Elna Breslow  Janai Brannigan at 04/16/2018 1:34 AM.      Eloise Harman, MD    04/16/2018 1:37 AM      CT Head without Contrast   Final Result         No acute intracranial abnormality.       Eloise Harman, MD    04/16/2018 12:56 AM                Scribe Attestation:      I was acting as a Neurosurgeon for Tera Partridge, MD on [employer]  Treatment Team: Scribe: Mindi Junker    I am the first provider for this patient and I personally performed the services documented. Treatment Team: Scribe: Mindi Junker is scribing for me on  Morrish,Doye J. This note accurately reflects work and decisions made by me.  Tera Partridge, MD            Theresa Duty, MD  04/16/18 [phone]

## 2018-04-17 ENCOUNTER — Inpatient Hospital Stay: Payer: BLUE CROSS/BLUE SHIELD

## 2018-04-17 LAB — CBC
Absolute NRBC: 0 10*3/uL (ref 0.00–0.00)
Hematocrit: 37.4 % — ABNORMAL LOW (ref 37.6–49.6)
Hgb: 12.4 g/dL — ABNORMAL LOW (ref 12.5–17.1)
MCH: 27.1 pg (ref 25.1–33.5)
MCHC: 33.2 g/dL (ref 31.5–35.8)
MCV: 81.7 fL (ref 78.0–96.0)
MPV: 10.3 fL (ref 8.9–12.5)
Nucleated RBC: 0 /100 WBC (ref 0.0–0.0)
Platelets: 386 10*3/uL — ABNORMAL HIGH (ref 142–346)
RBC: 4.58 10*6/uL (ref 4.20–5.90)
RDW: 13 % (ref 11–15)
WBC: 12.75 10*3/uL — ABNORMAL HIGH (ref 3.10–9.50)

## 2018-04-17 LAB — BASIC METABOLIC PANEL
BUN: 7 mg/dL — ABNORMAL LOW (ref 9.0–28.0)
CO2: 23 mEq/L (ref 22–29)
Calcium: 8.3 mg/dL — ABNORMAL LOW (ref 8.5–10.5)
Chloride: 106 mEq/L (ref 100–111)
Creatinine: 0.8 mg/dL (ref 0.7–1.3)
Glucose: 87 mg/dL (ref 70–100)
Potassium: 3.7 mEq/L (ref 3.5–5.1)
Sodium: 141 mEq/L (ref 136–145)

## 2018-04-17 LAB — GFR: EGFR: >60

## 2018-04-17 MED ORDER — PIPERACILLIN SOD-TAZOBACTAM SO 4.5 (4-0.5) G IV SOLR
4.50 g | Freq: Three times a day (TID) | INTRAVENOUS | Status: DC
Start: 2018-04-17 — End: 2018-04-20
  Administered 2018-04-17 – 2018-04-20 (×9): 4.5 g via INTRAVENOUS
  Filled 2018-04-17 (×9): qty 20

## 2018-04-17 MED ORDER — GADOBUTROL 1 MMOL/ML IV SOLN
0.10 mmol/kg | Freq: Once | INTRAVENOUS | Status: AC | PRN
Start: 2018-04-17 — End: 2018-04-17
  Administered 2018-04-17: 7 mmol via INTRAVENOUS

## 2018-04-17 MED ORDER — VANCOMYCIN 1000 MG IN 250 ML NS IVPB VIAL-MATE (CNR)
1000.00 mg | Freq: Two times a day (BID) | INTRAVENOUS | Status: DC
Start: 2018-04-17 — End: 2018-04-18
  Administered 2018-04-17: 16:00:00 1000 mg via INTRAVENOUS
  Filled 2018-04-17: qty 250

## 2018-04-17 MED ORDER — MELATONIN 3 MG PO TABS
3.00 mg | ORAL_TABLET | Freq: Once | ORAL | Status: AC
Start: 2018-04-17 — End: 2018-04-17
  Administered 2018-04-17: 03:00:00 3 mg via ORAL
  Filled 2018-04-17: qty 1

## 2018-04-17 NOTE — UM Notes (Signed)
04/16/18 0227  Admit to Inpatient         23 y.o. male with PMH of seizure d/o (not on any AED) admitted with Seizure at home and subsequently found to have left pneumothorax after about 3 months of persistent cough and 5 days of likely viral gastroenteritis, s/p Left CT in the ED. Stable on RA    Plan:  #seizure-- negative head CT, s/p versed in the ED, Keppra loaded with 1 gram and will continue Keppra 500mg  IV BID for now. Neurology f/u    # Left pneumothorax- continue CT to suction -20cm H20, pulm consult (Dr. Mayford Knife) placed in epic for management.   Daily CXR for tomorrow AM ordered, Lidocaine patch for site, Tylenol PRN     # Hyponatremia Hypochloremic Gapped Metabolic acidosis   - likely related to recent GI illness, expect viral gastroenteritis; Give Plasmalyte 100cc/hour x 10 hours, received 1L bolus IVF in the ED   - repeat BMP , F/U UA     Pulmonology consult:   IMP:    1.  Spontaneous PTX with tension- in the setting of acute seizure.  May have increased intrathoracic pressure resulting in this.  Is thin male - may have blebs  2.  Seizure disorder    REC:    1.  Chest tube to water seal  2.  CT chest  3.  CXR in AM if resolved then clamp x 4 hours and repeat CXR    Neurology consult:   Assessment:   23 y/o male with history of ADHD and epilepsy (no AEDs) who presented on 04/16/18 with:    1. Seizure - likely lowered seizure threshold in setting of recent GI illness and sleep deprivation      Plan:   - Recommend MRI brain w/wo contrast, seizure protocol - ordered  - Recommend rEEG - ordered  - Continue Keppra 500 mg BID  - Seizure precautions  - Medical management per primary team  - Further recommendations per attending neurologist     Vs-97.6, 146, 18, 131/98, 92% ra,   Labs: wbc 28.77, plt 609, glucose 174, urine drug screen: positive for benzodiazepine & cannabinoid screen,     Cxr: Large left pneumothorax with possible tension component.  2nd cxr: Interval placement of a left basilar pigtail  chest tube with significant  interval decrease in size of the left pneumothorax, currently measuring  up to 2.5 cm apically. Left midlung and left basilar atelectasis with  possible trace pleural effusion    3rd cxr: 1. Small to moderate left-sided hydropneumothorax with cavitary focus  along the left major fissure which is felt to represent loculated  hydropneumothorax. An intrapulmonary cavitary lesion is felt to be less  likely. Continued follow-up recommended.    rec'd iv zithromax x 1, iv rocephin x 1, iv keppra x 1, iv versed x 1, iv zofran x 1, ivf bolus x 1 liter in ed.   Reg diet, albuterol nebs q6h, pulmicort neb bid, iv keppra bid,  lovenox sq qd, ivf @ 100, 24 hr telemetry, chest tube to water seal, telemetry, MRI brain,     Cordelia Pen MSN, [employer], ACM  Utilization Review Case Manager  [employer]  [phone]

## 2018-04-17 NOTE — Plan of Care (Signed)
Vital Signs (Abnormalities, treatments, trends): VSS  Weight (Trend if applicable, i.e., diuresing): standing  Abnormal Labs (Orders needed): pt. Requested to be done after 0800  Pain: tylenol Q6. Lidocaine patch  Telemetry: Yes Cont. Nsr, Brady with sleep  Respiratory (Increase or decrease of O2, RA, mode of O2,): RA  If using Bipap or Cpap, any barriers? : Yes or No, time of use? N/A  Ambulatory Sats needed. Yes/No. Completed? N/A  GI/GU (Net output for shift, changes in BM): continentx2  LDA (Difficult access, line removal, output): PIV  Nutrition (Diabetic, FR, NPO): Regular  Activity/Safety (PT/OT orders, sitters): standby  Plan/Questions/Orders needed:  -MRI of a brain-IV Keppra  -Pain mgt  -chest tube to water seal  -nebs

## 2018-04-17 NOTE — Progress Notes (Addendum)
Case Management Progress Note      Situation: 23 yr male - no significant medical hx, remote hx of seizure x 2 - admitted with seizure after fall and spontaneous pneumothorax      Background: AOx4. Lives with parents. Independent. Drives      Assessment: on Room Air, chest tube placed, MRI ordered. EEG ordered      Request: no discharge needs indicated at this time. Case management to monitor for any needs.          04/17/18 1557   Patient Type   Within 30 Days of Previous Admission? No   Healthcare Decisions   Interviewed: Patient   Orientation/Decision Making Abilities of Patient Alert and Oriented x3, able to make decisions   Advance Directive Patient does not have advance directive   Healthcare Agent Appointed No   Prior to admission   Prior level of function Independent with ADLs;Ambulates independently   Type of Residence Private residence   Home Layout Multi-level   Living Arrangements Parent;Family members   How do you get to your MD appointments? drives   Dressing Independent   Grooming Independent   Feeding Independent   Bathing Independent   Toileting Independent   DME Currently at Home Other (Comment)  (none)   Name of Prior Assisted Living Facility n/a   Home Care/Community Services None   Prior SNF admission? (Detail) n/a   Prior Rehab admission? (Detail) n/a   Adult Protective Services (APS) involved? No   Discharge Planning   Support Systems Parent;Family members   Patient expects to be discharged to: home   Anticipated Liberal plan discussed with: Same as interviewed   Mode of transportation: Private car (family member)   Consults/Providers   PT Evaluation Needed 2   OT Evalulation Needed 2   SLP Evaluation Needed 2   PCP   PCP on file was verified as the current PCP? Yes   Important Message from Medicare Notice   Patient received 1st IMM Letter? n/a         Warden Fillers RN BSN  Case Manager  [place]  [phone]

## 2018-04-17 NOTE — Progress Notes (Signed)
[facility] Consultation Progress Note    Date Time: 04/17/18 9:50 AM  Patient Name: Craig Phelps, Craig Phelps  Outpatient Neurologist :    CC: Seizure        Assessment:   23 y/o male with history of ADHD and epilepsy (no AEDs) who presented on 04/16/18 with:    1. Seizure - likely provoked in setting of recent GI illness and sleep deprivation       - rEEG unremarkable    Patient Active Problem List   Diagnosis    Pilonidal cyst    Seizure    Pneumothorax    GERD (gastroesophageal reflux disease)       Plan:   - rEEG done, report reviewed  - MRI brain w/wo contrast, seizure protocol - done, report pending  - Continue Keppra 500 mg BID  - Seizure precautions, no driving until seizure free x 6 months  - Medical management per primary team  - Further recommendations per attending neurologist       Attending note:    The patient was seen and examined by me. Interval history reviewed. All pertinent parts of the neurological exam were performed and confirmed by me, with any additional findings on exam noted in bold. I agree with the assessment and plan as outlined by the mid-level provider as above, with any additional considerations or recommendations as noted.    No new events though patient feels like he did a few days ago with his pre-hospital illness. Tolerating Keppra. No additional seizures reported.  Exam - normal/unchanged,  Seizure - w/u has been unrevealing here. Symptoms triggered by 6 days of sleep deprivation with prehospital illness. Prior events a few years ago triggered after stopping EtOH binging. At this point, after discussing with pt, he will continue on Keppra 500mg  BID. No driving til seizure free x 6 months. May consider an ambulatory EEG as outpt.  Continued medical management RE: pneumothorax.  No other neuro recommendations at this time - please call with questions.    Gavino Fouch B. Stacy Gardner, MD  [facility]    Interval History/Subjective:   Pt states that he is feeling a little worse  today. He denies headaches, weakness or paresthesias.    04/16/2018 rEEG showed a normal waking and drowsy recording, with technical factors as noted above.  No unequivocal discharges or seizures noted.  However, normal study would not entirely exclude the possibility of seizures.  Correlation is recommended.    Mri Brain W Wo Contrast    Result Date: 04/17/2018  1.  No intracranial abnormality is detected. 2. The signal pattern and volume of the hippocampal formation is normal bilaterally. 3. There is a normal pattern of enhancement after the administration of contrast material. Theodoro Doing, MD 04/17/2018 1:47 PM    Medications:     Current Facility-Administered Medications   Medication Dose Route Frequency    albuterol  2.5 mg Nebulization Q6H SCH    budesonide  0.25 mg Nebulization BID    enoxaparin  40 mg Subcutaneous Daily    levETIRAcetam  500 mg Intravenous Q12H SCH    lidocaine  1 patch Transdermal Q24H    melatonin  3 mg Oral QHS    pantoprazole  40 mg Oral QAM AC       Review of Systems:   As per HPI    Physical Exam:   Temp:  [97.5 F (36.4 C)-98.8 F (37.1 C)] 98.8 F (37.1 C)  Heart Rate:  [73-90] 90  Resp Rate:  [15-22] 22  BP: (113-147)/(75-99) 126/76    Vital Signs:  Reviewed    General: The patient was well developed and well nourished.  No acute distress. Cooperative with the exam  Extremities: no pedal edema, extremities normal in color    Mental Status: The patient is awake, alert and oriented to person, place, and time.  Affect is normal  Fund of knowledge appropriate  Recent and remote memory are intact   Attention span and concentration appear normal.  Language function is normal. There is no evidence of aphasia in conversational speech.    Cranial nerves:   -CN III, IV, VI: Pupils equal, round, and reactive to light; extraocular movements intact; no ptosis   -CN V: Facial sensation intact in V1 through V3 distributions   -CN VII: Face symmetric   -CN VIII: Hearing intact to  conversational speech   -CN IX, X: Normal phonation   -CN XII: Tongue protrudes midline    Motor: Muscle tone normal without spasticity or flaccidity. No atrophy.    Strength          R / L                                         R / L  Deltoid             5 / 5                 Hip Flexion      5 / 5  Triceps            5 / 5                 Hip extension  5 / 5  Biceps             5 / 5                  Dorsiflexion     5 / 5  FF                   5 / 5                 Plantar flexion 5 / 5    Sensory:   Light touch intact.    Reflexes:  R / L     R / L  Biceps  2 / 2  Knees  2 / 2  BR                   2 / 2  Plantars Flexor / Flexor    Coordination:  No tremors    Gait: Station normal, gait stable       Labs:   Labs reviewed. Managed by primary team.     Results     Procedure Component Value Units Date/Time    Basic Metabolic Panel [[id number]]  (Abnormal) Collected:  04/17/18 0444    Specimen:  Blood Updated:  04/17/18 0547     Glucose 87 mg/dL      BUN 7.0 mg/dL      Creatinine 0.8 mg/dL      Calcium 8.3 mg/dL      Sodium 782 mEq/L      Potassium 3.7 mEq/L      Chloride 106 mEq/L      CO2 23 mEq/L  GFR [[id number]] Collected:  04/17/18 0444     Updated:  04/17/18 0547     EGFR >60.0    CBC without differential [[id number]]  (Abnormal) Collected:  04/17/18 0444    Specimen:  Blood Updated:  04/17/18 0530     WBC 12.75 x10 3/uL      Hgb 12.4 g/dL      Hematocrit 57.8 %      Platelets 386 x10 3/uL      RBC 4.58 x10 6/uL      MCV 81.7 fL      MCH 27.1 pg      MCHC 33.2 g/dL      RDW 13 %      MPV 10.3 fL      Nucleated RBC 0.0 /100 WBC      Absolute NRBC 0.00 x10 3/uL           Rads:   Xr Chest 2 Views    Result Date: 04/16/2018   Large left pneumothorax with possible tension component. These critical findings were discussed with Dr. Elna Breslow [facility] at 04/16/2018 1:34 AM. Eloise Harman, MD 04/16/2018 1:37 AM    Ct Head Without Contrast    Result Date: 04/16/2018  No acute intracranial abnormality. Eloise Harman, MD 04/16/2018 12:56 AM    Ct Chest Wo Contrast    Result Date: 04/16/2018  1. Small to moderate left-sided hydropneumothorax with cavitary focus along the left major fissure which is felt to represent loculated hydropneumothorax. An intrapulmonary cavitary lesion is felt to be less likely. Continued follow-up recommended. Launa Flight, MD 04/16/2018 1:24 PM    Xr Chest  Ap Portable    Result Date: 04/16/2018   Interval placement of a left basilar pigtail chest tube with significant interval decrease in size of the left pneumothorax, currently measuring up to 2.5 cm apically. Left midlung and left basilar atelectasis with possible trace pleural effusion. Eloise Harman, MD 04/16/2018 2:58 AM        Signed by:  Barnie Mort, PA-C  Physician Assistant  [facility]  Daytime: Spectralink [phone]  Consult requests: [employer]ralink [phone]  After 5:00 pm: [phone]    Please see attending Neurologist note that accompanies this mid-level encounter note.

## 2018-04-17 NOTE — Progress Notes (Signed)
[facility]  PROGRESS NOTE    [phone] [facility])  [phone] (On call - After hours)    Date Time: 04/17/18 9:41 AM  Patient Name: KUNAL, LEVARIO     Patient Active Problem List   Diagnosis    Pilonidal cyst    Seizure    Pneumothorax    GERD (gastroesophageal reflux disease)          Interval History   Patient gives history of preceding illness with recurrent vomiting, fever , and purulent non-foul smelling sputum - CT shows round cavity with thick wall on 1 side worrisome for potential lung abscess       Meds:     Scheduled Meds:  Current Facility-Administered Medications   Medication Dose Route Frequency    albuterol  2.5 mg Nebulization Q6H SCH    budesonide  0.25 mg Nebulization BID    enoxaparin  40 mg Subcutaneous Daily    levETIRAcetam  500 mg Intravenous Q12H SCH    lidocaine  1 patch Transdermal Q24H    melatonin  3 mg Oral QHS    pantoprazole  40 mg Oral QAM AC     Continuous Infusions:  PRN Meds:.acetaminophen, benzonatate, LORazepam, naloxone, ondansetron **OR** ondansetron      Physical Exam:     Vitals:    04/17/18 0754   BP: 126/76   Pulse: 90   Resp: 22   Temp: 98.8 F (37.1 C)   SpO2: 97%     Temp (24hrs), Avg:98.3 F (36.8 C), Min:97.5 F (36.4 C), Max:98.8 F (37.1 C)      General: Alert in NAD  Neck:     Supple with normal CVP  Chest:    Clear  Heart:     Regular  Abdomen: Soft  Extremities: No edema  Other:    Input/Output:  Intake and Output Summary (Last 24 hours) at Date Time    Intake/Output Summary (Last 24 hours) at 04/17/2018 0941  Last data filed at 04/17/2018 0700  Gross per 24 hour   Intake --   Output 1190 ml   Net -1190 ml           CT shows minimal residual pneumothorax - rounded density on left mid lung with thickened wall on 1 side - adjacent LLL infiltrate is present      Labs:     Recent Labs   Lab 04/17/18  0444 04/16/18  0341 04/16/18  0007   WBC 12.75* 15.31* 28.77*   RBC 4.58 4.83 5.82   Hgb 12.4* 13.3 16.0    Hematocrit 37.4* 38.2 45.2   Glucose 87 104* 174*   BUN 7.0* 13.0 15.0   Creatinine 0.8 1.0 1.3   Calcium 8.3* 8.5 10.2   Sodium 141 137 134*   Potassium 3.7 3.9 4.3   Chloride 106 103 97*   CO2 23 21* 17*           Assessment:   1.  Spontaneous PTX with tension- in the setting of acute seizure.  May have increased intrathoracic pressure resulting in this.  Is thin male - may have blebs  2.  Seizure disorder  3. Suspect anerobic or staph pneumonia with associated lung abscess - possible rupture to pleural space    Plan:  1. Chest tube to suction - need to completely obliterate pleural space  2. Induce sputum for culture  3. Would start Vancomycin and Zosyn now  4. Follow-up CXR  5. Neuro re-evaluation in  progress    Will discuss with covering team.          Signed by: Hinton Rao, MD

## 2018-04-17 NOTE — Procedures (Signed)
Neurodiagnostic Laboratory  Electroencephalogram Report    Date(s) of review: 04/16/2018  Patient Name:  Craig Phelps, Craig Phelps  DOB:  [birth year]  Sex:  male  MRN: [id number]    Summary of findings:    The background tracing consisted of 30 to 40 V activity in the 9 to 10 Hz alpha range.  Drowsiness was seen with mild slowing the background.  Stage II sleep was not clearly seen.  Intermittent photic stimulation did not produce a clear driving response.  Hyperventilation was not performed presumably secondary to the patient's medical condition.  Typical myogenic and electrode artifacts were seen, with persistent myogenic artifact seen over the frontotemporal regions, left more than right, throughout the tracing.  No unequivocal discharges or seizures seen.  EKG showed a regular rhythm.    Interpretation/Impression:    This appears to be a normal waking and drowsy recording, with technical factors as noted above.  No unequivocal discharges or seizures noted.  However, normal study would not entirely exclude the possibility of seizures.  Correlation is recommended.          Indication:   Active Hospital Problems    Diagnosis    Pneumothorax    GERD (gastroesophageal reflux disease)    Seizure       Medications:   Current Facility-Administered Medications   Medication Dose Route Frequency    albuterol  2.5 mg Nebulization Q6H SCH    budesonide  0.25 mg Nebulization BID    enoxaparin  40 mg Subcutaneous Daily    levETIRAcetam  500 mg Intravenous Q12H SCH    lidocaine  1 patch Transdermal Q24H    melatonin  3 mg Oral QHS    pantoprazole  40 mg Oral QAM AC    piperacillin-tazobactam  4.5 g Intravenous Q8H    vancomycin  1,000 mg Intravenous Q12H       Fatema Rabe B. Stacy Gardner, MD  [facility]  Board Certified in Neurology, Clinical Neurophysiology, Sleep Medicine

## 2018-04-17 NOTE — Progress Notes (Signed)
MEDICINE PROGRESS NOTE    Date Time: 04/17/18 6:34 PM  Patient Name: Craig Phelps  Attending Physician: Marene Lenz, MD    Assessment:   23yo male with h/o seizure disorder, not on AED, who presented after having seizure at home. Pt has been ill for the past 3 weeks with bronchitis and possible GI illness, which pt thinks lowered his seizure threshold. In the ER, pt noted to have L pneumothorax. Chest tube was placed by the ER. Pt now reports feeling improved.     # L sided pneumothorax  # L sided hydropneumothorax with cavity focus - concerning for abscess  # seizure - with h/o seizure d/o, no on AEDs  # hyponatremia - related to volume depletion, improving  # vomiting - improving  # leukocytosis - improving  # dehydration    Plan:   - pulmonary consulted, appreciate recs  - pt started on vancomycin and zosyn. Pharmacy to dose vancomycin  - chest tube to suction  - daily CXR  - check sputum ctx  - albuterol nebs, budesonide nebs BID (discussed with pulm)  - neurology consulted for seizure disorder, appreciate recs  - EEG without evidence of seizure  - MRI brain with no acute abnormality  - cont keppra 500mg  BID  - no driving for 6 months, pt aware  DVT ppx: lovenox    Case discussed with: pt, pt's family member at bedside, pulm, neuro    Safety Checklist:     DVT prophylaxis:  CHEST guideline (See page [id number]) Chemical   Foley:  [place] R[facility] Foley protocol Not present   IVs:  Peripheral IV   PT/OT: Not needed   Daily CBC & or Chem ordered:  SHM/ABIM guidelines (see #5) Yes, due to clinical and lab instability   Reference for approximate charges of common labs: CBC auto diff - $76   BMP - $99   Mg - $79    Lines:     Patient Lines/Drains/Airways Status    Active PICC Line / CVC Line / PIV Line / Drain / Airway / Intraosseous Line / Epidural Line / ART Line / Line / Wound / Pressure Ulcer / NG/OG Tube     Name:   Placement date:   Placement time:   Site:   Days:    Peripheral IV 04/16/18 Right Antecubital    04/16/18    0008    Antecubital   less than 1                 Disposition: (Please see PAF column for Expected D/C Date)   Today's date: 04/17/2018   Admit Date: 04/16/2018 12:05 AM   LOS: 1  Clinical Milestones: seizure, chest tube  Anticipated discharge needs: f/u with neurology      Subjective     CC: Pneumothorax    Interval History/24 hour events: no events    HPI/Subjective: pt states he was feeling worse earlier today but now improving, feels better after chest tube placed to suction.    Review of Systems:     As per HPI    Physical Exam:     VITAL SIGNS PHYSICAL EXAM   Temp:  [97.5 F (36.4 C)-98.8 F (37.1 C)] 98.6 F (37 C)  Heart Rate:  [79-103] 89  Resp Rate:  [16-22] 20  BP: (102-147)/(64-99) 102/64        Intake/Output Summary (Last 24 hours) at 04/17/2018 1834  Last data filed at 04/17/2018 0700  Gross per 24  hour   Intake --   Output 1190 ml   Net -1190 ml    Physical Exam  General: awake, alert X 3  Cardiovascular: regular rate and rhythm, no murmurs, rubs or gallops  Lungs: clear to auscultation bilaterally,  No wheezing,  rhonchi, or rales  Abdomen: soft, non-tender, non-distended; no palpable masses,  normoactive bowel sounds  Extremities: no edema          Meds:     Medications were reviewed:  Current Facility-Administered Medications   Medication Dose Route Frequency    albuterol  2.5 mg Nebulization Q6H SCH    budesonide  0.25 mg Nebulization BID    enoxaparin  40 mg Subcutaneous Daily    levETIRAcetam  500 mg Intravenous Q12H SCH    lidocaine  1 patch Transdermal Q24H    melatonin  3 mg Oral QHS    pantoprazole  40 mg Oral QAM AC    piperacillin-tazobactam  4.5 g Intravenous Q8H    vancomycin  1,000 mg Intravenous Q12H     Current Facility-Administered Medications   Medication Dose Route Frequency Last Rate     Current Facility-Administered Medications   Medication Dose Route    acetaminophen  650 mg Oral    benzonatate  100 mg Oral    naloxone  0.2 mg Intravenous    ondansetron   4 mg Oral    Or    ondansetron  4 mg Intravenous         Labs:     Labs (last 72 hours):    Recent Labs   Lab 04/17/18  0444 04/16/18  0341   WBC 12.75* 15.31*   Hgb 12.4* 13.3   Hematocrit 37.4* 38.2   Platelets 386* 432*          Recent Labs   Lab 04/17/18  0444 04/16/18  0341 04/16/18  0007   Sodium 141 137 134*   Potassium 3.7 3.9 4.3   Chloride 106 103 97*   CO2 23 21* 17*   BUN 7.0* 13.0 15.0   Creatinine 0.8 1.0 1.3   Calcium 8.3* 8.5 10.2   Albumin  --   --  4.2   Protein, Total  --   --  8.3   Bilirubin, Total  --   --  0.9   Alkaline Phosphatase  --   --  93   ALT  --   --  11   AST (SGOT)  --   --  14   Glucose 87 104* 174*                   Microbiology, reviewed and are significant for:  Microbiology Results     None          Imaging, reviewed and are significant for:  CXR:  IMPRESSION:     1. Left pneumothorax increased now moderate. Discussed with patient's  nurse.      Signed by: Marene Lenz, MD

## 2018-04-17 NOTE — Plan of Care (Signed)
Patient alert and oriented x 4  Pt independent w/ADL's  No complaints of Sob  States pain at chest tube site is mild, did not need any pain med  States feels lightheaded this morning, felt he has eaten well the past few days  Pt given apple juice per request and felt better  Chest tube continues to be on suction   For MRI of brain today  Resp sputum obtained and IV abx started this afternoon  Family visiting at bedside  Pt not on O2 and tele d/c'd  Continue on IV abx, IV keppra, breathing tx  Continue hourly rounding

## 2018-04-18 ENCOUNTER — Inpatient Hospital Stay: Payer: BLUE CROSS/BLUE SHIELD

## 2018-04-18 DIAGNOSIS — J930 Spontaneous tension pneumothorax: Secondary | ICD-10-CM

## 2018-04-18 MED ORDER — ALBUTEROL SULFATE (2.5 MG/3ML) 0.083% IN NEBU
2.50 mg | INHALATION_SOLUTION | Freq: Four times a day (QID) | RESPIRATORY_TRACT | Status: DC
Start: 2018-04-19 — End: 2018-04-30
  Administered 2018-04-19 – 2018-04-29 (×28): 2.5 mg via RESPIRATORY_TRACT
  Filled 2018-04-18 (×33): qty 3

## 2018-04-18 MED ORDER — VANCOMYCIN 1000 MG IN 250 ML NS IVPB VIAL-MATE (CNR)
1000.00 mg | Freq: Three times a day (TID) | INTRAVENOUS | Status: DC
Start: 2018-04-18 — End: 2018-04-19
  Administered 2018-04-18 – 2018-04-19 (×4): 1000 mg via INTRAVENOUS
  Filled 2018-04-18 (×4): qty 250

## 2018-04-18 NOTE — Progress Notes (Signed)
Patient requested not to be awakened at night. Changed nebs to Q6 WA. Changes made per PT Driven Protocol. If you have any questions about this assessment, please contact the RT Charge Therapist at [phone]. Thank you.     04/18/18 2137   Patient Assessment   Status Completed   Subjective improved   Surgical Status None   Mobility Ambulatory with or without assistance   CXR 2/28 Redemonstration of a moderate left pneumothorax with a left  chest tube   Cough UTA   Heart Rate 99   Resp Rate 20   Bilateral Breath Sounds Diminished   Location Specific No   Home regimen   Home Treatments n  (will start new meds after going home)   Home Oxygen n   Home CPAP/BiLevel n   Oxygen Therapy   SpO2 97 %   O2 Device None (Room air)   Criteria for Therapy   Secretion Clearance (BPH) None   Lung Expansion None   Respiratory Medications New or pre-existing diagnosed airways disease   Oxygen None   Outcomes   Respiratory medication No change or improvement   Oxygen goals Maintain room air SpO2 > 90%   Recommendations   Respiratory Medications Decrease therapy   Comments   Comments patient does not want to be awakened at night   Performing Departments   O2 Device performing department formula [id number]   Resp assess adult performing department formula [id number]   Nebs given performing department formula [id number]

## 2018-04-18 NOTE — Plan of Care (Signed)
Vital Signs (Abnormalities, treatments, trends): VSS  Weight (Trend if applicable, i.e., diuresing): standing  Abnormal Labs (Orders needed): pt. Requested to be done after 0800  Pain: tylenol Q6. Lidocaine patch  Telemetry: Yes Cont. Nsr, Brady with sleep  Respiratory (Increase or decrease of O2, RA, mode of O2,): RA  If using Bipap or Cpap, any barriers? : Yes or No, time of use? N/A  Ambulatory Sats needed. Yes/No. Completed? N/A  GI/GU (Net output for shift, changes in BM): continentx2  LDA (Difficult access, line removal, output): PIV  Nutrition (Diabetic, FR, NPO): Regular  Activity/Safety (PT/OT orders, sitters): standby  Plan/Questions/Orders needed:  -IV Keppra  -Pain mgt  -chest tube to wall suction  -nebs

## 2018-04-18 NOTE — Progress Notes (Signed)
PULMONARY MEDICINE PROGRESS NOTE    Date Time: 04/18/18 4:13 PM  Patient Name: Craig Phelps Hospital Day: 3     Attending Physician:  [place] Constable, MD      Assessment:     Patient Active Problem List   Diagnosis   . Pilonidal cyst   . Seizure   . Pneumothorax   . GERD (gastroesophageal reflux disease)       1. Spontaneous PTX  2. Seizure d/o  3. Do not think there is an associated lung abscess     Recommendation:     abx pending sputum but can likely de-escalate therapy soon  Will trial tube to water seal, place back to suction if patient desaturates/chest pain/dyspnea etc  CXR daily      Interval History     Interval History/24 hr. Events: Feels pbetter  Some pleurisy    Review of Systems:   Review of Systems - reviewed and no change from admission    Physical Exam:     Vitals:    04/18/18 0805   BP: 129/77   Pulse: 100   Resp: 20   Temp: 98.2 F (36.8 C)   SpO2: 98%     Temp (24hrs), Avg:98.2 F (36.8 C), Min:97.8 F (36.6 C), Max:98.6 F (37 C)      General appearance - alert, well appearing, and in no distress, in bed  Mental status - alert, oriented to person, place, and time  Nose - normal and patent, no erythema, discharge or polyps  Neck - supple, no significant adenopathy  Chest - normal air movement, no wheezing, rhonchi or crackles  Heart - normal rate and regular rhythm  Abdomen - soft, nontender, nondistended, no masses or organomegaly  Neurological - alert, oriented, normal speech, no focal findings or movement disorder noted  Extremities - peripheral pulses normal, no pedal edema, no clubbing or cyanosis    Input/Output:     Intake and Output Summary (Last 24 hours) at Date Time    Intake/Output Summary (Last 24 hours) at 04/18/2018 1613  Last data filed at 04/18/2018 0715  Gross per 24 hour   Intake --   Output 330 ml   Net -330 ml       Meds:     Current Facility-Administered Medications   Medication Dose Route Frequency   . albuterol  2.5 mg Nebulization Q6H SCH   . budesonide  0.25 mg  Nebulization BID   . enoxaparin  40 mg Subcutaneous Daily   . levETIRAcetam  500 mg Intravenous Q12H SCH   . lidocaine  1 patch Transdermal Q24H   . melatonin  3 mg Oral QHS   . pantoprazole  40 mg Oral QAM AC   . piperacillin-tazobactam  4.5 g Intravenous Q8H   . vancomycin  1,000 mg Intravenous Q8H       Labs:     Recent Labs   Lab 04/17/18  0444 04/16/18  0341 04/16/18  0007   WBC 12.75* 15.31* 28.77*   RBC 4.58 4.83 5.82   Hgb 12.4* 13.3 16.0   Hematocrit 37.4* 38.2 45.2   Glucose 87 104* 174*   BUN 7.0* 13.0 15.0   Creatinine 0.8 1.0 1.3   Calcium 8.3* 8.5 10.2   Sodium 141 137 134*   Potassium 3.7 3.9 4.3   Chloride 106 103 97*   CO2 23 21* 17*         Radiology:     Radiology Results (24 Hour)  Procedure Component Value Units Date/Time    X-ray chest AP portable [[id number]] Collected:  04/18/18 1038    Order Status:  Completed Updated:  04/18/18 1043    Narrative:       HISTORY: Left chest tube    TECHNIQUE: AP portable radiograph of the chest was obtained.    COMPARISON: Chest radiograph from 04/17/2018    FINDINGS:      There is redemonstration of a moderate left pneumothorax with a left  chest tube is unchanged. The right lung is clear. The heart size is  normal.        Impression:            Redemonstration of a moderate left pneumothorax with a left chest  tube which is unchanged when compared to the chest radiograph from  04/17/2018.        Carolyn Stare, MD   04/18/2018 10:39 AM          Imaging personally reviewed, including:         Signed by: Laurel Dimmer, MD  Please tiger text first   Mobile # [phone]  [facility] [place], [place]  [phone]  [phone] [facility] #1)  [phone] [facility] #2)

## 2018-04-18 NOTE — Progress Notes (Signed)
Vancomycin Day #2  Indication: Possible pneumonia    Maintenance Dose: 1g IV q8h  Goal Trough: 15-20  Level due: Trough prior to 4th dose due 2/29 at 16:30    Age: 23 y.o.  Recent Labs     04/17/18  0444 04/16/18  0341 04/16/18  0007   WBC 12.75* 15.31* 28.77*   Creatinine 0.8 1.0 1.3   BUN 7.0* 13.0 15.0     Wt Readings from Last 1 Encounters:   04/17/18 70.3 kg (154 lb 15.7 oz)     Estimated Creatinine Clearance: 141.6 mL/min (based on SCr of 0.8 mg/dL).    Cultures:   2/27 Sputum- in process    Comments/Recommendations:   23 y.o male initiated on Vancomycin for possible pneumonia.  He was on Vancomycin 1g IV q12h and received one dose. Due to Scr improving significantly, will adjust to 1g IV q8h due to age, weight, indication and CrCl.  Trough prior to 4th dose due 2/29.    Thank you for consulting pharmacy with assisting in Vancomycin dosing.    Ramiro Harvest, PharmD, BCPS  Ext [phone]    Reference:  Vancomycin Dosing Guideline for Adults

## 2018-04-18 NOTE — Progress Notes (Signed)
MEDICINE PROGRESS NOTE    Date Time: 04/18/18 8:47 PM  Patient Name: Craig Phelps  Attending Physician: Arizona Constable, MD    Assessment:   23yo male with h/o seizure disorder, not on AED, who presented after having seizure at home. Pt has been ill for the past 3 weeks with bronchitis and possible GI illness, which pt thinks lowered his seizure threshold. In the ER, pt noted to have L pneumothorax. Chest tube was placed by the ER. Pt now reports feeling improved.     # L sided pneumothorax  # L sided hydropneumothorax with cavity focus - concerning for abscess  # seizure - with h/o seizure d/o, no on AEDs  # hyponatremia - related to volume depletion, improving  # vomiting - improving  # leukocytosis - improving  # dehydration    Plan:   - pulmonary consult appreciated tube to water seal  - pt started on vancomycin and zosyn. Pharmacy to dose vancomycin  - chest tube to water seal if tolerated  - daily CXR  - check sputum ctx  - albuterol nebs, budesonide nebs BID (discussed with pulm)  - neurology consulted for seizure disorder, appreciate recs  - EEG without evidence of seizure  - MRI brain with no acute abnormality  - cont keppra 500mg  BID  - no driving for 6 months, pt aware  DVT ppx: lovenox    Case discussed with: pt, pt's family member at bedside,     Safety Checklist:     DVT prophylaxis:  CHEST guideline (See page [id number]) Chemical   Foley:  [place] Rn Foley protocol Not present   IVs:  Peripheral IV   PT/OT: Not needed   Daily CBC & or Chem ordered:  SHM/ABIM guidelines (see #5) Yes, due to clinical and lab instability   Reference for approximate charges of common labs: CBC auto diff - $76   BMP - $99   Mg - $79    Lines:     Patient Lines/Drains/Airways Status    Active PICC Line / CVC Line / PIV Line / Drain / Airway / Intraosseous Line / Epidural Line / ART Line / Line / Wound / Pressure Ulcer / NG/OG Tube     Name:   Placement date:   Placement time:   Site:   Days:    Peripheral IV 04/16/18 Right  Antecubital   04/16/18    0008    Antecubital   less than 1                 Disposition: (Please see PAF column for Expected D/C Date)   Today's date: 04/18/2018   Admit Date: 04/16/2018 12:05 AM   LOS: 2  Clinical Milestones: seizure, chest tube  Anticipated discharge needs: f/u with neurology      Subjective     CC: Pneumothorax    Interval History/24 hour events: no events    HPI/Subjective: pt states he was feels better family on bedside.    Review of Systems:     As per HPI    Physical Exam:     VITAL SIGNS PHYSICAL EXAM   Temp:  [98.1 F (36.7 C)-98.2 F (36.8 C)] 98.1 F (36.7 C)  Heart Rate:  [99-100] 99  Resp Rate:  [20] 20  BP: (126-129)/(77-79) 126/79        Intake/Output Summary (Last 24 hours) at 04/18/2018 2047  Last data filed at 04/18/2018 1900  Gross per 24 hour   Intake --  Output 380 ml   Net -380 ml    Physical Exam  General: awake, alert X 3  Cardiovascular: regular rate and rhythm, no murmurs, rubs or gallops  Lungs: clear to auscultation bilaterally,  No wheezing,  rhonchi, or rales  Abdomen: soft, non-tender, non-distended; no palpable masses,  normoactive bowel sounds  Extremities: no edema          Meds:     Medications were reviewed:  Current Facility-Administered Medications   Medication Dose Route Frequency    albuterol  2.5 mg Nebulization Q6H SCH    budesonide  0.25 mg Nebulization BID    enoxaparin  40 mg Subcutaneous Daily    levETIRAcetam  500 mg Intravenous Q12H SCH    lidocaine  1 patch Transdermal Q24H    melatonin  3 mg Oral QHS    pantoprazole  40 mg Oral QAM AC    piperacillin-tazobactam  4.5 g Intravenous Q8H    vancomycin  1,000 mg Intravenous Q8H     Current Facility-Administered Medications   Medication Dose Route Frequency Last Rate     Current Facility-Administered Medications   Medication Dose Route    acetaminophen  650 mg Oral    benzonatate  100 mg Oral    naloxone  0.2 mg Intravenous    ondansetron  4 mg Oral    Or    ondansetron  4 mg Intravenous          Labs:     Labs (last 72 hours):    Recent Labs   Lab 04/17/18  0444 04/16/18  0341   WBC 12.75* 15.31*   Hgb 12.4* 13.3   Hematocrit 37.4* 38.2   Platelets 386* 432*          Recent Labs   Lab 04/17/18  0444 04/16/18  0341 04/16/18  0007   Sodium 141 137 134*   Potassium 3.7 3.9 4.3   Chloride 106 103 97*   CO2 23 21* 17*   BUN 7.0* 13.0 15.0   Creatinine 0.8 1.0 1.3   Calcium 8.3* 8.5 10.2   Albumin  --   --  4.2   Protein, Total  --   --  8.3   Bilirubin, Total  --   --  0.9   Alkaline Phosphatase  --   --  93   ALT  --   --  11   AST (SGOT)  --   --  14   Glucose 87 104* 174*                   Microbiology, reviewed and are significant for:  Microbiology Results     None          Imaging, reviewed and are significant for:  CXR:  IMPRESSION:     1. Left pneumothorax increased now moderate. Discussed with patient's  nurse.      Signed by: Arizona Constable, MD

## 2018-04-18 NOTE — Progress Notes (Addendum)
Pt alert and oriented, ambulated around the unit. On RA sats 97%. VSS. C/o lt side rib cage pain managed with prn tylenol. Chest tube to water seal as per Dr.Srinivas orders. On IV antibiotics and breathing tx. Pt refused bed alarm and fall mats.Encouraged to call for help when in need of assistance, no impulsiveness observed at this time. Bed in lowest position,call bell within easy reach,purposeful rounding is being performed. Will continue with plan of care.

## 2018-04-19 ENCOUNTER — Inpatient Hospital Stay: Payer: BLUE CROSS/BLUE SHIELD

## 2018-04-19 DIAGNOSIS — D721 Eosinophilia: Secondary | ICD-10-CM

## 2018-04-19 DIAGNOSIS — D72820 Lymphocytosis (symptomatic): Secondary | ICD-10-CM

## 2018-04-19 LAB — CBC
Absolute NRBC: 0 10*3/uL (ref 0.00–0.00)
Hematocrit: 37.6 % (ref 37.6–49.6)
Hgb: 12.7 g/dL (ref 12.5–17.1)
MCH: 27.8 pg (ref 25.1–33.5)
MCHC: 33.8 g/dL (ref 31.5–35.8)
MCV: 82.3 fL (ref 78.0–96.0)
MPV: 9.6 fL (ref 8.9–12.5)
Nucleated RBC: 0 /100 WBC (ref 0.0–0.0)
Platelets: 438 10*3/uL — ABNORMAL HIGH (ref 142–346)
RBC: 4.57 10*6/uL (ref 4.20–5.90)
RDW: 13 % (ref 11–15)
WBC: 12.85 10*3/uL — ABNORMAL HIGH (ref 3.10–9.50)

## 2018-04-19 LAB — BODY FLUID PH: Body Fluid pH: 7.87

## 2018-04-19 LAB — BASIC METABOLIC PANEL
BUN: 5 mg/dL — ABNORMAL LOW (ref 9.0–28.0)
CO2: 22 mEq/L (ref 22–29)
Calcium: 8.6 mg/dL (ref 8.5–10.5)
Chloride: 105 mEq/L (ref 100–111)
Creatinine: 0.9 mg/dL (ref 0.7–1.3)
Glucose: 81 mg/dL (ref 70–100)
Potassium: 4.1 mEq/L (ref 3.5–5.1)
Sodium: 139 mEq/L (ref 136–145)

## 2018-04-19 LAB — VANCOMYCIN, TROUGH: Vancomycin Trough: 12.5 ug/mL (ref 10.0–20.0)

## 2018-04-19 LAB — BODY FLUID CELL COUNT
Body Fluid Eosinophils: 85 %
Body Fluid Lymphocytes: 8 %
Body Fluid Monocyte/Macrophage Cells: 4 %
Body Fluid Polymorphonuclear Cell: 3 % (ref <25)
Body Fluid RBC: 4000 /mm3
Body Fluid WBC: 10820 /mm3

## 2018-04-19 LAB — GFR: EGFR: >60

## 2018-04-19 LAB — LACTATE DEHYDROGENASE, BODY FLUID: Body Fluid LDH: 1905 U/L

## 2018-04-19 LAB — GLUCOSE, BODY FLUID: Body Fluid Glucose: <5 mg/dL

## 2018-04-19 LAB — PROTEIN, BODY FLUID: Body Fluid Protein: 4.2 g/dL

## 2018-04-19 MED ORDER — VANCOMYCIN HCL IN NACL 1.25-0.9 GM/250ML-% IV SOLN
1250.00 mg | Freq: Three times a day (TID) | INTRAVENOUS | Status: DC
Start: 2018-04-19 — End: 2018-04-21
  Administered 2018-04-19 – 2018-04-21 (×7): 1250 mg via INTRAVENOUS
  Filled 2018-04-19: qty 1250
  Filled 2018-04-19 (×3): qty 250
  Filled 2018-04-19 (×2): qty 1250
  Filled 2018-04-19 (×2): qty 250
  Filled 2018-04-19: qty 1250

## 2018-04-19 NOTE — Plan of Care (Signed)
Problem: Moderate/High Fall Risk Score >5  Goal: Patient will remain free of falls  Outcome: Progressing     Problem: Safety  Goal: Patient will be free from injury during hospitalization  Outcome: Progressing     Problem: Safety  Goal: Patient will be free from infection during hospitalization  Outcome: Progressing     Problem: Psychosocial and Spiritual Needs  Goal: Demonstrates ability to cope with hospitalization/illness  Outcome: Progressing     Problem: Compromised Hemodynamic Status  Goal: Vital signs and fluid balance maintained/improved  Outcome: Progressing     Problem: Inadequate Gas Exchange  Goal: Adequate oxygenation and improved ventilation  Outcome: Progressing

## 2018-04-19 NOTE — Consults (Signed)
Pharmacy Consult to dose Vancomycin  Vancomycin Day #2  Indication: PNA  Maintenance Dose: 1000 mg IV q8  Goal Trough 15-20 mcg/ml  Level due: drawn ~ 0700 on 3/1 = 12.5 mcg/ml  Other anti-infectives: Zosyn  Age: 23 y.o.  Recent Labs     04/19/18  0703 04/17/18  0444   WBC 12.85* 12.75*   BUN 5.0* 7.0*   Creatinine 0.9 0.8     Wt Readings from Last 1 Encounters:   04/17/18 70.3 kg (154 lb 15.7 oz)     Estimated Creatinine Clearance: 125.8 mL/min (based on SCr of 0.9 mg/dL).  Is renal function stable? yes  Cultures: Sputum Cx = mixed upper resp flora  Comments/Recommendations: will increase Vancomycin to 1250 mg IV q8h for extrapolated trough 15-16 mcg/ml    Reference: Vancomycin Dose Guide for Adults    Thank you for allowing pharmacy to assist in Vancomycin dosing.    Andres Labrum [facility] BCPS  [facility] Pharmacist  Ext [phone]

## 2018-04-19 NOTE — Progress Notes (Signed)
PULMONARY MEDICINE PROGRESS NOTE    Date Time: 04/19/18 9:32 AM  Patient Name: [facility] Day: 4     Attending Physician:  Arizona Constable, MD      Assessment:     Patient Active Problem List   Diagnosis   . Pilonidal cyst   . Seizure   . Pneumothorax   . GERD (gastroesophageal reflux disease)       1. Spontaneous PTX  2. Seizure d/o  3. Do not think there is an associated lung abscess on reviewing with radiology but do think there is PNA    Recommendation:       I drew pleural fluid studies today  Placed tube back to suction  Replaced atrium  Daily CXR  Keep on abx for now  May ambulate on portable suction      Interval History     Interval History/24 hr. Events:Feeling better but did have som vague chest discomfort overnight    Review of Systems:   Review of Systems - reviewed and no change from admission    Physical Exam:     Vitals:    04/19/18 0715   BP: 128/70   Pulse: 77   Resp: 20   Temp: 98.4 F (36.9 C)   SpO2: 98%     Temp (24hrs), Avg:98.3 F (36.8 C), Min:98.1 F (36.7 C), Max:98.4 F (36.9 C)      General appearance - alert, well appearing, and in no distress, in bed  Mental status - alert, oriented to person, place, and time  Nose - normal and patent, no erythema, discharge or polyps  Neck - supple, no significant adenopathy  Chest - normal air movement, no wheezing, rhonchi or crackles  Tube tidaling well  Heart - normal rate and regular rhythm  Abdomen - soft, nontender, nondistended, no masses or organomegaly  Neurological - alert, oriented, normal speech, no focal findings or movement disorder noted  Extremities - peripheral pulses normal, no pedal edema, no clubbing or cyanosis    Input/Output:     Intake and Output Summary (Last 24 hours) at Date Time    Intake/Output Summary (Last 24 hours) at 04/19/2018 0932  Last data filed at 04/19/2018 0900  Gross per 24 hour   Intake --   Output 200 ml   Net -200 ml       Meds:     Current Facility-Administered Medications   Medication  Dose Route Frequency   . albuterol  2.5 mg Nebulization Q6H WA   . budesonide  0.25 mg Nebulization BID   . enoxaparin  40 mg Subcutaneous Daily   . levETIRAcetam  500 mg Intravenous Q12H SCH   . lidocaine  1 patch Transdermal Q24H   . melatonin  3 mg Oral QHS   . pantoprazole  40 mg Oral QAM AC   . piperacillin-tazobactam  4.5 g Intravenous Q8H   . vancomycin  1,250 mg Intravenous Q8H       Labs:     Recent Labs   Lab 04/19/18  0703 04/17/18  0444 04/16/18  0341 04/16/18  0007   WBC 12.85* 12.75* 15.31* 28.77*   RBC 4.57 4.58 4.83 5.82   Hgb 12.7 12.4* 13.3 16.0   Hematocrit 37.6 37.4* 38.2 45.2   Glucose 81 87 104* 174*   BUN 5.0* 7.0* 13.0 15.0   Creatinine 0.9 0.8 1.0 1.3   Calcium 8.6 8.3* 8.5 10.2   Sodium 139 141 137 134*  Potassium 4.1 3.7 3.9 4.3   Chloride 105 106 103 97*   CO2 22 23 21* 17*         Radiology:     Radiology Results (24 Hour)     Procedure Component Value Units Date/Time    X-ray chest AP portable [[id number]] Collected:  04/19/18 0818    Order Status:  Completed Updated:  04/19/18 0823    Narrative:       HISTORY: Left chest tube.    COMPARISON: 04/18/2018.    FINDINGS: Left-sided hydropneumothorax is slightly larger, with chest  tube in place. Parenchymal opacity in the left hilum and lower lobe  appear stable. Right lung is clear.      Impression:        Slightly larger left hydropneumothorax with chest tube in  place.    [name], MD   04/19/2018 8:19 AM    X-ray chest AP portable [[id number]] Collected:  04/18/18 1038    Order Status:  Completed Updated:  04/18/18 1043    Narrative:       HISTORY: Left chest tube    TECHNIQUE: AP portable radiograph of the chest was obtained.    COMPARISON: Chest radiograph from 04/17/2018    FINDINGS:      There is redemonstration of a moderate left pneumothorax with a left  chest tube is unchanged. The right lung is clear. The heart size is  normal.        Impression:            Redemonstration of a moderate left pneumothorax with a left  chest  tube which is unchanged when compared to the chest radiograph from  04/17/2018.        Carolyn Stare, MD   04/18/2018 10:39 AM          Imaging personally reviewed, including:         Signed by: Laurel Dimmer, MD  Please tiger text first   Mobile # [phone]  [facility] Critical [place], [place]  [phone]  [phone] [facility] #1)  [phone] [facility] #2)

## 2018-04-19 NOTE — Progress Notes (Signed)
Pt alert and oriented,On RA sats 97%. VSS. Chest tube to continuous suction as per Dr.Srinivas orders. On IV antibiotics and breathing tx. Pt refused bed alarm and fall mats.Encouraged to call for help when in need of assistance. Bed in lowest position,call bell within easy reach,purposeful rounding is being performed. Will continue with plan of care.

## 2018-04-19 NOTE — Progress Notes (Signed)
MEDICINE PROGRESS NOTE  Brendalyn Vallely R. Geoffery Spruce MD    Date Time: 04/19/18 4:58 PM  Patient Name: Craig Phelps  Attending Physician: Arizona Constable, MD    CC: Pneumo    Interval History/24 hour events: Slept well, no cp or sob.    23yo male with h/o seizure disorder, not on AED, who presented after having seizure at home. Pt has been ill for the past 3 weeks with bronchitis and possible GI illness, which pt thinks lowered his seizure threshold. In the ER, pt noted to have L pneumothorax. Chest tube was placed by the ER. Pt now reports feeling improved.   Review of Systems:   Review of Systems - Negative except as in hpi      Physical Exam:     Patient Vitals for the past 24 hrs:   BP Temp Temp src Pulse Resp SpO2   04/19/18 1538 125/69 99.1 F (37.3 C) Oral 96 -- 97 %   04/19/18 0955 -- -- -- -- -- 97 %   04/19/18 0715 128/70 98.4 F (36.9 C) Oral 77 20 98 %   04/18/18 2137 -- -- -- 99 20 97 %   04/18/18 2017 126/79 98.1 F (36.7 C) Oral 99 20 97 %       Intake and Output Summary (Last 24 hours) at Date Time    Intake/Output Summary (Last 24 hours) at 04/19/2018 1658  Last data filed at 04/19/2018 0900  Gross per 24 hour   Intake --   Output 200 ml   Net -200 ml       General: awake, alert, oriented x 3; no acute distress.  Cardiovascular: regular rate and rhythm, no murmurs, rubs or gallops  Lungs: clear to auscultation bilaterally, left CT in place  Abdomen: soft, non-tender, non-distended; no palpable masses, no hepatosplenomegaly, normoactive bowel sounds, no rebound or guarding  Extremities: no clubbing, cyanosis, or edema  Other:     Meds:     Medications were reviewed:    Labs:     Results     Procedure Component Value Units Date/Time    AFB Culture & Smear [[id number]] Collected:  04/19/18 1053    Specimen:  Sputum from Pleural Fluid Updated:  04/19/18 1556    Culture + Gram Stain,Aerobic, Body Fluid [[id number]] Collected:  04/19/18 1053    Specimen:  Body Fluid from Pleural Fluid Updated:  04/19/18 1556    Body  Fluid pH [[id number]] Collected:  04/19/18 1053    Specimen:  Pleural Fluid Updated:  04/19/18 1442     Body Fluid Source: Pleural Fluid     pH, Body Fluid 7.87    Body Fluid Cell Count [[id number]] Collected:  04/19/18 1053    Specimen:  Body Fluid Updated:  04/19/18 1341     Body Fluid Source: Pleural Fluid     Body Fluid WBC 10,820 /cumm      Body Fluid RBC 4,000 /cumm      Body Fluid Polymorphonuclear Cell 3 %      Body Fluid Lymphocytes 8 %      Body Fluid Monocyte/Macrophage Cells 4 %      Body Fluid Eosinophils 85 %     Adenosine Deaminase (ADA), Pleural Fluid [[id number]] Collected:  04/19/18 1053     Updated:  04/19/18 1209    Lactate Dehydrogenase (LDH), Body Fluid [[id number]] Collected:  04/19/18 1053    Specimen:  Body Fluid from Pleural Fluid Updated:  04/19/18 1053    Narrative:  Attention:Body Fluid    Protein, Body Fluid [id number] Collected:  04/19/18 1053    Specimen:  Body Fluid from Pleural Fluid Updated:  04/19/18 1053    Narrative:       Attention:Body Fluid    Glucose, Body Fluid [[id number]] Resulted:  04/19/18 1034    Specimen:  Body Fluid from Pleural Fluid Updated:  04/19/18 1035     Body Fluid Source: Pleural Fluid    Vancomycin, trough [[id number]] Collected:  04/19/18 0703    Specimen:  Blood Updated:  04/19/18 0815     Vancomycin Trough 12.5 ug/mL      Vancomycin Time of Last Dose unk     Vancomycin Date of Last Dose 04/18/2018    GFR [[id number]] Collected:  04/19/18 0703     Updated:  04/19/18 0739     EGFR >60.0    Basic Metabolic Panel [[id number]]  (Abnormal) Collected:  04/19/18 0703    Specimen:  Blood Updated:  04/19/18 0739     Glucose 81 mg/dL      BUN 5.0 mg/dL      Creatinine 0.9 mg/dL      Calcium 8.6 mg/dL      Sodium 841 mEq/L      Potassium 4.1 mEq/L      Chloride 105 mEq/L      CO2 22 mEq/L     CBC without differential [[id number]]  (Abnormal) Collected:  04/19/18 0703    Specimen:  Blood Updated:  04/19/18 0727     WBC 12.85 x10 3/uL      Hgb 12.7 g/dL      Hematocrit  25.3 %      Platelets 438 x10 3/uL      RBC 4.57 x10 6/uL      MCV 82.3 fL      MCH 27.8 pg      MCHC 33.8 g/dL      RDW 13 %      MPV 9.6 fL      Nucleated RBC 0.0 /100 WBC      Absolute NRBC 0.00 x10 3/uL             Imaging personally reviewed, including:       Assessment:     Patient Active Problem List   Diagnosis    Pilonidal cyst    Seizure    Pneumothorax    GERD (gastroesophageal reflux disease)           Plan:   1 Left pneumothorax-spontaneous probably bleb rupture  -CT was placed under water seal yesterday Pneumo size slightly increased restarted on suction by pulm.  -Pleural fluid sent for culture and studies today  -As per pulm cont iv vanco and zosyn-Dr. Annamary Rummage does not think has abcess  2 H/O sz disorder-seen by neuro, EEG and MRI no acute findding cont keppra, no driving for 6 months.   lovenox for dvt spoke to mother on bedside      Disposition:   Today's date: 04/19/2018  Anticipated medical stability for discharge:  Service status:inpt  Reason for ongoing hospitalization: pneumo  Anticipated discharge needs: none      Signed by: Arizona Constable, MD

## 2018-04-20 ENCOUNTER — Inpatient Hospital Stay: Payer: BLUE CROSS/BLUE SHIELD

## 2018-04-20 LAB — VANCOMYCIN, TROUGH: Vancomycin Trough: 11.8 ug/mL (ref 10.0–20.0)

## 2018-04-20 LAB — BODY FLUID PATH REVIEW-

## 2018-04-20 MED ORDER — SODIUM CHLORIDE 0.9 % IV MBP
1000.00 mg | INTRAVENOUS | Status: AC
Start: 2018-04-20 — End: 2018-04-24
  Administered 2018-04-20 – 2018-04-24 (×5): 1000 mg via INTRAVENOUS
  Filled 2018-04-20 (×6): qty 1000

## 2018-04-20 NOTE — Progress Notes (Signed)
PULMONARY MEDICINE PROGRESS NOTE    Date Time: 04/20/18 11:08 AM  Patient Name: [facility] Day: 5     Attending Physician:  Marene Lenz, MD      Assessment:     Patient Active Problem List   Diagnosis   . Pilonidal cyst   . Seizure   . Pneumothorax   . GERD (gastroesophageal reflux disease)       1. Spontaneous PTX  2. Seizure d/o  3. Abscess/vs. Cavitary pneumonia  4. parapneumonic effusion    Recommendation:     Exudative parapneumonic effusion -270 out  Keep tube to suction; do not think it is optimally placed to drain fluid pocket  Would hold off on tpa/dnase given pneumo and appearance of tube on CXR  I have spoken with thoracic surgery for VATS consideration given his young age and otherwise minimal comorbid conditions  Discussed at bedside with Dr. Hollice Espy, patient, his mom      Interval History     Interval History/24 hr. Events: Getting sweats and chills afebrile    Review of Systems:   Review of Systems - reviewed and no change from admission    Physical Exam:     Vitals:    04/20/18 1007   BP: 106/62   Pulse: 75   Resp:    Temp: 98.4 F (36.9 C)   SpO2: 98%     Temp (24hrs), Avg:98.9 F (37.2 C), Min:98.4 F (36.9 C), Max:99.1 F (37.3 C)      General appearance - alert, well appearing, and in no distress, in bed  Mental status - alert, oriented to person, place, and time  Nose - normal and patent, no erythema, discharge or polyps  Neck - supple, no significant adenopathy  Chest - normal air movement, no wheezing, rhonchi or crackles  Tube tidaling well  Heart - normal rate and regular rhythm  Abdomen - soft, nontender, nondistended, no masses or organomegaly  Neurological - alert, oriented, normal speech, no focal findings or movement disorder noted  Extremities - peripheral pulses normal, no pedal edema, no clubbing or cyanosis    Input/Output:     Intake and Output Summary (Last 24 hours) at Date Time    Intake/Output Summary (Last 24 hours) at 04/20/2018 1108  Last data  filed at 04/20/2018 0700  Gross per 24 hour   Intake 450 ml   Output 210 ml   Net 240 ml       Meds:     Current Facility-Administered Medications   Medication Dose Route Frequency   . albuterol  2.5 mg Nebulization Q6H WA   . budesonide  0.25 mg Nebulization BID   . enoxaparin  40 mg Subcutaneous Daily   . levETIRAcetam  500 mg Intravenous Q12H SCH   . lidocaine  1 patch Transdermal Q24H   . melatonin  3 mg Oral QHS   . pantoprazole  40 mg Oral QAM AC   . piperacillin-tazobactam  4.5 g Intravenous Q8H   . vancomycin  1,250 mg Intravenous Q8H       Labs:     Recent Labs   Lab 04/19/18  0703 04/17/18  0444 04/16/18  0341 04/16/18  0007   WBC 12.85* 12.75* 15.31* 28.77*   RBC 4.57 4.58 4.83 5.82   Hgb 12.7 12.4* 13.3 16.0   Hematocrit 37.6 37.4* 38.2 45.2   Glucose 81 87 104* 174*   BUN 5.0* 7.0* 13.0 15.0   Creatinine 0.9 0.8 1.0  1.3   Calcium 8.6 8.3* 8.5 10.2   Sodium 139 141 137 134*   Potassium 4.1 3.7 3.9 4.3   Chloride 105 106 103 97*   CO2 22 23 21* 17*         Radiology:     Radiology Results (24 Hour)     Procedure Component Value Units Date/Time    X-ray chest AP portable [[id number]] Collected:  04/20/18 0933    Order Status:  Completed Updated:  04/20/18 4098    Narrative:       HISTORY: Left chest tube.    COMPARISON: Yesterday    TECHNIQUE: AP portable upright  chest    FINDINGS:    Left chest tube remains in place. Left hydropneumothorax slightly  decreased air and increased fluid. Decreased left lower lung opacity.  Right lung is clear. Cardiac mediastinal silhouette is normal.      Impression:         Left hydropneumothorax slightly decreased air and increased fluid.    Clide Cliff, MD   04/20/2018 9:34 AM          Imaging personally reviewed, including:         Signed by: Laurel Dimmer, MD  Please tiger text first   Mobile # [phone]  [facility] Pulmonary & Critical [place], [place]  [phone]  [phone] [facility] #1)  [phone] [facility] #2)

## 2018-04-20 NOTE — Progress Notes (Addendum)
S- possible interventional procedures for treatment    B- AOX3, Independent, drives, lives w parents for support    A- ongoing chest tube management, may require addtl procedure, room air, likley needs IV anbx upon Arthur per CM consult request placed today    R- expect home self care at [place] w outpt. F/u    Garnet Koyanagi, MSW, LGSW  Social Work Case Manager I   [facility]  Case Management Department  [phone]

## 2018-04-20 NOTE — Plan of Care (Signed)
No significant shift events. Pt is 100% on RA. No tele. Consult today with thoracic team regarding possible surgery or new chest tube placement. CT is connected to continuous suction, but okay to go to water seal when pt is ambulating or off unit for CT.  Instruction given for IS and acapella. Pt to ambulate 20-60 laps around unit per day.  Order placed for midline consult. IV abx continued. Fall mats and bed alarm refused. Call bell within reach. Pt has all belongings.     Problem: Moderate/High Fall Risk Score >5  Goal: Patient will remain free of falls  Outcome: Progressing  Flowsheets (Taken 04/20/2018 1600)  High (Greater than 13): HIGH-Apply yellow "Fall Risk" arm band     Problem: Safety  Goal: Patient will be free from injury during hospitalization  Outcome: Progressing  Flowsheets (Taken 04/20/2018 1604)  Patient will be free from injury during hospitalization : Assess patient's risk for falls and implement fall prevention plan of care per policy; Provide and maintain safe environment; Use appropriate transfer methods; Ensure appropriate safety devices are available at the bedside; Include patient/ family/ care giver in decisions related to safety; Hourly rounding; Assess for patients risk for elopement and implement Elopement Risk Plan per policy  Goal: Patient will be free from infection during hospitalization  Outcome: Progressing  Flowsheets (Taken 04/20/2018 1604)  Free from Infection during hospitalization: Assess and monitor for signs and symptoms of infection; Monitor lab/diagnostic results; Monitor all insertion sites (i.e. indwelling lines, tubes, urinary catheters, and drains); Encourage patient and family to use good hand hygiene technique     Problem: Discharge Barriers  Goal: Patient will be discharged home or other facility with appropriate resources  Outcome: Progressing  Flowsheets (Taken 04/20/2018 1604)  Discharge to home or other facility with appropriate resources: Provide appropriate patient  education; Initiate discharge planning; Provide information on available health resources     Problem: Psychosocial and Spiritual Needs  Goal: Demonstrates ability to cope with hospitalization/illness  Outcome: Progressing  Flowsheets (Taken 04/20/2018 1604)  Demonstrates ability to cope with hospitalizations/illness: Encourage verbalization of feelings/concerns/expectations; Assist patient to identify own strengths and abilities; Provide quiet environment; Encourage patient to set small goals for self; Encourage participation in diversional activity; Include patient/ patient care companion in decisions; Communicate referral to spiritual care as appropriate; Reinforce positive adaptation of new coping behaviors     Problem: Inadequate Gas Exchange  Goal: Adequate oxygenation and improved ventilation  Outcome: Progressing  Flowsheets (Taken 04/20/2018 1604)  Adequate oxygenation and improved ventilation: Assess lung sounds; Monitor SpO2 and treat as needed; Provide mechanical and oxygen support to facilitate gas exchange; Position for maximum ventilatory efficiency; Teach/reinforce use of incentive spirometer 10 times per hour while awake, cough and deep breath as needed; Plan activities to conserve energy: plan rest periods; Increase activity as tolerated/progressive mobility; Consult/collaborate with Respiratory Therapy  Goal: Patent Airway maintained  Outcome: Progressing  Flowsheets (Taken 04/20/2018 1604)  Patent airway maintained : Position patient for maximum ventilatory efficiency; Reinforce use of ordered respiratory interventions (i.e. CPAP, BiPAP, Incentive Spirometer, Acapella, etc.); Provide adequate fluid intake to liquefy secretions; Reposition patient every 2 hours and as needed unless able to self-reposition

## 2018-04-20 NOTE — Consults (Signed)
THORACIC SERVICE ATTENDING NOTE    As the attending physician, I certify that I have seen and examined the patient and I have reviewed the notes, assessments, and radiological studies as outlined and amended. Seen with team afternoon of 3/2, asked for repeat CT and seen again after CT imaging. Discussed with patient and father after CT imaging.    Has been sick since November. Presented last week after seizure (two prior episodes). 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 Has had pigtail placed with good drainage. Small airleak at present. Compilation of imaging reveals resolving lung abscess, worsening basilar fluid collection, and larger PTX with thickening of the visceral pleura suggesting early entrapment.  Given young age and otherwise excellent baseline health status, will likely benefit from operative intervention. Discussed with Dr. Hollice Espy and close family friend, Dr. Sabra Heck.       Madilyn Hook, MD  Thoracic Surgery  [phone]          CONSULT NOTE  Thoracic Surgery  Team [facility]: [phone] / [id number]      Date Time: 04/20/18 11:10 AM  Patient Name: Craig Phelps  Attending Physician: Marene Lenz, MD  Consulting Physician:  Mardelle Matte, MD  Reason for consultation: Left hydropneumothorax      Assessment:   23 y.o. male with PMHx of Seizure disorder who presented to the hospital on 2/27 after having a seizure after URI found to have a left pneumothorax and underwent pigtail placement now with persistent left hydropneumothorax. CT chest on admission consistent with left hydropneumothorax and lung abscess.      Plan:   -Recommend repeat CT chest to assess lung abscess and hydropneumothorax. Pending CT chest may consider proceeding with left VATS, decortications if there is residual undrained fluid collection vs. Repositioning of chest tube with IR to drain remainder of air.   -Aggressive pulmonary toilet in the meantime with ambulation, IS/Acapella, nebs  -Recommend ID  consult for management of IV fluids  -Chest tube to suction but may be placed to WS for ambulation          Lenis Dickinson, [facility]  Thoracic Surgery Physician Assistant  Team [facility] [phone]  Spectra [phone]      History of Present Illness:   Craig Phelps is a 23 y.o. male with history of two prior seizures who presented to the hospital on 2/27 after having a seizure with associated fevers, chills, coughing and nausea found to have a left pneumothorax initially treated with chest tube placement. CT chest obtained in the ED demonstrated left hydropneumothorax with a left lung abscess. He has drained approximately 2L of fluid since the chest tube placement with interval resolution of fevers, nausea, and coughing. However, he continues to have an air leak with persistent small left pneumothorax without associated chest pain or SOB. Thoracic Surgery consulted by Dr. Harland Dingwall for consideration of left decortication given pleural fluid analysis (consistent with parapneumonic effusion).    [name] reports onset of URI symptoms with coughing in November. His symptoms progressively worsened in early February with onset of fevers, chills, and overall malaise. He was evaluated by his PCP and given inhalers with no improvement in his symptoms. Subsequently last week he starting have nausea and vomiting followed by a seizure on Wednesday that prompted his visit to the ED. He reports smoking 1-2 cigarettes per day for 6 years. He also reports remote history of vaping but not in the past year. He denies any sick contacts or  recent travel outside of the country.       Past Medical History:     Past Medical History:   Diagnosis Date    ADHD (attention deficit hyperactivity disorder)     took Adderall in highschool and college but sopped    ENT disease     Pollen allergies/sneezing    Fractures     rt wrist x 2    GERD (gastroesophageal reflux disease)     occ/prilosec OTC    H1N1 influenza 2009    Pilonidal cyst 2014     Seizure        Past Surgical History:     Past Surgical History:   Procedure Laterality Date    CYSTECTOMY, PILONIDAL  08/24/2012    Procedure: CYSTECTOMY, PILONIDAL;  Surgeon: Despina Hidden, MD;  Location: [facility];  Service: General;  Laterality: N/A;    WISDOM TOOTH EXTRACTION  2014       Family History:     Family History   Problem Relation Age of Onset    ADD / ADHD Father     ADD / ADHD Brother     Cancer Paternal Grandfather 73        bladder, smoker    Pulmonary fibrosis Maternal Grandmother         also with ITP    Alzheimer's disease Paternal Grandmother 77       Social History:     Social History     Socioeconomic History    Marital status: Single     Spouse name: Not on file    Number of children: 0    Years of education: Not on file    Highest education level: Not on file   Occupational History    Occupation: Archivist   Social Needs    Financial resource strain: Not on file    Food insecurity:     Worry: Not on file     Inability: Not on file    Transportation needs:     Medical: Not on file     Non-medical: Not on file   Tobacco Use    Smoking status: Former Smoker    Smokeless tobacco: Never Used    Tobacco comment: quit 1 week ago   Substance and Sexual Activity    Alcohol use: Not Currently     Alcohol/week: 0.0 standard drinks    Drug use: No    Sexual activity: Yes     Partners: Female     Comment: monogamous   Lifestyle    Physical activity:     Days per week: Not on file     Minutes per session: Not on file    Stress: Not on file   Relationships    Social connections:     Talks on phone: Not on file     Gets together: Not on file     Attends religious service: Not on file     Active member of club or organization: Not on file     Attends meetings of clubs or organizations: Not on file     Relationship status: Not on file    Intimate partner violence:     Fear of current or ex partner: Not on file     Emotionally abused: Not on file     Physically abused:  Not on file     Forced sexual activity: Not on file   Other Topics Concern    Not  on file   Social History Narrative    Taking a break from school    Wanting to transfer for the fall    Working at [employer]        Exercise: no     Diet: nothing special, tries to avoid GERD inducing foods       Allergies:     Allergies   Allergen Reactions    Other      Environmental allergies       Medications:     Prior to Admission medications    Medication Sig Start Date End Date Taking? Authorizing Provider   albuterol (PROVENTIL HFA;VENTOLIN HFA) 108 (90 Base) MCG/ACT inhaler as needed 04/02/18  Yes [provider]   benzonatate (TESSALON) 100 MG capsule as needed 04/02/18  Yes [provider]   fluticasone (FLONASE) 50 MCG/ACT nasal spray 1 spray by Nasal route daily   Yes [provider]   ondansetron (ZOFRAN-ODT) 4 MG disintegrating tablet Take 1 tablet (4 mg total) by mouth every 8 (eight) hours as needed for Nausea 04/14/18 05/14/18  [place], [place], MD       Review of Systems:   A comprehensive review of systems was: negative except as stated in HPI    Physical Exam:     Vitals:    04/20/18 1007   BP: 106/62   Pulse: 75   Resp:    Temp: 98.4 F (36.9 C)   SpO2: 98%       GEN: WD/WN male, NAD  HEENT: atraumatic, anicteric, neck supple  LUNGS: Rhonchi on left.  Symmetric air entry. Good respiratory effort  COR: RRR, no m/r/g  ABDOMEN: Soft, NT/ND, +BS  EXTREMITIES: warm, no edema    Labs:     Lab Results   Component Value Date    WBC 12.85 (H) 04/19/2018    HGB 12.7 04/19/2018    HCT 37.6 04/19/2018    MCV 82.3 04/19/2018    PLT 438 (H) 04/19/2018     Lab Results   Component Value Date    NA 139 04/19/2018    K 4.1 04/19/2018    CL 105 04/19/2018    CO2 22 04/19/2018    BUN 5.0 (L) 04/19/2018    CREAT 0.9 04/19/2018    GLU 81 04/19/2018    CA 8.6 04/19/2018    MG 2.5 04/16/2018     No results found for: PT, PTT, INR    Rads:   Xr Chest 2 Views    Result Date: 04/16/2018   Large left pneumothorax  with possible tension component. These critical findings were discussed with Dr. Elna Breslow [facility] at 04/16/2018 1:34 AM. Eloise Harman, MD 04/16/2018 1:37 AM    Ct Head Without Contrast    Result Date: 04/16/2018  No acute intracranial abnormality. Eloise Harman, MD 04/16/2018 12:56 AM    Ct Chest Wo Contrast    Result Date: 04/16/2018  1. Small to moderate left-sided hydropneumothorax with cavitary focus along the left major fissure which is felt to represent loculated hydropneumothorax. An intrapulmonary cavitary lesion is felt to be less likely. Continued follow-up recommended. Launa Flight, MD 04/16/2018 1:24 PM    Mri Brain W Wo Contrast    Result Date: 04/17/2018  1.  No intracranial abnormality is detected. 2. The signal pattern and volume of the hippocampal formation is normal bilaterally. 3. There is a normal pattern of enhancement after the administration of contrast material. Theodoro Doing, MD 04/17/2018  1:47 PM    X-ray Chest Ap Portable    Result Date: 04/20/2018  Left hydropneumothorax slightly decreased air and increased fluid. Clide Cliff, MD 04/20/2018 9:34 AM    X-ray Chest Ap Portable    Result Date: 04/19/2018   Slightly larger left hydropneumothorax with chest tube in place. [name], MD 04/19/2018 8:19 AM    X-ray Chest Ap Portable    Result Date: 04/18/2018   Redemonstration of a moderate left pneumothorax with a left chest tube which is unchanged when compared to the chest radiograph from 04/17/2018. Carolyn Stare, MD 04/18/2018 10:39 AM    X-ray Chest Ap Portable    Result Date: 04/17/2018  1. Left pneumothorax increased now moderate. Discussed with patient's nurse. Clide Cliff, MD 04/17/2018 11:39 AM    Xr Chest  Ap Portable    Result Date: 04/16/2018   Interval placement of a left basilar pigtail chest tube with significant interval decrease in size of the left pneumothorax, currently measuring up to 2.5 cm apically. Left midlung and left basilar atelectasis with possible  trace pleural effusion. Eloise Harman, MD 04/16/2018 2:58 AM

## 2018-04-20 NOTE — Consults (Signed)
ID CONSULTATION      Spectra: [phone]    Office: [phone]    Date Time: 04/20/18 12:35 PM  Patient Name: Craig Phelps  Requesting Physician: Marene Lenz, MD     Reason for Consultation:   PNA    Problem List:   Acute Problem List:     PNA  -Chronic cough for ~3 months PTA  -Quit Tobacco ~one week PTA  -GI syndrome ~ one week PTA with N/V/D  -    ID History:   No antibiotic allergies    Chronic Conditions:  Seizure Disorder  GERD  Pilonidal Cyst  Pleural Fluid +WBCs, elevated LDH and Protein; Inflammatory Indicies  Cytospin from Pleural Fluid +Eosinophils suggesting allergic reaction          Assessment:   23YO Man admitted 04/16/18 with Seizure at home (not on any seizure meds despite H/O seizures) abd found to have L PTX with L Chest Tube placement in ED    The presence of the GI syndrome in the week prior to onset of pulmonary process in context of prior vaping (quit~29months PTA) followed by quitting cigarette smoking ~one week PTA suggests the potential for aspiration event with polymicrobial flora causing L-sided PNA with empyema despite negative cultures    Afebrile  WBC~12K  Cr=0.9  L Pleural Fluid Culture from 04/19/18 pending ; NGTD--->AFB negative  Sputum Culture (Induced) from 04/17/18 mixed flora    CXR from 04/20/18 L Hydropneumothorax; R Lung clear    Antimicrobials:   #4 Zosyn 4.5 gm IV Q8H; D/C    #2 Vancomycin 1250 mg IV Q12H  #1 Ertapenem one gm IV Q24H    Recommendations:   MRSA Screen--->if negative, may stop Vancomycin as early as tomorrow    Urinary Ag for Pneumococcus and Legionella    Midline catheter    Plan for multi-week course of Ertapenem targeting mixed flora including anaerobes in setting of N/V PTA    He lives in [place], [place] with Family  D/W all of them in detail at the bedside with many questions addressed    _____________________________________________________________________  I have considered the potential drug interactions between antimicrobial agents   I  have recommended and other medications required by the patient and adjusted appropriately for renal function   I have given thought to the complex medical conditions present and have endeavored to balance the interventions required by the acute conditions with the potential toxicities of the medications and procedures on the patient's well being and on the status of the other chronic conditions  I have engaged considerations and discussions about short and long term prognosis   I have discussed my thoughts with the patient and with relevant medical team members  This case required high complexity decision making and I spent greater than one hour and twenty minutes on his/her case and coordinating his/her care  Thank you for allowing me to participate in the care of this very interesting and pleasant patient    History:   Craig Phelps is a 23 y.o. male who presents to the hospital on 04/16/2018 with L-sided PTX, PNA    Past Medical History:     Past Medical History:   Diagnosis Date    ADHD (attention deficit hyperactivity disorder)     took Adderall in highschool and college but sopped    ENT disease     Pollen allergies/sneezing    Fractures     rt wrist x 2    GERD (  gastroesophageal reflux disease)     occ/prilosec OTC    H1N1 influenza 2009    Pilonidal cyst 2014    Seizure        Past Surgical History:     Past Surgical History:   Procedure Laterality Date    CYSTECTOMY, PILONIDAL  08/24/2012    Procedure: CYSTECTOMY, PILONIDAL;  Surgeon: Despina Hidden, MD;  Location: [facility];  Service: General;  Laterality: N/A;    WISDOM TOOTH EXTRACTION  2014       Family History:     Family History   Problem Relation Age of Onset    ADD / ADHD Father     ADD / ADHD Brother     Cancer Paternal Grandfather 97        bladder, smoker    Pulmonary fibrosis Maternal Grandmother         also with ITP    Alzheimer's disease Paternal Grandmother 23       Social History:     Social History     Socioeconomic  History    Marital status: Single     Spouse name: Not on file    Number of children: 0    Years of education: Not on file    Highest education level: Not on file   Occupational History    Occupation: Archivist   Social Needs    Financial resource strain: Not on file    Food insecurity:     Worry: Not on file     Inability: Not on file    Transportation needs:     Medical: Not on file     Non-medical: Not on file   Tobacco Use    Smoking status: Former Smoker    Smokeless tobacco: Never Used    Tobacco comment: quit 1 week ago   Substance and Sexual Activity    Alcohol use: Not Currently     Alcohol/week: 0.0 standard drinks    Drug use: No    Sexual activity: Yes     Partners: Female     Comment: monogamous   Lifestyle    Physical activity:     Days per week: Not on file     Minutes per session: Not on file    Stress: Not on file   Relationships    Social connections:     Talks on phone: Not on file     Gets together: Not on file     Attends religious service: Not on file     Active member of club or organization: Not on file     Attends meetings of clubs or organizations: Not on file     Relationship status: Not on file    Intimate partner violence:     Fear of current or ex partner: Not on file     Emotionally abused: Not on file     Physically abused: Not on file     Forced sexual activity: Not on file   Other Topics Concern    Not on file   Social History Narrative    Taking a break from school    Wanting to transfer for the fall    Working at [employer]        Exercise: no     Diet: nothing special, tries to avoid GERD inducing foods       Allergies:     Allergies   Allergen Reactions    Other  Environmental allergies       Lines:     Patient Lines/Drains/Airways Status    Active PICC Line / CVC Line / PIV Line / Drain / Airway / Intraosseous Line / Epidural Line / ART Line / Line / Wound / Pressure Ulcer / NG/OG Tube     Name:   Placement date:   Placement time:   Site:   Days:     Peripheral IV 04/19/18 Anterior;Right Upper Arm   04/19/18    2056    Upper Arm   less than 1    Chest Tube Left Midaxillary   04/16/18    0200    Midaxillary   4              *I have performed a risk-benefit analysis and the patient needs a central line for access and IV medications    Medications:     Current Facility-Administered Medications   Medication Dose Route Frequency    albuterol  2.5 mg Nebulization Q6H WA    budesonide  0.25 mg Nebulization BID    enoxaparin  40 mg Subcutaneous Daily    levETIRAcetam  500 mg Intravenous Q12H SCH    lidocaine  1 patch Transdermal Q24H    melatonin  3 mg Oral QHS    pantoprazole  40 mg Oral QAM AC    piperacillin-tazobactam  4.5 g Intravenous Q8H    vancomycin  1,250 mg Intravenous Q8H       Review of Systems:   General ROS: negative for - chills, fevers, night sweats, weight loss   HEENT: negative for - blurry vision, sore throat, thrush   Respiratory ROS: decreased cough, SOB since admission  Cardiovascular ROS: minimal  L-sided chest pain with movement due to Chest Tube  Gastrointestinal ROS: negative for - abdominal pain, nausea, vomiting, diarrhea  Genito-Urinary ROS: negative for - dysuria, urinary frequency/urgency   Musculoskeletal ROS: negative for - joint pain, joint stiffness or muscle pain   Dermatological ROS: negative for - rash and skin lesion changes   Neurological ROS: negative for - confusion, headache, dizziness  Hematological ROS: negative for - bruising, bleeding   Psychological ROS: stable but frustrated mood      Physical Exam:     Vitals:    04/20/18 1007   BP: 106/62   Pulse: 75   Resp:    Temp: 98.4 F (36.9 C)   SpO2: 98%       General Appearance: alert and appropriate, non-toxic; ambulating aggressively in hallway  Neuro: alert, oriented, normal speech, normal attention and cognition   HEENT: no scleral icterus, pupils round and reactive, teeth in good repair  Neck: supple, no significant adenopathy   Lungs: BS decreased L vs.R ;  Chest Tube on L with minimal turbid drainage  Cardiac: RRR  Abdomen: soft, non-tender, non-distended, +BS bowel sounds  Extremities: no pedal edema  Skin: no rash    Labs:     Lab Results   Component Value Date    WBC 12.85 (H) 04/19/2018    HGB 12.7 04/19/2018    HCT 37.6 04/19/2018    MCV 82.3 04/19/2018    PLT 438 (H) 04/19/2018     Lab Results   Component Value Date    CREAT 0.9 04/19/2018     Lab Results   Component Value Date    ALT 11 04/16/2018    AST 14 04/16/2018    ALKPHOS 93 04/16/2018    BILITOTAL 0.9 04/16/2018  No results found for: LACTATE    Microbiology:     Microbiology Results     Procedure Component Value Units Date/Time    AFB Culture & Smear [[id number]] Collected:  04/19/18 1053    Specimen:  Sputum from Pleural Fluid Updated:  04/19/18 1910    Narrative:       ORDER#: [id number]                                    ORDERED BY: Alinda Money  SOURCE: Pleural Fluid left                           COLLECTED:  04/19/18 10:53  ANTIBIOTICS AT COLL.:                                RECEIVED :  04/19/18 15:56  Stain, Acid Fast                           FINAL       04/19/18 19:10  04/19/18   No Acid Fast Bacillus Seen  Culture Acid Fast Bacillus (AFB)           PENDING      CULTURE + Dierdre Forth [[id number]] Collected:  04/17/18 1532    Specimen:  Sputum, Induced Updated:  04/19/18 1829    Narrative:       ORDER#: [id number]                                    ORDERED BY: Timothy Lasso  SOURCE: Sputum, Induced induce sputum                COLLECTED:  04/17/18 15:32  ANTIBIOTICS AT COLL.:                                RECEIVED :  04/17/18 18:14  Stain, Gram (Respiratory)                  FINAL       04/17/18 19:57  04/17/18   Moderate WBC's             Few Squamous epithelial cells             Moderate Mixed Respiratory Flora  Culture and Gram Stain, Aerobic, RespiratorFINAL       04/19/18 18:29  04/19/18   Moderate growth of mixed upper respiratory flora      Culture + Gram  Stain,Aerobic, Body Fluid [[id number]] Collected:  04/19/18 1053    Specimen:  Body Fluid from Pleural Fluid Updated:  04/19/18 1812    Narrative:       ORDER#: [id number]                                    ORDERED BY: Alinda Money  SOURCE: Pleural Fluid left                           COLLECTED:  04/19/18 10:53  ANTIBIOTICS AT COLL.:  RECEIVED :  04/19/18 15:55  Stain, Gram                                FINAL       04/19/18 18:12  04/19/18   Moderate WBCs             No organisms seen  Culture and Gram Stain, Aerobic, Body FluidPENDING            Rads:   X-ray Chest Ap Portable    Result Date: 04/20/2018  Left hydropneumothorax slightly decreased air and increased fluid. Clide Cliff, MD 04/20/2018 9:34 AM      Signed by: Tonita Phoenix., MD

## 2018-04-20 NOTE — Plan of Care (Signed)
Problem: Moderate/High Fall Risk Score >5  Goal: Patient will remain free of falls  Outcome: Progressing  Flowsheets (Taken 04/19/2018 2000)  High (Greater than 13): HIGH-Bed alarm on at all times while patient in bed;HIGH-Apply yellow "Fall Risk" arm band  Note:   Patient alert, oriented x4, MAE x4 with steady gait. Side rails up x2. Call bell within reach and uses effectively. Family at the bedside. Hourly rounding done. On seizure precautions.      Problem: Safety  Goal: Patient will be free from injury during hospitalization  Outcome: Progressing  Flowsheets (Taken 04/20/2018 0223)  Patient will be free from injury during hospitalization : Provide and maintain safe environment; Use appropriate transfer methods; Hourly rounding; Include patient/ family/ care giver in decisions related to safety; Ensure appropriate safety devices are available at the bedside; Assess patient's risk for falls and implement fall prevention plan of care per policy  Note:   Patient alert, oriented x4, MAE x4. On seizure precautions. On scheduled Keppra IV.   Goal: Patient will be free from infection during hospitalization  Outcome: Progressing  Flowsheets (Taken 04/20/2018 0223)  Free from Infection during hospitalization: Assess and monitor for signs and symptoms of infection  Note:   VS WNL. On scheduled IV antibiotics.      Problem: Psychosocial and Spiritual Needs  Goal: Demonstrates ability to cope with hospitalization/illness  Outcome: Progressing  Flowsheets (Taken 04/20/2018 0223)  Demonstrates ability to cope with hospitalizations/illness: Encourage verbalization of feelings/concerns/expectations; Provide quiet environment  Note:   Understand plan of care. Prefers not to be disturb from 10 pm to 7 am.      Problem: Inadequate Gas Exchange  Goal: Adequate oxygenation and improved ventilation  Outcome: Progressing  Flowsheets (Taken 04/16/2018 0656 by Dizon, Kathlene Cote, RN)  Adequate oxygenation and improved ventilation: Assess  lung sounds;Monitor SpO2 and treat as needed;Plan activities to conserve energy: plan rest periods;Increase activity as tolerated/progressive mobility  Note:   On RA., O2 sat 98%. Chest tube in place. PCXR in am.   Goal: Patent Airway maintained  Outcome: Progressing  Flowsheets (Taken 04/20/2018 0223)  Patent airway maintained : Position patient for maximum ventilatory efficiency     Problem: Pain  Goal: Pain at adequate level as identified by patient  Outcome: Completed     Problem: Side Effects from Pain Analgesia  Goal: Patient will experience minimal side effects of analgesic therapy  Outcome: Completed     Problem: Compromised Hemodynamic Status  Goal: Vital signs and fluid balance maintained/improved  Outcome: Completed  Flowsheets (Taken 04/20/2018 0223)  Vital signs and fluid balance are maintained/improved: Monitor/assess vitals and hemodynamic parameters with position changes; Position patient for maximum circulation/cardiac output     Problem: Inadequate Airway Clearance  Goal: Normal respiratory rate/effort achieved/maintained  Outcome: Completed

## 2018-04-21 ENCOUNTER — Inpatient Hospital Stay: Payer: BLUE CROSS/BLUE SHIELD

## 2018-04-21 LAB — CBC
Absolute NRBC: 0 10*3/uL (ref 0.00–0.00)
Hematocrit: 39.3 % (ref 37.6–49.6)
Hgb: 13.3 g/dL (ref 12.5–17.1)
MCH: 27.5 pg (ref 25.1–33.5)
MCHC: 33.8 g/dL (ref 31.5–35.8)
MCV: 81.2 fL (ref 78.0–96.0)
MPV: 9.2 fL (ref 8.9–12.5)
Nucleated RBC: 0 /100 WBC (ref 0.0–0.0)
Platelets: 524 10*3/uL — ABNORMAL HIGH (ref 142–346)
RBC: 4.84 10*6/uL (ref 4.20–5.90)
RDW: 13 % (ref 11–15)
WBC: 11.81 10*3/uL — ABNORMAL HIGH (ref 3.10–9.50)

## 2018-04-21 LAB — PROCALCITONIN: Procalcitonin: 0.05 (ref 0.00–0.10)

## 2018-04-21 LAB — BASIC METABOLIC PANEL
BUN: 6 mg/dL — ABNORMAL LOW (ref 9.0–28.0)
CO2: 24 mEq/L (ref 22–29)
Calcium: 8.6 mg/dL (ref 8.5–10.5)
Chloride: 106 mEq/L (ref 100–111)
Creatinine: 0.8 mg/dL (ref 0.7–1.3)
Glucose: 89 mg/dL (ref 70–100)
Potassium: 3.9 mEq/L (ref 3.5–5.1)
Sodium: 141 mEq/L (ref 136–145)

## 2018-04-21 LAB — GFR: EGFR: >60

## 2018-04-21 LAB — PREALBUMIN: Prealbumin: 9 mg/dL — ABNORMAL LOW (ref 18.0–45.0)

## 2018-04-21 LAB — PRESURGICAL SURVEILLANCE, MSSA+MRSA

## 2018-04-21 LAB — C-REACTIVE PROTEIN: C-Reactive Protein: 16.7 mg/dL — ABNORMAL HIGH (ref 0.0–0.8)

## 2018-04-21 LAB — SEDIMENTATION RATE: Sed Rate: 32 mm/Hr — ABNORMAL HIGH (ref 0–15)

## 2018-04-21 NOTE — Progress Notes (Addendum)
THORACIC SERVICE ATTENDING NOTE    As the attending physician, I certify that I have seen and examined the patient and I have reviewed the notes, assessments, and radiological studies as outlined and amended. Discussed with patient and mother. Will proceed to OR tomorrow. Continue ambulation.    Madilyn Hook, MD  Thoracic Surgery  [phone]        Thoracic Surgery Daily Progress Note  [facility] - Thoracic Surgery   Team Spectra: [phone] / [phone]    Hospital Day: Hospital Day: 6  Post Operative Day: * No surgery date entered *  Primary Procedure: None    Interval History:   NAEON. Ambulated 80 laps yesterday. Doing well this morning. Denies CP or SOB.    Physical Exam:     Vitals:    04/19/18 2030 04/20/18 1007 04/20/18 1557 04/20/18 2008   BP: 126/77 106/62 134/78 118/78   Pulse: 84 75 95 85   Resp: 18   17   Temp: 99.1 F (37.3 C) 98.4 F (36.9 C) 99 F (37.2 C) 99 F (37.2 C)   TempSrc: Oral Oral Oral Oral   SpO2: 98% 98% 100% 95%   Weight:       Height:            PHYSICAL EXAM:  General: AAOx3, NAD  HEENT: Atraumatic, normocephalic, PERRLA  CV: RRR, no murmurs, rubs, gallops  Pulm: CTAB, no wheezes, rhonchi, left CT without air leak    Lines/Drains:   Left pigtail chest tube w/ 190cc sero-purulent output, no air leak    Laboratory and Radiology Results:     Recent Labs   Lab 04/19/18  0703 04/17/18  0444 04/16/18  0341   WBC 12.85* 12.75* 15.31*   RBC 4.57 4.58 4.83   Hgb 12.7 12.4* 13.3   Hematocrit 37.6 37.4* 38.2   Sodium 139 141 137   Potassium 4.1 3.7 3.9   Chloride 105 106 103   CO2 22 23 21*   BUN 5.0* 7.0* 13.0   Creatinine 0.9 0.8 1.0   Calcium 8.6 8.3* 8.5   Magnesium  --   --  2.5   Glucose 81 87 104*                 Ct Chest Wo Contrast    Result Date: 04/20/2018   Persistent right-sided hydropneumothorax despite left chest tube. Persistent area of gas attenuation near the region of the fissure, decreased in size still potentially a cavitary lesion. Rocky Crafts, MD 04/20/2018 1:18 PM    X-ray  Chest Ap Portable    Result Date: 04/20/2018  Left hydropneumothorax slightly decreased air and increased fluid. Clide Cliff, MD 04/20/2018 9:34 AM       Assessment:   Craig Phelps is a 23 y.o. male with left lung abscess and empyema s/p pigtail 2/27 now with a persistent left hydropneumothorax.    Plan:   -- will plan to proceed with left VATS, decortication likely 3/4 afternoon/ evening  -- continue vanc/ ertapenem per ID, patient will need prolonged abx course  -- daily chest x-ray  -- tylenol & PRN tramadol for pain control, please avoid narcotics 2/2 respiratory depressive effects  -- Encourage aggressive pulm toilet with ambulation > 80 laps daily, IS, PEP/PAP therapy, duo nebs  -- No need for chemical DVT prophylaxis due to active ambulation, discussed with patient & family who are in agreement    [name], MD  Thoracic Surgery, R4      **  PLEASE DO NOT PAGE - CALL SPECTRA FIRST IF QUESTIONS **    -------------------------------------------------------------------------------------------------------------------

## 2018-04-21 NOTE — Plan of Care (Signed)
Vital Signs (Abnormalities, treatments, trends): VSS  Weight (Trend if applicable, i.e., diuresing): standing  Abnormal Labs (Orders needed): pt. Requested to be done after 0800  Pain: tylenol Q6. Lidocaine patch  Telemetry: no  Respiratory (Increase or decrease of O2, RA, mode of O2,): RA  If using Bipap or Cpap, any barriers? : Yes or No, time of use? N/A  Ambulatory Sats needed. Yes/No. Completed? N/A  GI/GU (Net output for shift, changes in BM): continentx2  LDA (Difficult access, line removal, output): PIV  Nutrition (Diabetic, FR, NPO): clear liquid until 1100, NPO at 1100  Activity/Safety (PT/OT orders, sitters): standby  Plan/Questions/Orders needed:  -IV Keppra  -Pain mgt  -chest tube to wall suction  -nebs  -Vats surgery

## 2018-04-21 NOTE — Progress Notes (Signed)
PULMONARY MEDICINE PROGRESS NOTE    Date Time: 04/21/18 11:24 AM  Patient Name: [facility] Day: 6     Attending Physician:  Marene Lenz, MD      Assessment:     Patient Active Problem List   Diagnosis   . Pilonidal cyst   . Seizure   . Pneumothorax   . GERD (gastroesophageal reflux disease)       1. Spontaneous PTX  2. Seizure d/o  3. Abscess/vs. Cavitary pneumonia  4. parapneumonic effusion    Recommendation:     Exudative parapneumonic effusion -190 out  Appreciate thoracic surgery input and assistance for VATS 03/04 PM  Keep tube to suction  Ambulate on water seal; suction when at rest  Abx and appreciate ID consultation        Interval History     Interval History/24 hr. Events: Getting sweats and chills afebrile    Review of Systems:   Review of Systems - reviewed and no change from admission    Physical Exam:     Vitals:    04/21/18 0823   BP: 115/74   Pulse: 88   Resp: 21   Temp: 98.1 F (36.7 C)   SpO2: 98%     Temp (24hrs), Avg:98.7 F (37.1 C), Min:98.1 F (36.7 C), Max:99 F (37.2 C)      General appearance - alert, well appearing, and in no distress, in bed  Mental status - alert, oriented to person, place, and time  Nose - normal and patent, no erythema, discharge or polyps  Neck - supple, no significant adenopathy  Chest - normal air movement, no wheezing, rhonchi or crackles  Tube tidaling well  Heart - normal rate and regular rhythm  Abdomen - soft, nontender, nondistended, no masses or organomegaly  Neurological - alert, oriented, normal speech, no focal findings or movement disorder noted  Extremities - peripheral pulses normal, no pedal edema, no clubbing or cyanosis    Input/Output:     Intake and Output Summary (Last 24 hours) at Date Time    Intake/Output Summary (Last 24 hours) at 04/21/2018 1124  Last data filed at 04/21/2018 0641  Gross per 24 hour   Intake --   Output 190 ml   Net -190 ml       Meds:     Current Facility-Administered Medications   Medication Dose  Route Frequency   . albuterol  2.5 mg Nebulization Q6H WA   . budesonide  0.25 mg Nebulization BID   . ertapenem  1,000 mg Intravenous Q24H   . levETIRAcetam  500 mg Intravenous Q12H SCH   . lidocaine  1 patch Transdermal Q24H   . melatonin  3 mg Oral QHS   . pantoprazole  40 mg Oral QAM AC   . vancomycin  1,250 mg Intravenous Q8H       Labs:     Recent Labs   Lab 04/21/18  0748 04/19/18  0703 04/17/18  0444 04/16/18  0341   WBC 11.81* 12.85* 12.75* 15.31*   RBC 4.84 4.57 4.58 4.83   Hgb 13.3 12.7 12.4* 13.3   Hematocrit 39.3 37.6 37.4* 38.2   Glucose 89 81 87 104*   BUN 6.0* 5.0* 7.0* 13.0   Creatinine 0.8 0.9 0.8 1.0   Calcium 8.6 8.6 8.3* 8.5   Sodium 141 139 141 137   Potassium 3.9 4.1 3.7 3.9   Chloride 106 105 106 103   CO2 24 22 23  21*  Radiology:     Radiology Results (24 Hour)     Procedure Component Value Units Date/Time    X-ray chest AP portable [[id number]] Resulted:  04/21/18 0739    Order Status:  Sent Updated:  04/21/18 0925    CT Chest WO Contrast [[id number]] Collected:  04/20/18 1314    Order Status:  Completed Updated:  04/20/18 1334    Narrative:       CT of the chest without intravenous contrast    CLINICAL INFORMATION: Left lung abscess and hydropneumothorax status  post chest tube placement. Persistent pneumothorax.    PROCEDURE: CT of the chest was performed without intravenous contrast.    FINDINGS:    A left chest tube is present with its tip anteriorly. There is a  persistent left-sided hydropneumothorax with evidence of complex fluid  layering dependently. The previously seen somewhat cavitary lesion along  the surface of the left lung is decreased in size and complexity from  the prior examination measuring 1.8 cm in maximal diameter on the  current examination compared with approximately 3.2 cm by my  measurements on the prior examination. This is located near the fissure.  The visualized portions of the upper abdomen are unremarkable appearing.  The right lung is unremarkable.       Impression:        Persistent right-sided hydropneumothorax despite left chest  tube. Persistent area of gas attenuation near the region of the fissure,  decreased in size still potentially a cavitary lesion.    Rocky Crafts, MD   04/20/2018 1:18 PM          Imaging personally reviewed, including:         Signed by: Laurel Dimmer, MD  Please tiger text first   Mobile # [phone]  [facility] [place], [place]  [phone]  [phone] [facility] #1)  [phone] [facility] #2)

## 2018-04-21 NOTE — Progress Notes (Signed)
Met with patient's mother, while pt showered, to discuss home health/home infusion and verify demographics.  Patient lives with his family and so their are teachable people present.  Mother's main concern is that infusion/home health agency are in network with patient's insurance.  Insured her only agencies that accept his insurance will be referred, but could not guarantee that their would be no co-pay.  Referred to [facility] to run benefits.    Betha Loa, RN  Post Acute Care Coordinator  [phone]

## 2018-04-21 NOTE — Plan of Care (Addendum)
Problem: Safety  Goal: Patient will be free from injury during hospitalization  Outcome: Progressing  Flowsheets (Taken 04/21/2018 2200)  Patient will be free from injury during hospitalization : Assess patient's risk for falls and implement fall prevention plan of care per policy; Use appropriate transfer methods; Ensure appropriate safety devices are available at the bedside; Hourly rounding; Include patient/ family/ care giver in decisions related to safety     Problem: Inadequate Gas Exchange  Goal: Adequate oxygenation and improved ventilation  Outcome: Progressing  Flowsheets (Taken 04/21/2018 2200)  Adequate oxygenation and improved ventilation: Assess lung sounds; Monitor SpO2 and treat as needed; Position for maximum ventilatory efficiency; Plan activities to conserve energy: plan rest periods; Increase activity as tolerated/progressive mobility; Consult/collaborate with Respiratory Therapy       Neuro: AOx4  Cardiac: No tele order  Respiratory: Room air, sating upper 90s, 98%  GU/GI:WNL    Diet: Regular diet until 0400, clear liquid diet from 0400 to 0700, NPO starting 0700  Skin: intact  Mobility: Ambulates independently. Encouraged to ambulate a minimum of 75 laps/ day. Completed 100 laps on 3/3.  Has order to ambulate off unit  Access: Peripheral IV clean dry and intact  Fall Score: High fall risk        -Comments/ Plan:  -Pt endorses no pain  -Maintain seizure precaution  -IV Keppra every 12 hours   -VATs decortication procedure planned for 3/4.  -IV antibiotic: Ertapenem every 24 hours  -Neurology consulted   -chest tube: Dressing clean dry and intact. Continuous suction -20. OK to ambulate with chest tube to water seal per orders. Attempted to place chest tube to suction at bedtime, however patient and family member indicated that MD advised them that suction was not necessary and could remain on water seal.  80 ml serous output overnight  -Pt denied shortness of breath, chest pain, dizziness,  palpitation  -Monitoring I/Os  -Hourly rounging, fall precautions are in place, Bed alarm on, Call light and table in reach. Bed locked and at lowest position. Belonging are close by  -Will continue to asess for changes

## 2018-04-21 NOTE — Progress Notes (Signed)
MEDICINE PROGRESS NOTE    Date Time: 04/21/18 12:04 AM  Patient Name: Craig Phelps  Attending Physician: Marene Lenz, MD    Assessment:   23yo male with h/o seizure disorder, not on AED, who presented after having seizure at home. Pt has been ill for the past 3 weeks with bronchitis and possible GI illness, which pt thinks lowered his seizure threshold. In the ER, pt noted to have L pneumothorax. Chest tube was placed by the ER. Pt now reports feeling improved.     # L sided pneumothorax  # L sided hydropneumothorax with cavity focus - concerning for abscess  # seizure - with h/o seizure d/o, no on AEDs  # hyponatremia - related to volume depletion, improving  # vomiting - improving  # leukocytosis - improving  # dehydration    Plan:   - pulmonary following, appreciate recs  - thoracic surgery evaluated pt today for possible surgical intervention. Repeat chest CT showed abscess and complex fluid collection. Discussed with thoracic surgery, will plan on VATS decortication tomorrow afternoon/evening.   - ID consulted, appreciate recs. Abx changed to ertapenem, will likely need prolonged course. Cont vancomycin for now, can likely d/c if MRSA swab negative  - chest tube to suction, can go to water seal for ambulation  - daily CXR  - albuterol nebs, budesonide nebs BID  - neurology consulted for seizure disorder, appreciate recs  - EEG without evidence of seizure  - MRI brain with no acute abnormality  - cont keppra 500mg  BID  - no driving for 6 months, pt aware  - as per thoracic surgery, pt can have regular food until 7am, then clear liquids until 11am. NPO after 11am tomorrow  DVT ppx: lovenox    Case discussed with: pt, pt's family member at bedside, pulm, thoracic surgery    Safety Checklist:     DVT prophylaxis:  CHEST guideline (See page e199S) Chemical   Foley:  [place] Rn Foley protocol Not present   IVs:  Peripheral IV   PT/OT: Not needed   Daily CBC & or Chem ordered:  SHM/ABIM guidelines (see #5)  Yes, due to clinical and lab instability   Reference for approximate charges of common labs: CBC auto diff - $76   BMP - $99   Mg - $79    Lines:     Patient Lines/Drains/Airways Status    Active PICC Line / CVC Line / PIV Line / Drain / Airway / Intraosseous Line / Epidural Line / ART Line / Line / Wound / Pressure Ulcer / NG/OG Tube     Name:   Placement date:   Placement time:   Site:   Days:    Peripheral IV 04/16/18 Right Antecubital   04/16/18    0008    Antecubital   less than 1                 Disposition: (Please see PAF column for Expected D/C Date)   Today's date: 04/21/2018   Admit Date: 04/16/2018 12:05 AM   LOS: 5  Clinical Milestones: seizure, chest tube  Anticipated discharge needs: f/u with neurology      Subjective     CC: Pneumothorax    Interval History/24 hour events: no events    HPI/Subjective: pt reports he is still not feeling very well, has some episodes of feeling warm and then cold. Denies N/V but doesn't have much appetite.    Review of Systems:  As per HPI    Physical Exam:     VITAL SIGNS PHYSICAL EXAM   Temp:  [98.4 F (36.9 C)-99 F (37.2 C)] 99 F (37.2 C)  Heart Rate:  [75-95] 85  Resp Rate:  [17] 17  BP: (106-134)/(62-78) 118/78        Intake/Output Summary (Last 24 hours) at 04/21/2018 0004  Last data filed at 04/20/2018 1621  Gross per 24 hour   Intake --   Output 180 ml   Net -180 ml    Physical Exam  General: awake, alert X 3  Cardiovascular: regular rate and rhythm, no murmurs, rubs or gallops  Lungs: clear to auscultation bilaterally,  Wheezing L lung base, no rhonchi, or rales  Abdomen: soft, non-tender, non-distended; no palpable masses,  normoactive bowel sounds  Extremities: no edema          Meds:     Medications were reviewed:  Current Facility-Administered Medications   Medication Dose Route Frequency    albuterol  2.5 mg Nebulization Q6H WA    budesonide  0.25 mg Nebulization BID    ertapenem  1,000 mg Intravenous Q24H    levETIRAcetam  500 mg Intravenous Q12H SCH     lidocaine  1 patch Transdermal Q24H    melatonin  3 mg Oral QHS    pantoprazole  40 mg Oral QAM AC    vancomycin  1,250 mg Intravenous Q8H     Current Facility-Administered Medications   Medication Dose Route Frequency Last Rate     Current Facility-Administered Medications   Medication Dose Route    acetaminophen  650 mg Oral    benzonatate  100 mg Oral    naloxone  0.2 mg Intravenous    ondansetron  4 mg Oral    Or    ondansetron  4 mg Intravenous         Labs:     Labs (last 72 hours):    Recent Labs   Lab 04/19/18  0703 04/17/18  0444   WBC 12.85* 12.75*   Hgb 12.7 12.4*   Hematocrit 37.6 37.4*   Platelets 438* 386*          Recent Labs   Lab 04/19/18  0703 04/17/18  0444  04/16/18  0007   Sodium 139 141  More results in Results Review 134*   Potassium 4.1 3.7  More results in Results Review 4.3   Chloride 105 106  More results in Results Review 97*   CO2 22 23  More results in Results Review 17*   BUN 5.0* 7.0*  More results in Results Review 15.0   Creatinine 0.9 0.8  More results in Results Review 1.3   Calcium 8.6 8.3*  More results in Results Review 10.2   Albumin  --   --   --  4.2   Protein, Total  --   --   --  8.3   Bilirubin, Total  --   --   --  0.9   Alkaline Phosphatase  --   --   --  93   ALT  --   --   --  11   AST (SGOT)  --   --   --  14   Glucose 81 87  More results in Results Review 174*   More results in Results Review = values in this interval not displayed.  Microbiology, reviewed and are significant for:  Microbiology Results     None          Imaging, reviewed and are significant for:  CT chest:  IMPRESSION:    Persistent right-sided hydropneumothorax despite left chest  tube. Persistent area of gas attenuation near the region of the fissure,  decreased in size still potentially a cavitary lesion.      Signed by: Marene Lenz, MD

## 2018-04-21 NOTE — Progress Notes (Signed)
MEDICINE PROGRESS NOTE    Date Time: 04/21/18 6:01 PM  Patient Name: Craig Phelps  Attending Physician: Marene Lenz, MD    Assessment:   23yo male with h/o seizure disorder, not on AED, who presented after having seizure at home. Pt has been ill for the past 3 weeks with bronchitis and possible GI illness, which pt thinks lowered his seizure threshold. In the ER, pt noted to have L pneumothorax. Chest tube was placed by the ER. Pt now reports feeling improved.     # L sided pneumothorax  # L sided hydropneumothorax with cavity focus - concerning for abscess  # seizure - with h/o seizure d/o, no on AEDs  # hyponatremia - related to volume depletion, improving  # vomiting - improving  # leukocytosis - improving  # dehydration    Plan:   - pulmonary following, appreciate recs  - chest CT showed abscess and complex fluid collection. Discussed with thoracic surgery, will plan on VATS decortication tomorrow afternoon/evening.   - ID consulted, appreciate recs. Abx changed to ertapenem, will likely need prolonged course. Will d/c vancomycin as MRSA swab negative  - chest tube to suction, can go to water seal for ambulation  - daily CXR  - albuterol nebs, budesonide nebs BID  - neurology consulted for seizure disorder, appreciate recs  - EEG without evidence of seizure  - MRI brain with no acute abnormality  - cont keppra 500mg  BID  - no driving for 6 months, pt aware  - as per thoracic surgery, pt can have regular food until 7am, then clear liquids until 11am. NPO after 11am tomorrow  DVT ppx: lovenox    Case discussed with: pt, pt's family member at bedside, thoracic surgery    Safety Checklist:     DVT prophylaxis:  CHEST guideline (See page e199S) Chemical   Foley:  [place] Rn Foley protocol Not present   IVs:  Peripheral IV   PT/OT: Not needed   Daily CBC & or Chem ordered:  SHM/ABIM guidelines (see #5) Yes, due to clinical and lab instability   Reference for approximate charges of common labs: CBC auto diff  - $76   BMP - $99   Mg - $79    Lines:     Patient Lines/Drains/Airways Status    Active PICC Line / CVC Line / PIV Line / Drain / Airway / Intraosseous Line / Epidural Line / ART Line / Line / Wound / Pressure Ulcer / NG/OG Tube     Name:   Placement date:   Placement time:   Site:   Days:    Peripheral IV 04/16/18 Right Antecubital   04/16/18    0008    Antecubital   less than 1                 Disposition: (Please see PAF column for Expected D/C Date)   Today's date: 04/21/2018   Admit Date: 04/16/2018 12:05 AM   LOS: 5  Clinical Milestones: seizure, chest tube  Anticipated discharge needs: f/u with neurology      Subjective     CC: Pneumothorax    Interval History/24 hour events: no events    HPI/Subjective: pt states he is feeling a little better today. Was able to eat some dinner yesterday.    Review of Systems:     As per HPI    Physical Exam:     VITAL SIGNS PHYSICAL EXAM   Temp:  [98.1 F (36.7  C)-99 F (37.2 C)] 98.1 F (36.7 C)  Heart Rate:  [85-98] 98  Resp Rate:  [17-21] 18  BP: (109-118)/(73-78) 109/73        Intake/Output Summary (Last 24 hours) at 04/21/2018 1801  Last data filed at 04/21/2018 0641  Gross per 24 hour   Intake --   Output 50 ml   Net -50 ml    Physical Exam  General: awake, alert X 3  Cardiovascular: regular rate and rhythm, no murmurs, rubs or gallops  Lungs: clear to auscultation bilaterally,  Wheezing L lung base improving, no rhonchi, or rales  Abdomen: soft, non-tender, non-distended; no palpable masses,  normoactive bowel sounds  Extremities: no edema          Meds:     Medications were reviewed:  Current Facility-Administered Medications   Medication Dose Route Frequency    albuterol  2.5 mg Nebulization Q6H WA    budesonide  0.25 mg Nebulization BID    ertapenem  1,000 mg Intravenous Q24H    levETIRAcetam  500 mg Intravenous Q12H SCH    lidocaine  1 patch Transdermal Q24H    melatonin  3 mg Oral QHS    pantoprazole  40 mg Oral QAM AC     Current Facility-Administered  Medications   Medication Dose Route Frequency Last Rate     Current Facility-Administered Medications   Medication Dose Route    acetaminophen  650 mg Oral    benzonatate  100 mg Oral    naloxone  0.2 mg Intravenous    ondansetron  4 mg Oral    Or    ondansetron  4 mg Intravenous         Labs:     Labs (last 72 hours):    Recent Labs   Lab 04/21/18  0748 04/19/18  0703   WBC 11.81* 12.85*   Hgb 13.3 12.7   Hematocrit 39.3 37.6   Platelets 524* 438*          Recent Labs   Lab 04/21/18  0748 04/19/18  0703  04/16/18  0007   Sodium 141 139  More results in Results Review 134*   Potassium 3.9 4.1  More results in Results Review 4.3   Chloride 106 105  More results in Results Review 97*   CO2 24 22  More results in Results Review 17*   BUN 6.0* 5.0*  More results in Results Review 15.0   Creatinine 0.8 0.9  More results in Results Review 1.3   Calcium 8.6 8.6  More results in Results Review 10.2   Albumin  --   --   --  4.2   Protein, Total  --   --   --  8.3   Bilirubin, Total  --   --   --  0.9   Alkaline Phosphatase  --   --   --  93   ALT  --   --   --  11   AST (SGOT)  --   --   --  14   Glucose 89 81  More results in Results Review 174*   More results in Results Review = values in this interval not displayed.                   Microbiology, reviewed and are significant for:  Microbiology Results     None          Imaging, reviewed and are significant for:  CXR:  IMPRESSION:    Left hydropneumothorax with mild increase in air and stable  fluid.      Signed by: Marene Lenz, MD

## 2018-04-21 NOTE — Plan of Care (Signed)
Pt has had no significant shift events.   VSS. RA. No tele.   Chest tube hooked to continuous suction, but okay to go to water seal for ambulation.   Pt ambulating 75 laps per day. Nursing communication order from thoracic team that it is okay for pt to walk off unit.    VATS decortication scheduled for tomorrow. Regular diet until 0700 on 3/4. 0700-1100 clear liquids only.  NPO after 11AM.    Awaiting results from MRSA and MSSA panel.   Pt continues to receive IV abx.   Pt will likely not return to [facility] after tomorrow's procedure.   Fall mats and bed alarm refused.   Call bell within reach.   Pt has all belongings and family at bedside.     Problem: Moderate/High Fall Risk Score >5  Goal: Patient will remain free of falls  Outcome: Progressing  Flowsheets (Taken 04/21/2018 1700)  High (Greater than 13): HIGH-Apply yellow "Fall Risk" arm band     Problem: Safety  Goal: Patient will be free from injury during hospitalization  Outcome: Progressing  Flowsheets (Taken 04/20/2018 1604)  Patient will be free from injury during hospitalization : Assess patient's risk for falls and implement fall prevention plan of care per policy;Provide and maintain safe environment;Use appropriate transfer methods;Ensure appropriate safety devices are available at the bedside;Include patient/ family/ care giver in decisions related to safety;Hourly rounding;Assess for patients risk for elopement and implement Elopement Risk Plan per policy  Goal: Patient will be free from infection during hospitalization  Outcome: Progressing  Flowsheets (Taken 04/20/2018 1604)  Free from Infection during hospitalization: Assess and monitor for signs and symptoms of infection;Monitor lab/diagnostic results;Monitor all insertion sites (i.e. indwelling lines, tubes, urinary catheters, and drains);Encourage patient and family to use good hand hygiene technique     Problem: Discharge Barriers  Goal: Patient will be discharged home or other facility with appropriate  resources  Outcome: Progressing  Flowsheets (Taken 04/20/2018 1604)  Discharge to home or other facility with appropriate resources: Provide appropriate patient education;Initiate discharge planning;Provide information on available health resources     Problem: Psychosocial and Spiritual Needs  Goal: Demonstrates ability to cope with hospitalization/illness  Outcome: Progressing  Flowsheets (Taken 04/20/2018 1604)  Demonstrates ability to cope with hospitalizations/illness: Encourage verbalization of feelings/concerns/expectations;Assist patient to identify own strengths and abilities;Provide quiet environment;Encourage patient to set small goals for self;Encourage participation in diversional activity;Include patient/ patient care companion in decisions;Communicate referral to spiritual care as appropriate;Reinforce positive adaptation of new coping behaviors     Problem: Inadequate Gas Exchange  Goal: Adequate oxygenation and improved ventilation  Outcome: Progressing  Flowsheets (Taken 04/20/2018 1604)  Adequate oxygenation and improved ventilation: Assess lung sounds;Monitor SpO2 and treat as needed;Provide mechanical and oxygen support to facilitate gas exchange;Position for maximum ventilatory efficiency;Teach/reinforce use of incentive spirometer 10 times per hour while awake, cough and deep breath as needed;Plan activities to conserve energy: plan rest periods;Increase activity as tolerated/progressive mobility;Consult/collaborate with Respiratory Therapy  Goal: Patent Airway maintained  Outcome: Progressing  Flowsheets (Taken 04/20/2018 1604)  Patent airway maintained : Position patient for maximum ventilatory efficiency;Reinforce use of ordered respiratory interventions (i.e. CPAP, BiPAP, Incentive Spirometer, Acapella, etc.);Provide adequate fluid intake to liquefy secretions;Reposition patient every 2 hours and as needed unless able to self-reposition

## 2018-04-22 ENCOUNTER — Inpatient Hospital Stay: Payer: BLUE CROSS/BLUE SHIELD

## 2018-04-22 ENCOUNTER — Ambulatory Visit: Payer: Self-pay

## 2018-04-22 ENCOUNTER — Inpatient Hospital Stay: Payer: BLUE CROSS/BLUE SHIELD | Admitting: Student in an Organized Health Care Education/Training Program

## 2018-04-22 ENCOUNTER — Encounter
Admission: EM | Disposition: A | Discharge status: Home Health | Payer: Self-pay | Source: Home / Self Care | Attending: Specialist

## 2018-04-22 DIAGNOSIS — J948 Other specified pleural conditions: Secondary | ICD-10-CM

## 2018-04-22 DIAGNOSIS — B488 Other specified mycoses: Secondary | ICD-10-CM

## 2018-04-22 HISTORY — PX: THORACOSCOPY, (VATS), DECORTICATION: SHX5578

## 2018-04-22 SURGERY — THORACOSCOPY, (VATS), DECORTICATION
Anesthesia: Anesthesia General | Site: Chest | Laterality: Left | Wound class: Dirty or Infected

## 2018-04-22 MED ORDER — ACETAMINOPHEN 10 MG/ML IV SOLN
1000.0000 mg | Freq: Once | INTRAVENOUS | Status: AC
Start: 2018-04-22 — End: 2018-04-22
  Administered 2018-04-22: 21:00:00 1000 mg via INTRAVENOUS
  Filled 2018-04-22: qty 100

## 2018-04-22 MED ORDER — DEXAMETHASONE SODIUM PHOSPHATE 4 MG/ML IJ SOLN (WRAP)
INTRAMUSCULAR | Status: DC | PRN
Start: 2018-04-22 — End: 2018-04-22
  Administered 2018-04-22: 10 mg via INTRAVENOUS

## 2018-04-22 MED ORDER — FENTANYL CITRATE (PF) 50 MCG/ML IJ SOLN (WRAP)
INTRAMUSCULAR | Status: AC
Start: 2018-04-22 — End: ?
  Filled 2018-04-22: qty 2

## 2018-04-22 MED ORDER — ONDANSETRON HCL 4 MG/2ML IJ SOLN
INTRAMUSCULAR | Status: AC
Start: 2018-04-22 — End: ?
  Filled 2018-04-22: qty 2

## 2018-04-22 MED ORDER — GLYCOPYRROLATE 0.2 MG/ML IJ SOLN
INTRAMUSCULAR | Status: AC
Start: 2018-04-22 — End: ?
  Filled 2018-04-22: qty 1

## 2018-04-22 MED ORDER — FAMOTIDINE 10 MG/ML IV SOLN (WRAP)
INTRAVENOUS | Status: DC | PRN
Start: 2018-04-22 — End: 2018-04-22
  Administered 2018-04-22: 20 mg via INTRAVENOUS

## 2018-04-22 MED ORDER — BUPIVACAINE HCL (PF) 0.25 % IJ SOLN
INTRAMUSCULAR | Status: AC
Start: 2018-04-22 — End: 2018-04-22
  Filled 2018-04-22: qty 30

## 2018-04-22 MED ORDER — ROCURONIUM BROMIDE 50 MG/5ML IV SOLN
INTRAVENOUS | Status: AC
Start: 2018-04-22 — End: ?
  Filled 2018-04-22: qty 5

## 2018-04-22 MED ORDER — HYDROMORPHONE HCL 1 MG/ML IJ SOLN
INTRAMUSCULAR | Status: AC
Start: 2018-04-22 — End: 2018-04-22
  Filled 2018-04-22: qty 1

## 2018-04-22 MED ORDER — STERILE WATER FOR IRRIGATION IR SOLN
Status: DC | PRN
Start: 2018-04-22 — End: 2018-04-22
  Administered 2018-04-22: 1500 mL

## 2018-04-22 MED ORDER — BUPIVACAINE HCL 0.25 % IJ SOLN
INTRAMUSCULAR | Status: DC | PRN
Start: 2018-04-22 — End: 2018-04-22
  Administered 2018-04-22: 30 mL

## 2018-04-22 MED ORDER — ROCURONIUM BROMIDE 50 MG/5ML IV SOLN
INTRAVENOUS | Status: DC | PRN
Start: 2018-04-22 — End: 2018-04-22
  Administered 2018-04-22: 50 mg via INTRAVENOUS
  Administered 2018-04-22: 10 mg via INTRAVENOUS
  Administered 2018-04-22: 35 mg via INTRAVENOUS
  Administered 2018-04-22: 15 mg via INTRAVENOUS

## 2018-04-22 MED ORDER — CHLORHEXIDINE GLUCONATE 0.12 % MT SOLN
OROMUCOSAL | Status: AC
Start: 2018-04-22 — End: ?
  Filled 2018-04-22: qty 15

## 2018-04-22 MED ORDER — OXYCODONE-ACETAMINOPHEN 5-325 MG PO TABS
1.0000 | ORAL_TABLET | Freq: Once | ORAL | Status: DC | PRN
Start: 2018-04-22 — End: 2018-04-23

## 2018-04-22 MED ORDER — SUGAMMADEX SODIUM 500 MG/5ML IV SOLN
INTRAVENOUS | Status: AC
Start: 2018-04-22 — End: 2018-04-22
  Filled 2018-04-22: qty 5

## 2018-04-22 MED ORDER — PROPOFOL 10 MG/ML IV EMUL (WRAP)
INTRAVENOUS | Status: AC
Start: 2018-04-22 — End: ?
  Filled 2018-04-22: qty 20

## 2018-04-22 MED ORDER — PHENYLEPHRINE 100 MCG/ML IV SOSY (WRAP)
PREFILLED_SYRINGE | INTRAVENOUS | Status: AC
Start: 2018-04-22 — End: ?
  Filled 2018-04-22: qty 10

## 2018-04-22 MED ORDER — LIDOCAINE HCL (PF) 2 % IJ SOLN
INTRAMUSCULAR | Status: AC
Start: 2018-04-22 — End: ?
  Filled 2018-04-22: qty 5

## 2018-04-22 MED ORDER — AMMONIA AROMATIC IN INHA
1.0000 | Freq: Once | RESPIRATORY_TRACT | Status: DC | PRN
Start: 2018-04-22 — End: 2018-04-23

## 2018-04-22 MED ORDER — PROPOFOL 10 MG/ML IV EMUL (WRAP)
INTRAVENOUS | Status: AC
Start: 2018-04-22 — End: ?
  Filled 2018-04-22: qty 50

## 2018-04-22 MED ORDER — DEXAMETHASONE SODIUM PHOSPHATE 20 MG/5ML IJ SOLN
INTRAMUSCULAR | Status: AC
Start: 2018-04-22 — End: ?
  Filled 2018-04-22: qty 5

## 2018-04-22 MED ORDER — FENTANYL CITRATE (PF) 50 MCG/ML IJ SOLN (WRAP)
25.0000 ug | INTRAMUSCULAR | Status: AC | PRN
Start: 2018-04-22 — End: 2018-04-22
  Administered 2018-04-22: 50 ug via INTRAVENOUS
  Administered 2018-04-22 (×3): 25 ug via INTRAVENOUS
  Administered 2018-04-22: 50 ug via INTRAVENOUS
  Administered 2018-04-22: 25 ug via INTRAVENOUS

## 2018-04-22 MED ORDER — FENTANYL CITRATE (PF) 50 MCG/ML IJ SOLN (WRAP)
INTRAMUSCULAR | Status: AC
Start: 2018-04-22 — End: 2018-04-22
  Filled 2018-04-22: qty 2

## 2018-04-22 MED ORDER — NEOSTIGMINE METHYLSULFATE 1 MG/ML IJ/IV SOLN (WRAP)
Status: AC
Start: 2018-04-22 — End: ?
  Filled 2018-04-22: qty 5

## 2018-04-22 MED ORDER — KETOROLAC TROMETHAMINE 30 MG/ML IJ SOLN
INTRAMUSCULAR | Status: DC | PRN
Start: 2018-04-22 — End: 2018-04-22
  Administered 2018-04-22: 30 mg via INTRAVENOUS

## 2018-04-22 MED ORDER — LUBRIFRESH P.M. OP OINT
TOPICAL_OINTMENT | OPHTHALMIC | Status: AC
Start: 2018-04-22 — End: ?
  Filled 2018-04-22: qty 3.5

## 2018-04-22 MED ORDER — PROPOFOL INFUSION 10 MG/ML
INTRAVENOUS | Status: DC | PRN
Start: 2018-04-22 — End: 2018-04-22
  Administered 2018-04-22: 25 ug/kg/min via INTRAVENOUS

## 2018-04-22 MED ORDER — PHENYLEPHRINE 100 MCG/ML IV SOSY (WRAP)
PREFILLED_SYRINGE | INTRAVENOUS | Status: DC | PRN
Start: 2018-04-22 — End: 2018-04-22
  Administered 2018-04-22 (×3): 100 ug via INTRAVENOUS

## 2018-04-22 MED ORDER — SUGAMMADEX SODIUM 200 MG/2ML IV SOLN
INTRAVENOUS | Status: DC | PRN
Start: 2018-04-22 — End: 2018-04-22
  Administered 2018-04-22: 150 mg via INTRAVENOUS

## 2018-04-22 MED ORDER — LACTATED RINGERS IV SOLN
INTRAVENOUS | Status: DC | PRN
Start: 2018-04-22 — End: 2018-04-22

## 2018-04-22 MED ORDER — HYDROMORPHONE HCL 0.5 MG/0.5 ML IJ SOLN
0.5000 mg | INTRAMUSCULAR | Status: DC | PRN
Start: 2018-04-22 — End: 2018-04-23

## 2018-04-22 MED ORDER — KETOROLAC TROMETHAMINE 60 MG/2ML IM SOLN
INTRAMUSCULAR | Status: AC
Start: 2018-04-22 — End: ?
  Filled 2018-04-22: qty 2

## 2018-04-22 MED ORDER — LIDOCAINE HCL 2 % IJ SOLN
INTRAMUSCULAR | Status: DC | PRN
Start: 2018-04-22 — End: 2018-04-22
  Administered 2018-04-22: 100 mg

## 2018-04-22 MED ORDER — ONDANSETRON HCL 4 MG/2ML IJ SOLN
4.0000 mg | Freq: Once | INTRAMUSCULAR | Status: DC | PRN
Start: 2018-04-22 — End: 2018-04-23

## 2018-04-22 MED ORDER — CHLORHEXIDINE GLUCONATE 0.12 % MT SOLN
15.0000 mL | Freq: Once | OROMUCOSAL | Status: AC
Start: 2018-04-22 — End: 2018-04-22
  Administered 2018-04-22: 19:00:00 15 mL via OROMUCOSAL

## 2018-04-22 MED ORDER — HYDROMORPHONE HCL 1 MG/ML IJ SOLN
INTRAMUSCULAR | Status: AC
Start: 2018-04-22 — End: ?
  Filled 2018-04-22: qty 1

## 2018-04-22 MED ORDER — PROPOFOL INFUSION 10 MG/ML
INTRAVENOUS | Status: DC | PRN
Start: 2018-04-22 — End: 2018-04-22
  Administered 2018-04-22: 200 mg via INTRAVENOUS

## 2018-04-22 MED ORDER — SODIUM CHLORIDE 0.9 % IV BOLUS
500.00 mL | Freq: Once | INTRAVENOUS | Status: AC
Start: 2018-04-22 — End: 2018-04-22
  Administered 2018-04-22: 17:00:00 500 mL via INTRAVENOUS

## 2018-04-22 SURGICAL SUPPLY — 63 items
ADHESIVE SKIN CLOSURE DERMABOND MINI .36 (Suture) ×1 IMPLANT
ADHESIVE SKIN CLOSURE DERMABOND MINI .36 ML LIQUID APPLICATOR (Suture) ×1 IMPLANT
ADHESIVE SKNCLS 2 OCTYL CYNCRLT .36ML MN (Suture) ×2
BLANKET WARMING L60 IN X W36 IN BAIR (Patient Supply) ×1 IMPLANT
BLANKET WRM PLMR BR HGR 60X36IN LF NS FT (Patient Supply) ×2
CATHETER DRN PVC HDRPH STRG TPR HDGLD XL (Catheter Micellaneous) ×2
CATHETER THORAX OD28 FR L23 IN STRAIGHT (Catheter Miscellaneous) ×1 IMPLANT
CATHETER THORAX OD28 FR L23 IN STRAIGHT TAPER DRAINAGE HYDRAGLIDE XL (Catheter Miscellaneous) ×1 IMPLANT
DRAIN CHEST THORACIC TUBE DRY SUCTION (Drain) ×1 IMPLANT
DRAIN CHEST THORACIC TUBE DRY SUCTION COLLECTION CHAMBER ATRIUM OASIS (Drain) ×1 IMPLANT
DRAIN INCS PLS ATR OAS LF STRL TUBE DRY (Drain) ×2
DRAPE 66X44IN SLUSH WARMER PLATE EQUIPMENT ORS HUSH SLUSH STERILE (Drape) ×1 IMPLANT
DRAPE CARDIOVASCULAR SURGICAL SMS 84IN 38IN (Drape) ×1 IMPLANT
DRAPE EQP ORS HSLSH 66X44IN STRL SLSH (Drape) ×2 IMPLANT
DRAPE SRG SMS 84IN 38IN CVARTS 100X60IN (Drape) ×2 IMPLANT
GLOVE SRG PLISPRN 6.5 BGL PI ULTRATOUCH (Glove) ×2
GLOVE SRG PLISPRN 7.5 BGL PI ULTRATOUCH (Glove) ×2
GLOVE SURGICAL 6 1/2 BIOGEL PI (Glove) ×1
GLOVE SURGICAL 6 1/2 BIOGEL PI ULTRATOUCH G POWDER FREE BEAD CUFF (Glove) ×1 IMPLANT
GLOVE SURGICAL 7 1/2 BIOGEL PI (Glove) ×1 IMPLANT
GLOVE SURGICAL 7 1/2 BIOGEL PI ULTRATOUCH G POWDER FREE ROUGH BEAD (Glove) ×1 IMPLANT
GOWN SRG LG SMARTGOWN LF STRL LVL 4 (Gown) ×2
GOWN SRGCL LG LEVEL 4 BRTHBL STRL LF DSPSBL SMARTGOWN (Gown) ×1 IMPLANT
PACK CVOR VATS THORACOSCPY (Pack) ×2 IMPLANT
PACK CVOR VATS THORACOSCPY SCV12CTHF4 (Pack) ×1 IMPLANT
PAD ARMBOARD FOAM 20X8X2IN (Positioning Supplies) ×4
PAD ARMBOARD L20 IN X W8 IN X H2 IN (Positioning Supplies) ×2 IMPLANT
PAD ARMBOARD L20 IN X W8 IN X H2 IN CONVOLUTE FOAM PURPLE (Positioning Supplies) ×2 IMPLANT
POSITIONER OR CONVOLUTE FOAM HEEL (Patient Supply) ×1 IMPLANT
POSITIONER OR FM LF CONVOLUTE HL (Patient Supply) ×2
POSITIONER OR FM LF HI RSLNT CSHN (Positioning Supplies) ×2
POSITIONER OR HIGH RESILIENT CUSHION (Positioning Supplies) ×1
POSITIONER OR HIGH RESILIENT CUSHION MULTIRING FOAM HEAD RASPBERRY (Positioning Supplies) ×1 IMPLANT
POUCH SURGICAL TISUE 5X8 750ML (Laparoscopy Supplies) IMPLANT
SEALER/DIVIDER LAPAROSCOPIC L37 CM 350 D (Laparoscopy Supplies) IMPLANT
SEALER/DIVIDER LAPAROSCOPIC L37 CM 350 D L18.5 MM MARYLAND CURVE JAW (Laparoscopy Supplies) IMPLANT
SEALER/DIVIDER LAPSCP 350D 18.5MM LGSR (Laparoscopy Supplies)
SLEEVE CMPR MED KN LGTH KDL SCD 21- IN (Sleeve) ×2 IMPLANT
SLEEVE COMPRESSION MEDIUM KNEE LENGTH KENDALL SEQUENTIAL OD21- IN (Sleeve) ×1 IMPLANT
SPONGE TNSL CTTN 7/8IN STRL RADOPQ UNSTR (Sponge) ×4
SPONGE TONSIL OD7/8 IN RADIOPAQUE (Sponge) ×2 IMPLANT
SPONGE TONSIL OD7/8 IN RADIOPAQUE UNSTRUNG COTTON (Sponge) ×2 IMPLANT
STAPLER INTNL PVC STD UNV EGIA 12MM (Staplers)
STAPLER TISSUE L16 CM X W4 MM OD12 MM (Staplers) IMPLANT
STAPLER TISSUE L16 CM X W4 MM OD12 MM ENDOSCOPIC ENDO GIA PVC INTERNAL (Staplers) IMPLANT
SUTURE ABS 2 TP-1 VCL 54IN BRD COAT UD (Suture) ×2
SUTURE ABS 3-0 RB1 MNCRL 27IN MFL UD (Suture) ×2
SUTURE COATED VICRYL 2 TP-1 L54 IN BRAID (Suture) ×1 IMPLANT
SUTURE MONOCRYL 3-0 RB-1 L27 IN (Suture) ×1 IMPLANT
SUTURE MONOCRYL 3-0 RB-1 L27 IN MONOFILAMENT UNDYED ABSORBABLE (Suture) ×1 IMPLANT
TAPE SRG CLTH DPRE 10YDX3IN LF HPOAL WTR (Tape) ×2
TAPE SURGICAL L10 YD X W3 IN (Tape) ×1 IMPLANT
TAPE SURGICAL L10 YD X W3 IN HYPOALLERGENIC WATER RESISTANT (Tape) ×1 IMPLANT
TOWEL L26 IN X W17 IN COTTON PREWASH DELINT BLUE ACTISORB DELUXE (Procedure Accessories) ×1 IMPLANT
TOWEL SRG CTTN 26X17IN LF STRL PREWASH (Procedure Accessories) ×2 IMPLANT
TROCAR LAPAROSCOPIC BLADELESS STABILITY SLEEVE L75 MM OD5 MM ENDOPATH (Laparoscopy Supplies) ×1 IMPLANT
TROCAR LAPAROSCOPIC STABILITY SLEEVE (Laparoscopy Supplies) ×1 IMPLANT
TROCAR LAPSCP EPTH XCL 5MM 75MM LF STRL (Laparoscopy Supplies) ×2
WATER STERILE PLASTIC POUR BOTTLE 1000 (Irrigation Solutions) ×1 IMPLANT
WATER STERILE PLASTIC POUR BOTTLE 1000 ML (Irrigation Solutions) ×1 IMPLANT
WATER STERILE PVC FREE DEHP FREE 1000 ML (Solution) ×1 IMPLANT
WATER STRL 1000ML LF PLS PR BTL (Irrigation Solutions) ×2
WATER STRL 1000ML PIC LF PVC FR DEHP-FR (Solution) ×2

## 2018-04-22 NOTE — Transfer of Care (Signed)
Anesthesia Transfer of Care Note    Patient: Craig Phelps    Procedures performed: Procedure(s) with comments:  THORACOSCOPY, (VATS), DECORTICATION - LEFT VATS DECORTICATION    Anesthesia type: General ETT    Patient location:Phase I PACU    Last vitals:   HR 123 ST 98%, 146/78 RR 21  Post pain: Patient not complaining of pain, continue current therapy      Mental Status:awake and alert     Respiratory Function: tolerating room air    Cardiovascular: stable    Nausea/Vomiting: patient not complaining of nausea or vomiting    Hydration Status: adequate    Post assessment: no apparent anesthetic complications, no reportable events and no evidence of recall    Signed by: Leilani Able  04/22/18 10:36 PM

## 2018-04-22 NOTE — Anesthesia Preprocedure Evaluation (Addendum)
Anesthesia Evaluation    AIRWAY    Mallampati: II    TM distance: >3 FB  Neck ROM: full  Mouth Opening:full  Planned to use difficult airway equipment: No CARDIOVASCULAR    cardiovascular exam normal, regular and normal       DENTAL    no notable dental hx     PULMONARY    pulmonary exam normal and clear to auscultation     OTHER FINDINGS                   Craig Phelps is a 23 y.o. male with PMH of seizure d/o (not on any AED) who presents to the hospital with acute seizure. His last seizure was in 2017 and he was diagnosed in 2016 usually induced by anxiety, stress. He has had a chronic cough for the last 3 months, he has a history of e-cigarette use but quit about 6 months ago, still smoking tobacco up until 1 week ago when cough had not resolved. Recent GI illness and left lower chest sharp pain, non radiating, pleuritic in nature. He denies any palpitations. Admitted after a 3 minute tonic-clonic seizure w post-ictal period for about 90 minutes after. Found to have left PTX.    PMH:  Seasonal allergies  PTX (left; pigtail in place)  GERD  Acute GI illness (N/V/D)  Seizure x3 (no AEDs)  ADHD    PSH:  Pilonidal cystectomy  Wisdom teeth    SHx:  E-cigarette use (quit 6mos ago), tobacco use (quit 1.5wks ago)  Alcohol - denies    NKDA    Meds:  Albuterol  Fluticasone  Benzonatate  Zofran    CXR 04/21/18:  IMPRESSION:    Left hydropneumothorax with mild increase in air and stable fluid.    CT Chest 04/20/18:  IMPRESSION:    Persistent right-sided hydropneumothorax despite left chest tube. Persistent area of gas attenuation near the region of the fissure, decreased in size still potentially a cavitary lesion.    Relevant Problems   NEURO/PSYCH   (+) Seizure      GI   (+) GERD (gastroesophageal reflux disease)               Anesthesia Plan    ASA 2     general                     intravenous induction   Detailed anesthesia plan: general endotracheal      Post Op:  Trial extubation is planned.  Post op pain management:  per surgeon    informed consent obtained    Plan discussed with CRNA.    ECG reviewed  pertinent labs reviewed  imaging results reviewed           Signed by: Petra Kuba 04/22/18 9:20 AM    ===============================================================  Inpatient Anesthesia Evaluation    Patient Name: Craig Phelps, Craig Phelps  Surgeon: Madilyn Hook, MD  Patient Age / Sex: 23 y.o. / male    Medical History:     Past Medical History:   Diagnosis Date    ADHD (attention deficit hyperactivity disorder)     took Adderall in highschool and college but sopped    ENT disease     Pollen allergies/sneezing    Fractures     rt wrist x 2    GERD (gastroesophageal reflux disease)     occ/prilosec OTC    H1N1 influenza 2009    Pilonidal cyst 2014  Seizure        Past Surgical History:   Procedure Laterality Date    CYSTECTOMY, PILONIDAL  08/24/2012    Procedure: CYSTECTOMY, PILONIDAL;  Surgeon: Despina Hidden, MD;  Location: [place] [facility];  Service: General;  Laterality: N/A;    WISDOM TOOTH EXTRACTION  2014         Allergies:     Allergies   Allergen Reactions    Other      Environmental allergies         Medications:     Current Facility-Administered Medications   Medication Dose Route Frequency Last Rate Last Dose    [MAR Hold] acetaminophen (TYLENOL) tablet 650 mg  650 mg Oral Q6H PRN   650 mg at 04/18/18 0847    [MAR Hold] albuterol (PROVENTIL) (2.5 MG/3ML) 0.083% nebulizer solution 2.5 mg  2.5 mg Nebulization Q6H WA   2.5 mg at 04/22/18 1347    [MAR Hold] benzonatate (TESSALON) capsule 100 mg  100 mg Oral TID PRN        [MAR Hold] budesonide (PULMICORT) 0.25 MG/2ML nebulizer solution 0.25 mg  0.25 mg Nebulization BID   0.25 mg at 04/22/18 0946    [MAR Hold] ertapenem (INVanz) 1,000 mg in sodium chloride 0.9 % 100 mL IVPB mini-bag plus  1,000 mg Intravenous Q24H 200 mL/hr at 04/22/18 1536 1,000 mg at 04/22/18 1536    [MAR Hold] levETIRAcetam (KEPPRA) IVPB 500 mg 100 mL  500 mg Intravenous Q12H SCH  400 mL/hr at 04/22/18 0944 500 mg at 04/22/18 0944    [MAR Hold] lidocaine (LIDODERM) 5 % 1 patch  1 patch Transdermal Q24H   1 patch at 04/22/18 1000    [MAR Hold] melatonin tablet 3 mg  3 mg Oral QHS   3 mg at 04/21/18 2122    [facility] Hold] naloxone (NARCAN) injection 0.2 mg  0.2 mg Intravenous PRN        [MAR Hold] ondansetron (ZOFRAN-ODT) disintegrating tablet 4 mg  4 mg Oral Q6H PRN        Or    [MAR Hold] ondansetron (ZOFRAN) injection 4 mg  4 mg Intravenous Q6H PRN        [MAR Hold] pantoprazole (PROTONIX) EC tablet 40 mg  40 mg Oral QAM AC   40 mg at 04/21/18 0819              Prior to Admission medications    Medication Sig Start Date End Date Taking? Authorizing Provider   albuterol (PROVENTIL HFA;VENTOLIN HFA) 108 (90 Base) MCG/ACT inhaler as needed 04/02/18  Yes [provider]   benzonatate (TESSALON) 100 MG capsule as needed 04/02/18  Yes [provider]   fluticasone (FLONASE) 50 MCG/ACT nasal spray 1 spray by Nasal route daily   Yes [provider]   ondansetron (ZOFRAN-ODT) 4 MG disintegrating tablet Take 1 tablet (4 mg total) by mouth every 8 (eight) hours as needed for Nausea 04/14/18 05/14/18  T[place], Atitegeb, MD     Vitals   Temp:  [36.5 C (97.7 F)-36.8 C (98.2 F)] 36.5 C (97.7 F)  Heart Rate:  [95-130] 129  Resp Rate:  [17] 17  BP: (118-137)/(69-115) 133/115    Wt Readings from Last 3 Encounters:   04/21/18 69.4 kg (153 lb)   04/16/18 70.3 kg (155 lb)   04/14/18 70 kg (154 lb 6.4 oz)     BMI (Estimated body mass index is 20.75 kg/m as calculated from the following:  Height as of this encounter: 1.829 m (6' 0.01").    Weight as of this encounter: 69.4 kg (153 lb).)  Temp Readings from Last 3 Encounters:   04/22/18 36.5 C (97.7 F) (Oral)   04/14/18 37.3 C (99.1 F)   12/01/16 36.5 C (97.7 F) (Oral)     BP Readings from Last 3 Encounters:   04/22/18 (!) 133/115   04/14/18 145/86   12/01/16 115/58     Pulse Readings from Last 3 Encounters:   04/22/18 (!) 129    04/14/18 (!) 116   12/01/16 90           Labs:   CBC:  Lab Results   Component Value Date    WBC 11.81 (H) 04/21/2018    HGB 13.3 04/21/2018    HCT 39.3 04/21/2018    PLT 524 (H) 04/21/2018       Chemistries:  Lab Results   Component Value Date    NA 141 04/21/2018    K 3.9 04/21/2018    CL 106 04/21/2018    CO2 24 04/21/2018    BUN 6.0 (L) 04/21/2018    CREAT 0.8 04/21/2018    GLU 89 04/21/2018    CA 8.6 04/21/2018    AST 14 04/16/2018       Coags:  No results found for: PT, PTT, INR  _____________________      Signed by: Leilani Able  04/22/18   5:55 PM    =============================================================

## 2018-04-22 NOTE — Progress Notes (Signed)
ID PROGRESS NOTE      Spectra: [phone]    Office: [phone]    Date Time: 04/22/18 12:13 PM  Patient Name: Craig Phelps, Craig Phelps      Updated problem List   Subacute pneumonitis  Pleural effusion  -- pigtail catheter placed  Lung abscess     -- sputum: mixed resp flora   -- pleural fluid: mod WBC, no growth, 10K WBC, pH- 7.87    Chronic Conditions:  H/O tobacco use  H/O vaping 6 months before  Seizure Disorder  GERD  Pilonidal Cyst    Assessment:   Pneumonia with parapneumonic effusion and lung abscess. Improved post chest tube and IV abx.  Now going for VATS today to remove inflammatory material.  Will need a course of several weeks of IV abx, which we can coordinate through our office.    Recommendations:   Continue ertapenem  Will need a midline  Proceed with VATS, as planned.   We will coordinate outpatient IV abx    Antibiotics:   abx #7  Ertapenem 1 gram IV daily #3    IV lines:   piv    Family History:     Family History   Problem Relation Age of Onset    ADD / ADHD Father     ADD / ADHD Brother     Cancer Paternal Grandfather 35        bladder, smoker    Pulmonary fibrosis Maternal Grandmother         also with ITP    Alzheimer's disease Paternal Grandmother 37       Social History:     Social History     Socioeconomic History    Marital status: Single     Spouse name: Not on file    Number of children: 0    Years of education: Not on file    Highest education level: Not on file   Occupational History    Occupation: Archivist   Social Needs    Financial resource strain: Not on file    Food insecurity:     Worry: Not on file     Inability: Not on file    Transportation needs:     Medical: Not on file     Non-medical: Not on file   Tobacco Use    Smoking status: Former Smoker    Smokeless tobacco: Never Used    Tobacco comment: quit 1 week ago   Substance and Sexual Activity    Alcohol use: Not Currently     Alcohol/week: 0.0 standard drinks    Drug use: No    Sexual activity:  Yes     Partners: Female     Comment: monogamous   Lifestyle    Physical activity:     Days per week: Not on file     Minutes per session: Not on file    Stress: Not on file   Relationships    Social connections:     Talks on phone: Not on file     Gets together: Not on file     Attends religious service: Not on file     Active member of club or organization: Not on file     Attends meetings of clubs or organizations: Not on file     Relationship status: Not on file    Intimate partner violence:     Fear of current or ex partner: Not on file  Emotionally abused: Not on file     Physically abused: Not on file     Forced sexual activity: Not on file   Other Topics Concern    Not on file   Social History Narrative    Taking a break from school    Wanting to transfer for the fall    Working at [employer]        Exercise: no     Diet: nothing special, tries to avoid GERD inducing foods       Allergies:     Allergies   Allergen Reactions    Other      Environmental allergies       Review of Systems:   Chest tube in place on left side. Denies dental pain, dysphagia, dyspnea, diarrhea, dyspepsia, dysuria, dermatitis, dystonia, distress    Physical Exam:   BP 119/69    Pulse 97    Temp 97.7 F (36.5 C) (Oral)    Resp 17    Ht 1.829 m (6' 0.01")    Wt 69.4 kg (153 lb)    SpO2 98%    BMI 20.75 kg/m   Awake, alert cooperative, no distress.   HENT normal.   Breathing quietly at rest.   Abdomen soft nontender.   Extremities normal without phlebitis or edema.   No rash.   Neuro exam nonfocal.    Select labs:     Recent Labs   Lab 04/21/18  0748   WBC 11.81*   Hgb 13.3   Hematocrit 39.3   Platelets 524*     Recent Labs   Lab 04/21/18  0748  04/16/18  0007   Sodium 141  More results in Results Review 134*   Potassium 3.9  More results in Results Review 4.3   Chloride 106  More results in Results Review 97*   CO2 24  More results in Results Review 17*   BUN 6.0*  More results in Results Review 15.0   Creatinine 0.8  More  results in Results Review 1.3   Calcium 8.6  More results in Results Review 10.2   Albumin  --   --  4.2   Protein, Total  --   --  8.3   Bilirubin, Total  --   --  0.9   Alkaline Phosphatase  --   --  93   ALT  --   --  11   AST (SGOT)  --   --  14   Glucose 89  More results in Results Review 174*   More results in Results Review = values in this interval not displayed.        Rads:   CXR:  Left pneumothorax decreased air with stable fluid    Attestations:     I have considered the potential drug interactions between antimicrobial agents   I have recommended and other medications required by the patient and adjusted appropriately for renal function.  I have given thought to the complex medical conditions present and have endeavored to balance the interventions required by the acute conditions with the potential toxicities of the medications and procedures on the patient's well being and on the status of the other chronic conditions.  I have engaged considerations and discussions about short and long term prognosis. I have discussed my thoughts with the patient and with relevant medical team members.  A total of  35 minutes were spent in the care of this patient.    Signed by:  Collene Gobble, MD

## 2018-04-22 NOTE — Plan of Care (Signed)
THORACIC SURGERY PLAN OF CARE:    Craig Phelps is 23 y.o. male s/p left VATS, decortication for left empyema    Plan:  Left chest tube to continuous suction, even when ambulating  Must ambulate 20-40 laps tonight  PACU CXR, daily AM CXR  No PACU labs, AM labs  Regular diet, no IVF  Cont broad spectrum antibiotics    [name], MD  Thoracic Surgery, R4      ** PLEASE DO NOT PAGE - CALL SPECTRA FIRST IF QUESTIONS **    -------------------------------------------------------------------------------------------------------------------

## 2018-04-22 NOTE — Progress Notes (Signed)
MEDICINE PROGRESS NOTE    Date Time: 04/22/18 5:11 PM  Patient Name: Craig Phelps  Attending Physician: Marene Lenz, MD    Assessment:   23yo male with h/o seizure disorder, not on AED, who presented after having seizure at home. Pt has been ill for the past 3 weeks with bronchitis and possible GI illness, which pt thinks lowered his seizure threshold. In the ER, pt noted to have L pneumothorax. Chest tube was placed by the ER. Pt now reports feeling improved.     # L sided pneumothorax  # L sided hydropneumothorax with cavity focus - concerning for abscess  # seizure - with h/o seizure d/o, no on AEDs  # hyponatremia - related to volume depletion, improving  # vomiting - improving  # leukocytosis - improving  # dehydration    Plan:   - pulmonary following, appreciate recs  - chest CT showed abscess and complex fluid collection  - plan for VATS decortication this afternoon  - ID consulted, appreciate recs. Abx changed to ertapenem, will likely need prolonged course.   - chest tube management as per thoracic surgery  - daily CXR  - albuterol nebs, budesonide nebs BID  - neurology consulted for seizure disorder, appreciate recs  - EEG without evidence of seizure  - MRI brain with no acute abnormality  - cont keppra 500mg  BID  - no driving for 6 months, pt aware  DVT ppx: lovenox    Case discussed with: pt, pt's family member at bedside, thoracic surgery, pulm    ** Update: This afternoon pt feeling somewhat worse, feels like he is more dehydrated and feeling somewhat faint. I evaluated pt with thoracic surgery. Can give pt NS bolus 500cc, discussed with pt but he would prefer to wait on the IVF. Thoracic surgery APP to discuss with Dr. Dorothey Baseman. Will check FS.    Safety Checklist:     DVT prophylaxis:  CHEST guideline (See page [id number]) Chemical   Foley:  [place] Rn Foley protocol Not present   IVs:  Peripheral IV   PT/OT: Not needed   Daily CBC & or Chem ordered:  SHM/ABIM guidelines (see #5) Yes, due to  clinical and lab instability   Reference for approximate charges of common labs: CBC auto diff - $76   BMP - $99   Mg - $79    Lines:     Patient Lines/Drains/Airways Status    Active PICC Line / CVC Line / PIV Line / Drain / Airway / Intraosseous Line / Epidural Line / ART Line / Line / Wound / Pressure Ulcer / NG/OG Tube     Name:   Placement date:   Placement time:   Site:   Days:    Peripheral IV 04/16/18 Right Antecubital   04/16/18    0008    Antecubital   less than 1                 Disposition: (Please see PAF column for Expected D/C Date)   Today's date: 04/22/2018   Admit Date: 04/16/2018 12:05 AM   LOS: 6  Clinical Milestones: seizure, chest tube  Anticipated discharge needs: f/u with neurology      Subjective     CC: Pneumothorax    Interval History/24 hour events: no events    HPI/Subjective: pt reports he is doing OK, feeling a little dehydrated but otherwise OK. Breathing is OK.    Review of Systems:     As per  HPI    Physical Exam:     VITAL SIGNS PHYSICAL EXAM   Temp:  [97.7 F (36.5 C)-98.2 F (36.8 C)] 97.7 F (36.5 C)  Heart Rate:  [95-97] 97  Resp Rate:  [17] 17  BP: (118-119)/(69-75) 119/69        Intake/Output Summary (Last 24 hours) at 04/22/2018 1711  Last data filed at 04/22/2018 0700  Gross per 24 hour   Intake --   Output 120 ml   Net -120 ml    Physical Exam  General: awake, alert X 3  Cardiovascular: regular rate and rhythm, no murmurs, rubs or gallops  Lungs: clear to auscultation bilaterally,  Wheezing L lung base improving, no rhonchi, or rales  Abdomen: soft, non-tender, non-distended; no palpable masses,  normoactive bowel sounds  Extremities: no edema          Meds:     Medications were reviewed:  Current Facility-Administered Medications   Medication Dose Route Frequency    albuterol  2.5 mg Nebulization Q6H WA    budesonide  0.25 mg Nebulization BID    ertapenem  1,000 mg Intravenous Q24H    levETIRAcetam  500 mg Intravenous Q12H SCH    lidocaine  1 patch Transdermal Q24H     melatonin  3 mg Oral QHS    pantoprazole  40 mg Oral QAM AC    sodium chloride  500 mL Intravenous Once     Current Facility-Administered Medications   Medication Dose Route Frequency Last Rate     Current Facility-Administered Medications   Medication Dose Route    acetaminophen  650 mg Oral    benzonatate  100 mg Oral    naloxone  0.2 mg Intravenous    ondansetron  4 mg Oral    Or    ondansetron  4 mg Intravenous         Labs:     Labs (last 72 hours):    Recent Labs   Lab 04/21/18  0748 04/19/18  0703   WBC 11.81* 12.85*   Hgb 13.3 12.7   Hematocrit 39.3 37.6   Platelets 524* 438*          Recent Labs   Lab 04/21/18  0748 04/19/18  0703  04/16/18  0007   Sodium 141 139  More results in Results Review 134*   Potassium 3.9 4.1  More results in Results Review 4.3   Chloride 106 105  More results in Results Review 97*   CO2 24 22  More results in Results Review 17*   BUN 6.0* 5.0*  More results in Results Review 15.0   Creatinine 0.8 0.9  More results in Results Review 1.3   Calcium 8.6 8.6  More results in Results Review 10.2   Albumin  --   --   --  4.2   Protein, Total  --   --   --  8.3   Bilirubin, Total  --   --   --  0.9   Alkaline Phosphatase  --   --   --  93   ALT  --   --   --  11   AST (SGOT)  --   --   --  14   Glucose 89 81  More results in Results Review 174*   More results in Results Review = values in this interval not displayed.  Microbiology, reviewed and are significant for:  Microbiology Results     None          Imaging, reviewed and are significant for:  CXR:  IMPRESSION:     1. Left pneumothorax decreased air with stable fluid.      Signed by: Marene Lenz, MD

## 2018-04-22 NOTE — Progress Notes (Signed)
Adm to PACU via stretcher in no acute distress, O2 on per mask.  Monitors applied, oriented to room and time.  Report received from CRNA.  O2 simple mask removed and placed on room air.

## 2018-04-22 NOTE — Progress Notes (Addendum)
THORACIC SERVICE ATTENDING NOTE    As the attending physician, I certify that I have seen and examined the patient and I have reviewed the notes, assessments, and radiological studies as outlined and amended.     To OR later today for left VATS drainage and decortication. Discussed finding of lung abscess (improving), empyema, and entrapped lung. Will drain empyema and address entrapped lung. Will leave lung abscess to heal with IV abx. Will likely need prolonged course. ID on board. Indications, risks, benefits and alternatives discussed in detail with patient and family (parents). All questions answered and all members of the team in agreement to proceed.      Madilyn Hook, MD  Thoracic Surgery  [phone]            Thoracic Surgery Daily Progress Note  [facility] - Thoracic Surgery   Team Spectra: [phone] / [phone]    Hospital Day: Hospital Day: 7  Post Operative Day:     Primary Procedure: Procedure(s) (LRB):  THORACOSCOPY, (VATS), DECORTICATION (Left)     Interval History:   NAEON. 100 laps yesterday. Doing well this morning.    Physical Exam:     Vitals:    04/20/18 2008 04/21/18 0823 04/21/18 1700 04/21/18 2027   BP: 118/78 115/74 109/73 118/75   Pulse: 85 88 98 97   Resp: 17 21 18 17    Temp: 99 F (37.2 C) 98.1 F (36.7 C) 98.1 F (36.7 C) 98.2 F (36.8 C)   TempSrc: Oral Oral Oral Oral   SpO2: 95% 98% 99% 98%   Weight:  69.4 kg (153 lb)     Height:            PHYSICAL EXAM:  General: AAOx3, NAD  HEENT: Atraumatic, normocephalic, PERRLA  CV: RRR, no murmurs, rubs, gallops  Pulm: CTAB, no wheezes, rhonchi, left CT without air leak    Lines/Drains:   Left pigtail chest tube: 120 cc sero-purulent output overnight    Laboratory and Radiology Results:     Recent Labs   Lab 04/21/18  0748 04/19/18  0703 04/17/18  0444 04/16/18  0341   WBC 11.81* 12.85* 12.75* 15.31*   RBC 4.84 4.57 4.58 4.83   Hgb 13.3 12.7 12.4* 13.3   Hematocrit 39.3 37.6 37.4* 38.2   Sodium 141 139 141 137   Potassium 3.9 4.1 3.7 3.9    Chloride 106 105 106 103   CO2 24 22 23  21*   BUN 6.0* 5.0* 7.0* 13.0   Creatinine 0.8 0.9 0.8 1.0   Calcium 8.6 8.6 8.3* 8.5   Magnesium  --   --   --  2.5   Glucose 89 81 87 104*                 X-ray Chest Ap Portable    Result Date: 04/21/2018   Left hydropneumothorax with mild increase in air and stable fluid. Mills Koller, MD 04/21/2018 2:06 PM       Assessment:   Craig Phelps is a 23 y.o. male with left lung abscess and empyema s/p pigtail 2/27 now with a persistent left hydropneumothorax.    Plan:   -- will plan to proceed with left VATS, decortication today  -- continue vanc/ ertapenem per ID, patient will need prolonged abx course  -- daily chest x-ray  -- tylenol & PRN tramadol for pain control, please avoid narcotics 2/2 respiratory depressive effects  -- Encourage aggressive pulm toilet with ambulation > 80 laps daily,  IS, PEP/PAP therapy, duo nebs  -- No need for chemical DVT prophylaxis due to active ambulation, discussed with patient & family who are in agreement    [name], MD  Thoracic Surgery, R4      ** PLEASE DO NOT PAGE - CALL SPECTRA FIRST IF QUESTIONS **    -------------------------------------------------------------------------------------------------------------------

## 2018-04-22 NOTE — Progress Notes (Signed)
Back from walk and up in chair.

## 2018-04-22 NOTE — Progress Notes (Signed)
Radiology here to take CXR sitting in chair.

## 2018-04-22 NOTE — Progress Notes (Signed)
PULMONARY MEDICINE PROGRESS NOTE    Date Time: 04/22/18 11:54 AM  Patient Name: [facility] Day: 7     Attending Physician:  Marene Lenz, MD      Assessment:     Patient Active Problem List   Diagnosis   . Pilonidal cyst   . Seizure   . Pneumothorax   . GERD (gastroesophageal reflux disease)       1. Spontaneous PTX  2. Seizure d/o  3. Abscess/vs. Cavitary pneumonia  4. parapneumonic effusion    Recommendation:     -120 out  Patient ambulating constantly, doing great  For VATS later today  On prolonged IV abx  Will continue to follow post op      Interval History     Interval History/24 hr. Events: Walking    Review of Systems:   Review of Systems - reviewed and no change from admission    Physical Exam:     Vitals:    04/22/18 0948   BP:    Pulse: 97   Resp: 17   Temp:    SpO2:      Temp (24hrs), Avg:98 F (36.7 C), Min:97.7 F (36.5 C), Max:98.2 F (36.8 C)      General appearance - alert, well appearing, and in no distress, in bed  Mental status - alert, oriented to person, place, and time  Nose - normal and patent, no erythema, discharge or polyps  Neck - supple, no significant adenopathy  Chest - normal air movement, no wheezing, rhonchi or crackles  Tube tidaling well  Heart - normal rate and regular rhythm  Abdomen - soft, nontender, nondistended, no masses or organomegaly  Neurological - alert, oriented, normal speech, no focal findings or movement disorder noted  Extremities - peripheral pulses normal, no pedal edema, no clubbing or cyanosis    Input/Output:     Intake and Output Summary (Last 24 hours) at Date Time    Intake/Output Summary (Last 24 hours) at 04/22/2018 1154  Last data filed at 04/22/2018 0700  Gross per 24 hour   Intake --   Output 120 ml   Net -120 ml       Meds:     Current Facility-Administered Medications   Medication Dose Route Frequency   . albuterol  2.5 mg Nebulization Q6H WA   . budesonide  0.25 mg Nebulization BID   . ertapenem  1,000 mg Intravenous Q24H    . levETIRAcetam  500 mg Intravenous Q12H SCH   . lidocaine  1 patch Transdermal Q24H   . melatonin  3 mg Oral QHS   . pantoprazole  40 mg Oral QAM AC       Labs:     Recent Labs   Lab 04/21/18  0748 04/19/18  0703 04/17/18  0444 04/16/18  0341   WBC 11.81* 12.85* 12.75* 15.31*   RBC 4.84 4.57 4.58 4.83   Hgb 13.3 12.7 12.4* 13.3   Hematocrit 39.3 37.6 37.4* 38.2   Glucose 89 81 87 104*   BUN 6.0* 5.0* 7.0* 13.0   Creatinine 0.8 0.9 0.8 1.0   Calcium 8.6 8.6 8.3* 8.5   Sodium 141 139 141 137   Potassium 3.9 4.1 3.7 3.9   Chloride 106 105 106 103   CO2 24 22 23  21*         Radiology:     Radiology Results (24 Hour)     Procedure Component Value Units Date/Time  X-ray chest AP portable [[id number]] Collected:  04/22/18 1001    Order Status:  Completed Updated:  04/22/18 1008    Narrative:       HISTORY: Thoracic surgery.    COMPARISON: Yesterday    TECHNIQUE: AP portable upright  chest    FINDINGS:    Left chest tube remains in place. Left hydropneumothorax decreased air  with stable fluid. Persistent left basilar opacity or atelectasis. Right  lung is clear. Normal pulmonary vascularity.  Cardiomediastinal  silhouette  normal.         Impression:         1. Left pneumothorax decreased air with stable fluid.    Clide Cliff, MD   04/22/2018 10:04 AM    X-ray chest AP portable [[id number]] Collected:  04/21/18 1402    Order Status:  Completed Updated:  04/21/18 1410    Narrative:       HISTORY: Left chest tube pneumothorax.    COMPARISON: 04/20/2018.    FINDINGS: AP portable upright chest x-ray:     Stable positioning of the left mid chest pigtail chest tube. Left  hydropneumothorax redemonstrated, with minimal increase in size of the  moderate pleural air and stable small amount of pleural fluid. Right  lung remains clear. Heart size normal. Vascularity normal.          Impression:        Left hydropneumothorax with mild increase in air and stable  fluid.      Mills Koller, MD   04/21/2018 2:06 PM          Imaging  personally reviewed, including:         Signed by: Laurel Dimmer, MD  Please tiger text first   Mobile # [phone]  [facility] [place], [place]  [phone]  [phone] [facility] #1)  [phone] [facility] #2)

## 2018-04-22 NOTE — Progress Notes (Signed)
Up to ambulate around unit.

## 2018-04-22 NOTE — Progress Notes (Signed)
Craig Phelps was admitted to ED post seizure from home.   Patient was endorsing bronchiectasis and GI illness.  Chest tube was placed on L at ED.    Pt was transported to main PACU for VATS procedure this afternoon.    VS: V/S stable  Neuro: A/O x4  Respiratory: Room air; chest tube to water seal with greenish output   Cardiac: not on tele  GI/GU: continent x2  Diet: NPO since midnight last night for VATS procedure  Integumentary: scattered tattoos; otherwise intact   Activity: independent with off-unit privilege   Safety: refused safety protocol  Pain: no c/o pain    Plan:  -IV keppra  -IV Ertapenem  -Thoracic surgery following  -chest tube to water seal   -daily chest x-ray  -neb treatment    Rounding  -family at bedside   -pt ambulated outside of the unit with his mother   -patient stated he was feeling "unwell and thirsty" while he was NPO and waiting for the procedure. Patient stated he was "unsure about going under anesthesia for the procedure." RN called the attending MD and thoracic surgery team.  Patient was given NS bolus starting at 1600 with permission from thoracic surgery.  -Pt refused POCT check.  -this RN escorted pt and family to PACU with transport and gave report onsite.

## 2018-04-23 ENCOUNTER — Encounter: Payer: Self-pay | Admitting: Specialist

## 2018-04-23 ENCOUNTER — Inpatient Hospital Stay: Payer: BLUE CROSS/BLUE SHIELD

## 2018-04-23 LAB — BASIC METABOLIC PANEL
BUN: 10 mg/dL (ref 9.0–28.0)
CO2: 20 mEq/L — ABNORMAL LOW (ref 22–29)
Calcium: 9.2 mg/dL (ref 8.5–10.5)
Chloride: 105 mEq/L (ref 100–111)
Creatinine: 0.8 mg/dL (ref 0.7–1.3)
Glucose: 140 mg/dL — ABNORMAL HIGH (ref 70–100)
Potassium: 4.7 mEq/L (ref 3.5–5.1)
Sodium: 137 mEq/L (ref 136–145)

## 2018-04-23 LAB — CBC
Absolute NRBC: 0 10*3/uL (ref 0.00–0.00)
Hematocrit: 37.7 % (ref 37.6–49.6)
Hgb: 12.5 g/dL (ref 12.5–17.1)
MCH: 27.4 pg (ref 25.1–33.5)
MCHC: 33.2 g/dL (ref 31.5–35.8)
MCV: 82.5 fL (ref 78.0–96.0)
MPV: 9.4 fL (ref 8.9–12.5)
Nucleated RBC: 0 /100 WBC (ref 0.0–0.0)
Platelets: 542 10*3/uL — ABNORMAL HIGH (ref 142–346)
RBC: 4.57 10*6/uL (ref 4.20–5.90)
RDW: 13 % (ref 11–15)
WBC: 12.56 10*3/uL — ABNORMAL HIGH (ref 3.10–9.50)

## 2018-04-23 LAB — PLEURAL FLUID ADENOSINE DEAMINASE: Pleural Fluid Adenosine Deaminase: 8.6 U/L (ref 0.0–9.4)

## 2018-04-23 LAB — GFR: EGFR: >60

## 2018-04-23 LAB — MAGNESIUM: Magnesium: 2.2 mg/dL (ref 1.6–2.6)

## 2018-04-23 NOTE — Progress Notes (Signed)
Up to ambulate to waiting room.  Tolerated well, steady on feet.  Denies dizziness or nausea.  Back to room with mother with patient at 2359.

## 2018-04-23 NOTE — Plan of Care (Signed)
Pt Aox4. Pt has hx of seizures, but reports having auras prior. Seizure precautions in place.   RA-encouraged IS. Diminished lung sounds.  Cont. Tele. NSR. Pt denies c/p, SOB.   Voiding urine freely. Pt denies flatus at this point.   CT to L side of chest. WNL. C/d/I. CT to suction.   Ambulating freely. Pt walking multiple laps.   Mod. Falls risk. 1 floor mat present. Table at bedside. Call bell within reach. WCTM.     Problem: Moderate/High Fall Risk Score >5  Goal: Patient will remain free of falls  Outcome: Progressing  Flowsheets (Taken 04/23/2018 1041)  Moderate Risk (6-13): MOD-Consider activation of bed alarm if appropriate;MOD-Apply bed exit alarm if patient is confused;MOD-Floor mat at bedside (where available) if appropriate;MOD-Consider a move closer to Nurses Station;MOD-Remain with patient during toileting;MOD-Place bedside commode and assistive devices out of sight when not in use     Problem: Safety  Goal: Patient will be free from injury during hospitalization  Outcome: Progressing  Flowsheets (Taken 04/23/2018 1102)  Patient will be free from injury during hospitalization : Assess patient's risk for falls and implement fall prevention plan of care per policy; Ensure appropriate safety devices are available at the bedside; Provide and maintain safe environment; Use appropriate transfer methods; Hourly rounding  Goal: Patient will be free from infection during hospitalization  Outcome: Progressing  Flowsheets (Taken 04/23/2018 1102)  Free from Infection during hospitalization: Assess and monitor for signs and symptoms of infection; Encourage patient and family to use good hand hygiene technique; Monitor all insertion sites (i.e. indwelling lines, tubes, urinary catheters, and drains)     Problem: Discharge Barriers  Goal: Patient will be discharged home or other facility with appropriate resources  Outcome: Progressing  Flowsheets (Taken 04/23/2018 1102)  Discharge to home or other facility with appropriate  resources: Provide appropriate patient education     Problem: Inadequate Gas Exchange  Goal: Adequate oxygenation and improved ventilation  Outcome: Progressing  Flowsheets (Taken 04/23/2018 1102)  Adequate oxygenation and improved ventilation: Teach/reinforce use of incentive spirometer 10 times per hour while awake, cough and deep breath as needed  Goal: Patent Airway maintained  Outcome: Progressing  Flowsheets (Taken 04/23/2018 1102)  Patent airway maintained : Position patient for maximum ventilatory efficiency; Reposition patient every 2 hours and as needed unless able to self-reposition; Provide adequate fluid intake to liquefy secretions     Problem: Inadequate Airway Clearance  Goal: Normal respiratory rate/effort achieved/maintained  Outcome: Progressing  Flowsheets (Taken 04/23/2018 1102)  Normal respiratory rate/effort achieved/maintained: Plan activities to conserve energy: plan rest periods

## 2018-04-23 NOTE — Progress Notes (Signed)
PULMONARY MEDICINE PROGRESS NOTE    Date Time: 04/23/18 12:32 PM  Patient Name: Craig Phelps Day: 8     Attending Physician:  Madilyn Hook, MD      Assessment:     Patient Active Problem List   Diagnosis    Pilonidal cyst    Seizure    Pneumothorax    GERD (gastroesophageal reflux disease)       1. Spontaneous PTX  2. Seizure d/o  3. Abscess/vs. 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 parapneumonic effusion    Recommendation:   Continue abx  Add antifungal coverage; defer to ID on choice    Interval History     Interval History/24 hr. Events: Had VATS yesterday    Review of Systems:   Review of Systems - reviewed and no change from admission    Physical Exam:     Vitals:    04/23/18 0845   BP: 116/55   Pulse: 68   Resp: 18   Temp: (!) 96.9 F (36.1 C)   SpO2: 97%     Temp (24hrs), Avg:97.7 F (36.5 C), Min:96 F (35.6 C), Max:99.6 F (37.6 C)      General appearance - alert, well appearing, and in no distress, in bed  Mental status - alert, oriented to person, place, and time  Nose - normal and patent, no erythema, discharge or polyps  Neck - supple, no significant adenopathy  Chest - normal air movement, no wheezing, rhonchi or crackles  Tube tidaling well  Heart - normal rate and regular rhythm  Abdomen - soft, nontender, nondistended, no masses or organomegaly  Neurological - alert, oriented, normal speech, no focal findings or movement disorder noted  Extremities - peripheral pulses normal, no pedal edema, no clubbing or cyanosis    Input/Output:     Intake and Output Summary (Last 24 hours) at Date Time    Intake/Output Summary (Last 24 hours) at 04/23/2018 1232  Last data filed at 04/23/2018 1120  Gross per 24 hour   Intake 1990 ml   Output 512 ml   Net 1478 ml       Meds:     Current Facility-Administered Medications   Medication Dose Route Frequency    albuterol  2.5 mg Nebulization Q6H WA    budesonide  0.25 mg Nebulization BID    ertapenem   1,000 mg Intravenous Q24H    levETIRAcetam  500 mg Intravenous Q12H SCH    lidocaine  1 patch Transdermal Q24H    melatonin  3 mg Oral QHS    pantoprazole  40 mg Oral QAM AC       Labs:     Recent Labs   Lab 04/23/18  0553 04/21/18  0748 04/19/18  0703 04/17/18  0444   WBC 12.56* 11.81* 12.85* 12.75*   RBC 4.57 4.84 4.57 4.58   Hgb 12.5 13.3 12.7 12.4*   Hematocrit 37.7 39.3 37.6 37.4*   Glucose 140* 89 81 87   BUN 10.0 6.0* 5.0* 7.0*   Creatinine 0.8 0.8 0.9 0.8   Calcium 9.2 8.6 8.6 8.3*   Sodium 137 141 139 141   Potassium 4.7 3.9 4.1 3.7   Chloride 105 106 105 106   CO2 20* 24 22 23          Radiology:     Radiology Results (24 Hour)     Procedure Component Value Units Date/Time    X-ray chest AP  portable [[id number]] Collected:  04/23/18 0913    Order Status:  Completed Updated:  04/23/18 0921    Narrative:       CLINICAL HISTORY: Thoracic surgery.    FINDINGS: Portable semiupright AP view of the chest obtained.  Comparison: 04/22/2018    Left chest tube remains present. Small left pneumothorax, decreased from  prior. Small peripheral airspace opacities in the left mid and lower  lung fields, slightly decreased at the left lung base from prior. Right  lung is clear. Cardiac and mediastinal contours are within normal.      Impression:        Small left pneumothorax, decreased from prior. Airspace  opacities in the left mid and lower lung fields, slightly decreased.    Kennyth Lose, MD   04/23/2018 9:17 AM    XR Chest AP Portable [[id number]] Collected:  04/23/18 0355    Order Status:  Completed Updated:  04/23/18 0400    Narrative:       TECHNIQUE:  AP chest    INDICATION: Thoracic surgery.    COMPARISON: Chest radiograph from earlier on same date.    FINDINGS:     Left chest tube terminating at the left lung apex. Multiple leads  project over the chest.    Small left pneumothorax, decreased. Mild opacity in the mid left lung  and at the left lower lung, unchanged.    No edema, right pneumothorax or pleural  effusions. Normal cardiac size.       Impression:         Small left pneumothorax, decreased.     Mild opacity in the mid left lung and at the left lower lung, unchanged.    Johnsie Kindred, MD   04/23/2018 3:56 AM          Imaging personally reviewed, including:         Signed by: Lucita Ferrara, MD  Please tiger text first   Mobile # [phone]  [facility] [place], [place]  [phone]  [phone] [facility] #1)  [phone] [facility] #2)

## 2018-04-23 NOTE — Progress Notes (Signed)
SURGERY POST OP CHECK  Procedure(s):  THORACOSCOPY, (VATS), DECORTICATION    Subjective:   Pain controlled. No n/v. No dyspnea. No chest pain or SOB. Tolerating regular diet. Has not voided since OR. Has ambulated.     Physical Exam:     Vitals:    04/23/18 0040   BP: 120/75   Pulse: 90   Resp: 20   Temp:    SpO2: 98%     I/O last 3 completed shifts:  In: -   Out: 120 [Chest Tube:120]  EBL: 50 mL    PHYSICAL EXAM:  General:AAOx3, NAD   CV: RRR, no murmurs, no rubs  Pulm: inspiratory/expiratory wheeze to left side, left base diminished w/crackles, LUL and right lung CTA. CT to left chest with 90ml of sanguinous drainage and an airleak 1. No crepitus to left chest tube site.   Abdomen: Soft, nonTTP, nondistended, non tympanic, no rebound or gaurding  Extremities: peripheral pulses intact, no edema   Neuro:  Motor and sensory grossly intact, MAE   Incisions/wounds:  C/D/I, no drainage/erythema      Labs:     Recent Labs   Lab 04/21/18  0748 04/19/18  0703 04/17/18  0444 04/16/18  0341   WBC 11.81* 12.85* 12.75* 15.31*   RBC 4.84 4.57 4.58 4.83   Hgb 13.3 12.7 12.4* 13.3   Hematocrit 39.3 37.6 37.4* 38.2   Glucose 89 81 87 104*   BUN 6.0* 5.0* 7.0* 13.0   Creatinine 0.8 0.9 0.8 1.0   Calcium 8.6 8.6 8.3* 8.5   Sodium 141 139 141 137   Potassium 3.9 4.1 3.7 3.9   Chloride 106 105 106 103   CO2 24 22 23  21*       Assessment:   Craig Phelps is a 23 y.o.  male with Pneumothorax on left [J93.9] POD 0 from a Procedure(s):  THORACOSCOPY, (VATS), DECORTICATION    Plan:   Pain control  Encourage IS and ambulation  Anti-emetics PRN  DTV    Steve Rattler, MD  PGY-1 Surgery

## 2018-04-23 NOTE — Op Note (Signed)
Procedure Date: 04/22/2018     Patient Type: I     SURGEON: Madilyn Hook MD  ASSISTANT:  Daymon Larsen MD     ASSISTANT:  Leona Carry, SA     INDICATIONS FOR OPERATION:  Craig Phelps is a 23 year old gentleman with a prolonged upper respiratory  tract illness, culminating in a left-sided loculated pleural fluid  collection empyema and what appears to be a ruptured lung abscess and a  partially trapped lung.  He is brought to the operating room for operative  drainage and decortication.     PREOPERATIVE DIAGNOSIS:  Lung abscess ruptured into empyema and entrapped lung.     POSTOPERATIVE DIAGNOSIS:  Lung abscess ruptured into empyema and entrapped lung.     TITLE OF PROCEDURE:  Left thoracoscopic complete decortications.     ANESTHESIOLOGIST:  Vita Erm, MD     ANESTHESIA:  Dual-lumen left-sided general endotracheal anesthesia.     ESTIMATED BLOOD LOSS:  50 mL.     FINDINGS:  1.  Multiloculated mucopurulent and gelatinous pleural fluid collections.  2.  Abscess in the fissure.  3.  Entrapped lung successfully decorticated.     SPECIMENS:  Pleural debris for microbiologic evaluation and pathologic evaluation.   Lung abscess wall for microbiologic evaluation.     COMPLICATIONS:  None.     DESCRIPTION OF PROCEDURE:  Craig Phelps was brought to the operating room, placed in supine position,  underwent dual-lumen left-sided general endotracheal anesthesia by the  anesthesia team.  The patient was turned to the right lateral decubitus  position.  The existing pigtail was removed and the patient's left chest  prepped and draped in sterile fashion.  After scrubbing the chest, surgical  pause was performed.  Ongoing antibiotic timings were confirmed.     A sixth intercostal space anterior axillary working port incision was made  approximately 2 cm in length and the chest entered.  There was immediately  identified mucopurulent gelatinous fluid which was evacuated with  non-crushing clamps and a 10 mm suction  catheter.  Overlying the visceral  pleura was a dense mucopurulent rind which was evacuated.  The diaphragm  was tented cephalad, was retracted inferiorly and posteriorly based eighth  intercostal space posterior side, 8-mm camera port incision was made and  the camera shifted posteriorly.  All mucopurulent debris was evacuated from  the chest and chest mobilized from the apex to the base from medial pleural  reflection to lateral pleural reflection of the fissures and the fissure  was exposed.  Upon exposure of the fissure, a large lung abscess it was  noted was left relatively untouched and the area around it decorticated.   The fissure opened proximally and distally.  The abscess appeared to be in  the upper lobe ruptured across the fissure and incorporating the lower  lobe.  There was a dense inflammatory change around the area, but no  drainable fluid collections.  The fissure was opened anterior and posterior  to it generously.  There was a visceral pleural rind overlying the entirety  of the lung.  The lung was successfully decorticated again from apex to  base, medial pleural reflection to lateral pleural reflection of the  fissures were exposed.  Chest irrigated with 2 liters of warm water and  hemostasis confirmed.  Fluid was evacuated posteriorly-based 28-French  chest tube was placed towards the apex and the lung inflated under  thoracoscopic visualization, noted to inflate nicely and fill the space  completely.  Both incisions were closed in standard fashion after  instillation of subcutaneous bupivacaine.  The patient was extubated in the  operating room and taken to the post anesthesia care unit in stable  fashion.  I have apprised the patient's family of our intraoperative  course, findings, and immediate postoperative condition as well as notified  Dr. Elizbeth Squires and Dr. Sabra Heck.           D:  04/22/2018 22:18 PM by Dr. Madilyn Hook, MD ([id number])  T:  04/23/2018 09:06 AM by NTS      Everlean Cherry:  [id number]) (Doc ID: [id number])

## 2018-04-23 NOTE — Anesthesia Postprocedure Evaluation (Signed)
Anesthesia Post Evaluation    Patient: Craig Phelps    Procedure(s) with comments:  THORACOSCOPY, (VATS), DECORTICATION - LEFT VATS DECORTICATION    Anesthesia type: general    Last Vitals:   Vitals Value Taken Time   BP 120/75 04/23/2018 12:40 AM   Temp 37 C (98.6 F) 04/23/2018 12:30 AM   Pulse 90 04/23/2018 12:40 AM   Resp 20 04/23/2018 12:40 AM   SpO2 98 % 04/23/2018 12:40 AM       Anesthesia Post Evaluation:     Patient Evaluated: PACU  Patient Participation: complete - patient participated  Level of Consciousness: awake and alert    Pain Management: adequate    Airway Patency: patent    Anesthetic complications: No      PONV Status: none    Cardiovascular status: acceptable  Respiratory status: acceptable and room air  Hydration status: acceptable        Anesthesia Qualified Clinical Data Registry 2018    PACU Reintubation  Did the Patient have general anesthesia with intubation: Yes  Did the Patient require reintubation in the PACU?: No  Was this a planned exubation trial (documented in the medical record)?: No    PONV Adult  Is the patient aged 24 or older: Yes  Did the patient receive recieve a general anesthestic: Yes  Does the patient have 3 or more risk factors for PONV? Yes  Did the patient receive anti-emetics from at least two classes of medications? Yes      PONV Pediatric  Is the patient aged 29-17? No            PACU Transfer Checklist Protocol  Was the patient transferred to the PACU at the conclusion of surgery? Yes  Was a checklist or transfer protocol used? Yes    ICU Transfer Checklist Protocol  Was the patient transferred to the ICU at the conclusion of surgery? No      Post-op Pain Assessment Prior to Anesthesia Care End  Age >=18 and assessed for pain in PACU: Yes  Pacu pain score <7/10: Yes      Perioperative Mortality  Perioperative mortality prior to Anesthesia end time: No    Perioperative Cardiac Arrest  Did the patient have an unanticipated intraoperative cardiac arrest between  anesthesia start time and anesthesia end time? No    Unplanned Admission to ICU  Did the patient have an unplanned admission to the ICU (not initially anticipated at anesthesia start time)? No      Signed by: Leilani Able, 04/23/2018 8:06 AM

## 2018-04-23 NOTE — UM Notes (Signed)
Inpatient CSR 04/23/2018    3/4: 22 yo male with h/o seizure disorder, not on AED, who presented after having seizure at home. Pt has been ill for the past 3 weeks with bronchitis and possible GI illness, which pt thinks lowered his seizure threshold. In the ER, pt noted to have L pneumothorax. Chest tube was placed by the ER. Pt now reports feeling improved.     Plan:   - pulmonary following, appreciate recs  - chest CT showed abscess and complex fluid collection  - plan for VATS decortication this afternoon  - ID consulted, appreciate recs. Abx changed to ertapenem, will likely need prolonged course.   - chest tube management as per thoracic surgery  - daily CXR  - albuterol nebs, budesonide nebs BID  - neurology consulted for seizure disorder, appreciate recs  - EEG without evidence of seizure  - MRI brain with no acute abnormality  - cont keppra 500mg  BID  - no driving for 6 months, pt aware  DVT ppx: lovenox    Procedure(s):  THORACOSCOPY, (VATS), DECORTICATION    3/5 Thoracic surgery note:  Post Operative Day: 1 Day Post-Op     Interval History:   NAEON. Ambulated 50 laps. Doing well. Pain controlled. Afebrile overnight.    PHYSICAL EXAM:  General: AAOx3, NAD  HEENT: Atraumatic, normocephalic, PERRLA  CV: RRR, no murmurs, rubs, gallops  Pulm: CTAB, no wheezes, rhonchi, left CT with 90 cc SS fluid, +continuous low 1+ air leak    Lines/Drains:   Left chest tube: 90cc SS output, + airleak - continuous 1+    Plan:   -- continue left chest tube to continuous suction, even when ambulating  -- continue vanc/ ertapenem per ID, patient will need prolonged abx course  -- follow up OR cultures and pathology  -- daily morning chest x-ray  -- tylenol & PRN tramadol for pain control  -- Encourage aggressive pulm toilet with ambulation > 80 laps daily, IS, PEP/PAP therapy, duo nebs    3/5 CXR: Small left pneumothorax, decreased. Mild opacity in the mid left lung and at the left lower lung, unchanged.    Labs: Glu 140, WBC  12.56, Plt 542    VS: T96.1, P67, R20, 120/65, 97% 6L      Gillian Scarce  UR Case Production designer, theatre/television/film, MSN, RN  [employer]  [phone] (voicemail)  [email or web address]

## 2018-04-23 NOTE — Plan of Care (Signed)
Patient is AOx4, VSS. Ambulated 50 laps postop. Denied N/V/SOB. Chest tube to the left, dressing CDI; airleak 1; connect to -20cmH2O continue suctioning. Moderate pain to the chest, PRN tylenol given with relief. IS baseline at , able to achieve postop. WCTM.     Problem: Moderate/High Fall Risk Score >5  Goal: Patient will remain free of falls  Outcome: Progressing  Flowsheets (Taken 04/23/2018 0115)  High (Greater than 13): HIGH-Bed alarm on at all times while patient in bed;HIGH-Initiate use of floor mats as appropriate     Problem: Safety  Goal: Patient will be free from injury during hospitalization  Outcome: Progressing  Flowsheets (Taken 04/23/2018 0403)  Patient will be free from injury during hospitalization : Assess patient's risk for falls and implement fall prevention plan of care per policy;Provide and maintain safe environment;Include patient/ family/ care giver in decisions related to safety;Assess for patients risk for elopement and implement Elopement Risk Plan per policy;Ensure appropriate safety devices are available at the bedside;Use appropriate transfer methods;Hourly rounding  Goal: Patient will be free from infection during hospitalization  Outcome: Progressing  Flowsheets (Taken 04/23/2018 0403)  Free from Infection during hospitalization: Assess and monitor for signs and symptoms of infection;Monitor all insertion sites (i.e. indwelling lines, tubes, urinary catheters, and drains);Encourage patient and family to use good hand hygiene technique;Monitor lab/diagnostic results     Problem: Discharge Barriers  Goal: Patient will be discharged home or other facility with appropriate resources  Outcome: Progressing     Problem: Inadequate Gas Exchange  Goal: Adequate oxygenation and improved ventilation  Outcome: Progressing  Flowsheets (Taken 04/23/2018 0403)  Adequate oxygenation and improved ventilation: Assess lung sounds; Monitor SpO2 and treat as needed; Teach/reinforce use of  incentive spirometer 10 times per hour while awake, cough and deep breath as needed; Plan activities to conserve energy: plan rest periods; Increase activity as tolerated/progressive mobility  Goal: Patent Airway maintained  Outcome: Progressing  Flowsheets (Taken 04/23/2018 0403)  Patent airway maintained : Provide adequate fluid intake to liquefy secretions; Reinforce use of ordered respiratory interventions (i.e. CPAP, BiPAP, Incentive Spirometer, Acapella, etc.)     Problem: Inadequate Airway Clearance  Goal: Normal respiratory rate/effort achieved/maintained  Outcome: Progressing  Flowsheets (Taken 04/23/2018 0403)  Normal respiratory rate/effort achieved/maintained: Plan activities to conserve energy: plan rest periods

## 2018-04-23 NOTE — Progress Notes (Signed)
Critical lab: biopsy +fungal organism septate Hyphae. Notified thoracic and ID team. Wctm.

## 2018-04-23 NOTE — Progress Notes (Signed)
UP to ambulate to room.  Walked long way around to room and long way round in CCW to room.  Tolerated well.

## 2018-04-23 NOTE — Progress Notes (Signed)
Chest tube with continuous wall suction.   Noted continuous bubbles with breathing and talking, but no bubbles with holding breath or pinched the chest tube.   Notified Amil Amen, [place] and it is okay.   Pt denied any respiratory distress.   Continue to monitor.

## 2018-04-23 NOTE — Progress Notes (Signed)
Thoracic Surgery Daily Progress Note  [facility] - Thoracic Surgery   Team Spectra: [phone] / [phone]    Hospital Day: Hospital Day: 8  Post Operative Day: 1 Day Post-Op   Primary Procedure: Procedure(s) (LRB):  THORACOSCOPY, (VATS), DECORTICATION (Left)     Interval History:   NAEON. Ambulated 50 laps. Doing well. Pain controlled. Afebrile overnight.      Physical Exam:     Vitals:    04/23/18 0020 04/23/18 0030 04/23/18 0040 04/23/18 0051   BP: 126/75 115/72 120/75 139/67   Pulse: 94 91 90 85   Resp: 16 18 20 20    Temp:  98.6 F (37 C)  (!) 96 F (35.6 C)   TempSrc:  Temporal  Oral   SpO2: 98% 98% 98% 97%   Weight:       Height:            PHYSICAL EXAM:  General: AAOx3, NAD  HEENT: Atraumatic, normocephalic, PERRLA  CV: RRR, no murmurs, rubs, gallops  Pulm: CTAB, no wheezes, rhonchi, left CT with 90 cc SS fluid, +continuous low 1+ air leak    Lines/Drains:   Left chest tube: 90cc SS output, + airleak - continuous 1+    Laboratory and Radiology Results:     Recent Labs   Lab 04/21/18  0748 04/19/18  0703 04/17/18  0444   WBC 11.81* 12.85* 12.75*   RBC 4.84 4.57 4.58   Hgb 13.3 12.7 12.4*   Hematocrit 39.3 37.6 37.4*   Sodium 141 139 141   Potassium 3.9 4.1 3.7   Chloride 106 105 106   CO2 24 22 23    BUN 6.0* 5.0* 7.0*   Creatinine 0.8 0.9 0.8   Calcium 8.6 8.6 8.3*   Glucose 89 81 87                 Xr Chest Ap Portable    Result Date: 04/23/2018  Small left pneumothorax, decreased. Mild opacity in the mid left lung and at the left lower lung, unchanged. Johnsie Kindred, MD 04/23/2018 3:56 AM    X-ray Chest Ap Portable    Result Date: 04/22/2018  1. Left pneumothorax decreased air with stable fluid. Clide Cliff, MD 04/22/2018 10:04 AM       Assessment:   Craig Phelps is a 23 y.o. male with left lung abscess and empyema s/p pigtail 2/27 with a persistent left hydropneumothorax now 1 Day Post-Op s/p left VATS, decortication. Doing well post-operatively.    Plan:   -- continue left chest tube to continuous suction, even when  ambulating  -- continue vanc/ ertapenem per ID, patient will need prolonged abx course  -- follow up OR cultures and pathology  -- daily morning chest x-ray  -- tylenol & PRN tramadol for pain control  -- Encourage aggressive pulm toilet with ambulation > 80 laps daily, IS, PEP/PAP therapy, duo nebs    [name], MD  Thoracic Surgery, R4      ** PLEASE DO NOT PAGE - CALL SPECTRA FIRST IF QUESTIONS **    -------------------------------------------------------------------------------------------------------------------

## 2018-04-24 ENCOUNTER — Inpatient Hospital Stay: Payer: BLUE CROSS/BLUE SHIELD

## 2018-04-24 DIAGNOSIS — J869 Pyothorax without fistula: Secondary | ICD-10-CM

## 2018-04-24 DIAGNOSIS — J852 Abscess of lung without pneumonia: Secondary | ICD-10-CM

## 2018-04-24 MED ORDER — VORICONAZOLE 200 MG PO TABS
400.00 mg | ORAL_TABLET | Freq: Two times a day (BID) | ORAL | Status: AC
Start: 2018-04-24 — End: 2018-04-24
  Administered 2018-04-24 (×2): 400 mg via ORAL
  Filled 2018-04-24 (×2): qty 2

## 2018-04-24 MED ORDER — TRAMADOL HCL 50 MG PO TABS
50.0000 mg | ORAL_TABLET | Freq: Four times a day (QID) | ORAL | Status: DC | PRN
Start: 2018-04-24 — End: 2018-05-05
  Administered 2018-04-24 – 2018-05-01 (×4): 50 mg via ORAL
  Filled 2018-04-24 (×5): qty 1

## 2018-04-24 MED ORDER — VORICONAZOLE 200 MG PO TABS
200.0000 mg | ORAL_TABLET | Freq: Two times a day (BID) | ORAL | Status: DC
Start: 2018-04-25 — End: 2018-05-05
  Administered 2018-04-25 – 2018-05-05 (×21): 200 mg via ORAL
  Filled 2018-04-24 (×27): qty 1

## 2018-04-24 NOTE — Progress Notes (Signed)
Thoracic Surgery Daily Progress Note  [facility] - Thoracic Surgery   Team Spectra: [phone] / [phone]    Hospital Day: Hospital Day: 9  Post Operative Day: 2 Days Post-Op   Primary Procedure: Procedure(s) (LRB):  THORACOSCOPY, (VATS), DECORTICATION (Left)     Interval History:   NAEON. Ambulated 125 laps. Doing well. Pain controlled. Afebrile overnight.    Physical Exam:     Vitals:    04/23/18 1929 04/23/18 2300 04/24/18 0000 04/24/18 0537   BP: 122/62 117/80  116/64   Pulse: (!) 104 (!) 112 (!) 104 90   Resp: 17 18  17    Temp: 98.4 F (36.9 C) (!) 96.8 F (36 C) 97.2 F (36.2 C) (!) 96.9 F (36.1 C)   TempSrc: Oral Oral  Oral   SpO2: 100% 98%  97%   Weight:       Height:            PHYSICAL EXAM:  General: AAOx3, NAD  HEENT: Atraumatic, normocephalic, PERRLA  CV: RRR, no murmurs, rubs, gallops  Pulm: CTAB, no wheezes, rhonchi, left CT with 90 cc SS fluid, +continuous 1+ air leak    Lines/Drains:   Left chest tube: 230cc SS output, + airleak continuous 1+    Laboratory and Radiology Results:     Recent Labs   Lab 04/23/18  0553 04/21/18  0748 04/19/18  0703   WBC 12.56* 11.81* 12.85*   RBC 4.57 4.84 4.57   Hgb 12.5 13.3 12.7   Hematocrit 37.7 39.3 37.6   Sodium 137 141 139   Potassium 4.7 3.9 4.1   Chloride 105 106 105   CO2 20* 24 22   BUN 10.0 6.0* 5.0*   Creatinine 0.8 0.8 0.9   Calcium 9.2 8.6 8.6   Magnesium 2.2  --   --    Glucose 140* 89 81                 No results found.     Assessment:   Craig Phelps is a 23 y.o. male with left lung abscess and empyema s/p pigtail 2/27 with a persistent left hydropneumothorax now 2 Days Post-Op s/p left VATS, decortication. Doing well post-operatively.  -- per report, +fungal culture    Plan:   -- continue left chest tube to continuous suction - , even when ambulating  -- continue vanc/ ertapenem per ID, patient will need prolonged abx course, +fungal cultures - defer to ID for anti-fungal coverage  -- follow up OR cultures and pathology  -- daily morning chest  x-ray  -- tylenol & PRN tramadol for pain control  -- Encourage aggressive pulm toilet with ambulation > 80 laps daily, IS, PEP/PAP therapy, duo nebs    [name], MD  Thoracic Surgery, R4      ** PLEASE DO NOT PAGE - CALL SPECTRA FIRST IF QUESTIONS **    -------------------------------------------------------------------------------------------------------------------

## 2018-04-24 NOTE — Discharge Instr - AVS First Page (Addendum)
Discharge Instructions for Video Assisted Thoracosopic Surgery (VATS)    Follow-Up  Your follow-up appointment is on Tuesday, March 24th at 9:30 AM    You will need a chest x-ray on the day of your follow up appointment   You do not need to make an appointment to have the x-ray done   Simply hand carry the order form (which you were given at discharge) to [facility]   Please ask them to put a copy of the x-ray on a disc for you to bring to your follow up appointment    Home Care   Shower daily. Wash your incisions gently with mild soap and warm water. Pat dry and leave the incision open to air    Do not take a bath or swim until cleared by your surgeon   Use your incentive spirometer (blue) 10 times/hour for the first 2 weeks at home   Return to your normal diet as you feel able. Eat a healthy, well-balanced diet.   Avoid constipation  ? Narcotic pain medications (Dilaudid, Percocet, Vicodin) can cause constipation   ? Drink a variety of fluids and eat a high fiber diet to help avoid becoming constipated  ? You may use any over the counter laxative of your choice such as Miralax, Senokot, Dulcolax, fleets enema etc. if you become constipated    Instructions for Care of Chest Tube and Drainage Collection Chamber:       Shower as instructed in the hospital - remove dressing around the chest tube prior to showering and replace after showering as instructed in the hospital   Keep the collection chamber below chest level   Do NOT clamp the chest tube at any time    Once a day, at the same time each day, note the level of drainage in the collection chamber and record it.    Empty the collection chamber as you were instructed in the hospital when the level of the drainage reaches 400 mL      If you have any problems or questions please call [phone] during normal business hours or [phone] to speak to the surgeon on call if it is after business hours.      Medications   You will be given  instructions with your discharge paperwork regarding which medications you are to resume and which medications should be stopped   Pain Medication   We recommend the following pain medication regimen:   Acetaminophen (Tylenol) 500 mg - 2 tablets every 6 hours as needed for mild pain   Ibuprofen (Advil, Motrin) 200 mg - 2 tablets every 6 hours as needed for mild/moderate pain   Tramadol 50 mg - 1 tablet every 6 hours as needed for severe pain.        A sample schedule which works well for most patients is:  o Ibuprofen with Breakfast, Lunch, Dinner   o Tylenol in-between meals  o Tramadol for severe pain not alleviated by above    Activity   There are no restrictions on your activity   Dont worry if you are fatigued. Fatigue and weakness normally last a few weeks after surgery   You will gain strength as you recover completely   Walking is the best exercise. Walk every day and be active!!    Driving  Do not drive for 24 hours after taking any narcotic pain medications    Miscellaneous   Due to the breathing tube used during your operation and the nature  of lung surgery it is common to cough up blood tinged mucus or even small blood clots. This is normal and not cause for alarm   If however, you cough up large amounts of blood (larger than a tablespoon) then you need to notify your surgeon or the surgeon on call if it is after hours    Notify your surgeon at [phone] if you experience any of the following:   Fever greater than 101.0   Sudden shortness of breath   Significant redness, swelling, tenderness of your surgical incisions    Changes in scheduling: [phone]  Emergency or On-Call physician: [phone]      Dr. Dorothey Baseman Cell phone: Please call with any questions or concerns. If no answer please send a text message.

## 2018-04-24 NOTE — Plan of Care (Addendum)
Shift Note:     POD#3  S/P Thoracoscopy (VATS)     Major Shift Events: NONE    Orientation: AOx4, follow commands, know his need. In good spirit.   Rhythm on tele: SR- tachy  Cardiac: 110- 120's , HR in 100's  Respiratory: RA, Regular easy and unlabored  Ambulation: Independent.   Pain: L side chest. Incision site  Lines/Drips: PIV RUA,- saline locked CT L chest  GI/GU: Continent in both bowel/bladder, voiding freely.   BM this shift?  Skin: incision left chest.   Braden Score:    Fall Score: Moderate  Does the patient require an Interpreter?No.      Comments:   VSS, no fever , tachy at times.   On CT  wall  continuous suction. Dressing remain CDI -20 cm H20.   *didn't ambulated tonight - satates " he had 150 laps today"  Tolerating regular diet- low appetite per pt.   He denies sob/nv/ha/cp  Pain 5/10 - prn tramadol given with effective relief.   Refused Kepra IV - He stated "He only takes it PRN at home" no siezure activity observed.   ID ff, Pulm ff, Medicine ff    Plan:   IV antibiotics- IV ertapenem   Antifungal - VFEND PO  Vitals Q4H:   IS   Daily CXR  Medline placement maybe on Monday.   call MD with questions or concerns      Problem: Moderate/High Fall Risk Score >5  Goal: Patient will remain free of falls  Outcome: Progressing  Flowsheets (Taken 04/24/2018 2000)  Moderate Risk (6-13): MOD-Remain with patient during toileting;MOD-Place bedside commode and assistive devices out of sight when not in use;MOD-Re-orient confused patients;MOD-Consider a move closer to Nurses Station     Problem: Safety  Goal: Patient will be free from injury during hospitalization  Outcome: Progressing  Flowsheets (Taken 04/24/2018 0117)  Patient will be free from injury during hospitalization : Provide and maintain safe environment;Use appropriate transfer methods;Hourly rounding;Assess patient's risk for falls and implement fall prevention plan of care per policy;Ensure appropriate safety devices are available at the  bedside;Include patient/ family/ care giver in decisions related to safety  Goal: Patient will be free from infection during hospitalization  Outcome: Progressing  Flowsheets (Taken 04/24/2018 0117)  Free from Infection during hospitalization: Assess and monitor for signs and symptoms of infection;Monitor lab/diagnostic results;Encourage patient and family to use good hand hygiene technique     Problem: Discharge Barriers  Goal: Patient will be discharged home or other facility with appropriate resources  Outcome: Progressing  Flowsheets (Taken 04/24/2018 0117)  Discharge to home or other facility with appropriate resources: Provide appropriate patient education;Provide information on available health resources     Problem: Inadequate Gas Exchange  Goal: Adequate oxygenation and improved ventilation  Outcome: Progressing  Flowsheets (Taken 04/24/2018 0117)  Adequate oxygenation and improved ventilation: Assess lung sounds;Position for maximum ventilatory efficiency;Teach/reinforce use of incentive spirometer 10 times per hour while awake, cough and deep breath as needed;Increase activity as tolerated/progressive mobility  Goal: Patent Airway maintained  Outcome: Progressing  Flowsheets (Taken 04/24/2018 0117)  Patent airway maintained : Position patient for maximum ventilatory efficiency;Reinforce use of ordered respiratory interventions (i.e. CPAP, BiPAP, Incentive Spirometer, Acapella, etc.);Reposition patient every 2 hours and as needed unless able to self-reposition     Problem: Inadequate Airway Clearance  Goal: Normal respiratory rate/effort achieved/maintained  Outcome: Progressing  Flowsheets (Taken 04/24/2018 0117)  Normal respiratory rate/effort achieved/maintained: Plan activities to conserve energy: plan rest  periods     Problem: Neurological Deficit  Goal: Neurological status is stable or improving  Outcome: Progressing  Flowsheets (Taken 04/24/2018 0117)  Neurological status is stable or improving: Perform CAM  Assessment     Problem: Peripheral Neurovascular Impairment  Goal: Extremity color, movement, sensation are maintained or improved  Outcome: Progressing  Flowsheets (Taken 04/24/2018 0117)  Extremity color, movement, sensation are maintained or improved : Increase mobility as tolerated/progressive mobility

## 2018-04-24 NOTE — Progress Notes (Signed)
Pt refused IV Keppra, stated uses it as needed.   Notified thoracic team about keppra and X-ray result.   Will discontinue keppra and already Chest tube set -20cmH2O.   Continue to monitor

## 2018-04-24 NOTE — Plan of Care (Addendum)
Problem: Moderate/High Fall Risk Score >5  Goal: Patient will remain free of falls  Outcome: Progressing  Flowsheets (Taken 04/24/2018 1000)  Moderate Risk (6-13): MOD-Consider activation of bed alarm if appropriate;MOD-Remain with patient during toileting     Problem: Safety  Goal: Patient will be free from injury during hospitalization  Outcome: Progressing  Patient will be free from injury during hospitalization : Provide and maintain safe environment;Use appropriate transfer methods;Hourly rounding;Assess patient's risk for falls and implement fall prevention plan of care per policy;Ensure appropriate safety devices are available at the bedside;Include patient/ family/ care giver in decisions related to safety  Goal: Patient will be free from infection during hospitalization  Outcome: Progressing  Free from Infection during hospitalization: Assess and monitor for signs and symptoms of infection;Monitor lab/diagnostic results;Encourage patient and family to use good hand hygiene technique     Problem: Discharge Barriers  Goal: Patient will be discharged home or other facility with appropriate resources  Outcome: Progressing  Discharge to home or other facility with appropriate resources: Provide appropriate patient education;Provide information on available health resources     Problem: Inadequate Gas Exchange  Goal: Adequate oxygenation and improved ventilation  Outcome: Progressing  Adequate oxygenation and improved ventilation: Assess lung sounds;Position for maximum ventilatory efficiency;Teach/reinforce use of incentive spirometer 10 times per hour while awake, cough and deep breath as needed;Increase activity as tolerated/progressive mobility  Goal: Patent Airway maintained  Outcome: Progressing  Patent airway maintained : Position patient for maximum ventilatory efficiency;Reinforce use of ordered respiratory interventions (i.e. CPAP, BiPAP, Incentive Spirometer, Acapella, etc.);Reposition patient every 2  hours and as needed unless able to self-reposition     Problem: Inadequate Airway Clearance  Goal: Normal respiratory rate/effort achieved/maintained  Outcome: Progressing  Normal respiratory rate/effort achieved/maintained: Plan activities to conserve energy: plan rest periods     Problem: Neurological Deficit  Goal: Neurological status is stable or improving  Outcome: Progressing     Problem: Peripheral Neurovascular Impairment  Goal: Extremity color, movement, sensation are maintained or improved  Outcome: Progressing  Extremity color, movement, sensation are maintained or improved : Increase mobility as tolerated/progressive mobility         A/O x4. VSS. Afebrile. On RA.   Chest tube to wall suction. Monitor output.   Denied any respiratory distress.   Need assist as needed. Fall precaution and explained pt.   Tolerated with meals, denied nausea.   Tele monitor, NSR , ST.   Ambulates well   Incentive spirometry, reached 4000.   CXR in am.   Continue to monitor.

## 2018-04-24 NOTE — Plan of Care (Addendum)
Shift Note:     POD#2 S/P Thoracoscopy (VATS)     Major Shift Events: Continuous bubbles in CT. No bubbles with holding breath/pinched the tube- Addressd by MD.  Remain Asymptomatic.        Orientation: AOx4, follow commands, know his need. In good spirit.   Rhythm on tele: No tele order  Cardiac: 110- 120's , HR in 100's  Respiratory: RA, Regular easy and unlabor  Ambulation: Independent.   Pain: L side chest/abd pain.   Lines/Drips: PIV RUA,- saline locked CT L chest  GI/GU: Continent in both bowel/bladder, voiding freely.   BM this shift?   Skin: incision left chest/abd  Braden Score:    Fall Score: Moderate  Does the patient require an Interpreter? No.         Comments:    VSS, no fever , tachy at times.   On CT  wall  continuous suction. Dressing remain CDI  - MD changed suction -10 cm H20.   Ambulated multiple times ( see flowsheets).   Tolerating regular diet- low appetite per pt.   He denies sob/nv/ha/cp  Episode of uncontroled pain, medicated with tylenol and encouraged to walk more   PRN tramadol on board.   On Kepra IV q12 - no siezure activity observed.   ID ff, Pulm ff, Medicine ff  * pt requested to sleep at least 6-7 hrs with no disturbance. Reminded that per MD's order we have to follow - MD/Ressident made aware of pt request      Plan:   IV antibiotics- IV ertapenem   Vitals Q4H:   Ambulate 40 laps per day  IS   Daily CXR  call MD with questions or concerns     Care Plan:      Problem: Moderate/High Fall Risk Score >5  Goal: Patient will remain free of falls  Outcome: Progressing  Flowsheets (Taken 04/23/2018 2200)  Moderate Risk (6-13): MOD-Consider activation of bed alarm if appropriate;MOD-Floor mat at bedside (where available) if appropriate;MOD-Consider a move closer to Nurses Station;MOD-Remain with patient during toileting     Problem: Safety  Goal: Patient will be free from injury during hospitalization  Outcome: Progressing  Flowsheets (Taken 04/24/2018 0117)  Patient will be free from  injury during hospitalization : Provide and maintain safe environment; Use appropriate transfer methods; Hourly rounding; Assess patient's risk for falls and implement fall prevention plan of care per policy; Ensure appropriate safety devices are available at the bedside; Include patient/ family/ care giver in decisions related to safety  Goal: Patient will be free from infection during hospitalization  Outcome: Progressing  Flowsheets (Taken 04/24/2018 0117)  Free from Infection during hospitalization: Assess and monitor for signs and symptoms of infection; Monitor lab/diagnostic results; Encourage patient and family to use good hand hygiene technique     Problem: Discharge Barriers  Goal: Patient will be discharged home or other facility with appropriate resources  Outcome: Progressing  Flowsheets (Taken 04/24/2018 0117)  Discharge to home or other facility with appropriate resources: Provide appropriate patient education; Provide information on available health resources     Problem: Inadequate Gas Exchange  Goal: Adequate oxygenation and improved ventilation  Outcome: Progressing  Flowsheets (Taken 04/24/2018 0117)  Adequate oxygenation and improved ventilation: Assess lung sounds; Position for maximum ventilatory efficiency; Teach/reinforce use of incentive spirometer 10 times per hour while awake, cough and deep breath as needed; Increase activity as tolerated/progressive mobility  Intervention: Maintain chest tube(s). Monitor integrity of dressing, integrity of chest tube  system and character of drainage. Ensure chest tube is secured.  Note:   Hourly checked or while pt is awake.  Goal: Patent Airway maintained  Outcome: Progressing  Flowsheets (Taken 04/24/2018 0117)  Patent airway maintained : Position patient for maximum ventilatory efficiency; Reinforce use of ordered respiratory interventions (i.e. CPAP, BiPAP, Incentive Spirometer, Acapella, etc.); Reposition patient every 2 hours and as needed unless able to  self-reposition     Problem: Inadequate Airway Clearance  Goal: Normal respiratory rate/effort achieved/maintained  Outcome: Progressing  Flowsheets (Taken 04/24/2018 0117)  Normal respiratory rate/effort achieved/maintained: Plan activities to conserve energy: plan rest periods     Problem: Neurological Deficit  Goal: Neurological status is stable or improving  Outcome: Progressing  Flowsheets (Taken 04/24/2018 0117)  Neurological status is stable or improving: Perform CAM Assessment     Problem: Peripheral Neurovascular Impairment  Goal: Extremity color, movement, sensation are maintained or improved  Outcome: Progressing  Flowsheets (Taken 04/24/2018 0117)  Extremity color, movement, sensation are maintained or improved : Increase mobility as tolerated/progressive mobility

## 2018-04-24 NOTE — Progress Notes (Signed)
PULMONARY MEDICINE PROGRESS NOTE    Date Time: 04/24/18 1:47 PM  Patient Name: Craig Phelps Day: 9     Attending Physician:  Madilyn Hook, MD      Assessment:     Patient Active Problem List   Diagnosis    Pilonidal cyst    Seizure    Pneumothorax    GERD (gastroesophageal reflux disease)    Empyema    Lung abscess       1. Spontaneous PTX  2. Seizure d/o  3. Abscess/vs. 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 parapneumonic effusion    Recommendation:   Continue abx  Agree with Voriconazole.  ID following  CT to suction per Thoracic Surgery    Interval History     Interval History/24 hr. Events: CT with 232 cc drainage last 24 hours.  Remains to suction with air leak present.  He notes pain issues to site.  On room air.    Review of Systems:   Review of Systems - reviewed and no change from admission    Physical Exam:     Vitals:    04/24/18 0852   BP: 117/65   Pulse: 98   Resp: 16   Temp: 98 F (36.7 C)   SpO2: 98%     Temp (24hrs), Avg:97.4 F (36.3 C), Min:96.8 F (36 C), Max:98.4 F (36.9 C)      General appearance - alert, well appearing, and in no distress, in bed  Mental status - alert, oriented to person, place, and time  Nose - normal and patent, no erythema, discharge or polyps  Neck - supple, no significant adenopathy  Chest - normal air movement, no wheezing, rhonchi or crackles  Tube tidaling well  Heart - normal rate and regular rhythm  Abdomen - soft, nontender, nondistended, no masses or organomegaly  Neurological - alert, oriented, normal speech, no focal findings or movement disorder noted  Extremities - peripheral pulses normal, no pedal edema, no clubbing or cyanosis    Input/Output:     Intake and Output Summary (Last 24 hours) at Date Time    Intake/Output Summary (Last 24 hours) at 04/24/2018 1347  Last data filed at 04/24/2018 0545  Gross per 24 hour   Intake 1128 ml   Output 232 ml   Net 896 ml        Meds:     Current Facility-Administered Medications   Medication Dose Route Frequency    albuterol  2.5 mg Nebulization Q6H WA    budesonide  0.25 mg Nebulization BID    ertapenem  1,000 mg Intravenous Q24H    levETIRAcetam  500 mg Intravenous Q12H SCH    lidocaine  1 patch Transdermal Q24H    melatonin  3 mg Oral QHS    pantoprazole  40 mg Oral QAM AC    [START ON 04/25/2018] voriconazole  200 mg Oral Q12H SCH    voriconazole  400 mg Oral Q12H [facility]       Labs:     Recent Labs   Lab 04/23/18  0553 04/21/18  0748 04/19/18  0703   WBC 12.56* 11.81* 12.85*   RBC 4.57 4.84 4.57   Hgb 12.5 13.3 12.7   Hematocrit 37.7 39.3 37.6   Glucose 140* 89 81   BUN 10.0 6.0* 5.0*   Creatinine 0.8 0.8 0.9   Calcium 9.2 8.6 8.6   Sodium 137 141 139  Potassium 4.7 3.9 4.1   Chloride 105 106 105   CO2 20* 24 22         Radiology:     Radiology Results (24 Hour)     Procedure Component Value Units Date/Time    X-ray chest AP portable [[id number]] Collected:  04/24/18 0846    Order Status:  Completed Updated:  04/24/18 0855    Narrative:       History: Postop VATS, decortication    COMPARISON: 04/23/2018    FINDINGS: Portable chest demonstrates chest tube in the left apex. Left  pneumothorax is slightly larger compared to prior. There is some  subcutaneous air along the left chest wall. Normal cardiac silhouette  and vasculature. Right lung is clear      Impression:        Slightly larger left pneumothorax. Discussed with Younshil  at 0 850    Genelle Bal, MD   04/24/2018 8:51 AM          Imaging personally reviewed, including:         Signed by: Oley Balm, MD  Please tiger text first   Mobile # [phone]  [facility] [place], [place]  [phone]  [phone] Philis Kendall #1)  [phone] [facility] #2)

## 2018-04-24 NOTE — Progress Notes (Signed)
ID PROGRESS NOTE      Spectra: [phone]    Office: [phone]    Date Time: 04/24/18 @NOW   Patient Name: Craig Phelps, Craig Phelps    Problem List:      Acute:  Subacute pneumonitis, likely aspiration related as the patient mentions of GERD and frequent alcohol intoxication with vomiting episodes.  Pleural effusion  -- pigtail catheter placed  Lung abscess             -- sputum: mixed resp flora              -- pleural fluid: mod WBC, no growth, 10K WBC, pH- 7.87  Pleural/debris biopsy from 3/04 (updated report from 3/05)    -- many septate hyphae (fungal growth)    Chronic Conditions:  H/O tobacco use  H/O vaping 6 months before  Seizure Disorder  GERD  Pilonidal Cyst    Assessment:     Pneumonia with parapneumonic effusion and lung abscess. Persistent hydropneumothorax despite pigtail 2/27. Now s/p left VATS and decortication POD#2. Improving after procedure with IV abx. Will plan on a course of several weeks of IV abx, which we can coordinate through our office.    Antimicrobials:     abx #9  Ertapenem 1 gram IV daily #5    Plan:     Continue ertapenem  Will add Voriconazole. Loading with 400 mg q 12 hrs x 2 doses followed by 4mg /kg bid   Will need a midline  We will coordinate outpatient IV abx    Lines:     Patient Lines/Drains/Airways Status    Active PICC Line / CVC Line / PIV Line / Drain / Airway / Intraosseous Line / Epidural Line / ART Line / Line / Wound / Pressure Ulcer / NG/OG Tube     Name:   Placement date:   Placement time:   Site:   Days:    Peripheral IV 04/19/18 Anterior;Right Upper Arm   04/19/18    2056    Upper Arm   4    Chest Tube 1 Other (Comment) 28 Fr.   04/22/18    2210    --   1    Wound 04/22/18 Surgical Incision Chest Left DERMABOND   04/22/18    2237    Chest   1                Family History:     Family History   Problem Relation Age of Onset    ADD / ADHD Father     ADD / ADHD Brother     Cancer Paternal Grandfather 31        bladder, smoker    Pulmonary fibrosis Maternal  Grandmother         also with ITP    Alzheimer's disease Paternal Grandmother 84       Social History:     Social History     Socioeconomic History    Marital status: Single     Spouse name: Not on file    Number of children: 0    Years of education: Not on file    Highest education level: Not on file   Occupational History    Occupation: Archivist   Social Needs    Financial resource strain: Not on file    Food insecurity:     Worry: Not on file     Inability: Not on file    Transportation needs:  Medical: Not on file     Non-medical: Not on file   Tobacco Use    Smoking status: Former Smoker    Smokeless tobacco: Never Used    Tobacco comment: quit 1 week ago   Substance and Sexual Activity    Alcohol use: Not Currently     Alcohol/week: 0.0 standard drinks    Drug use: No    Sexual activity: Yes     Partners: Female     Comment: monogamous   Lifestyle    Physical activity:     Days per week: Not on file     Minutes per session: Not on file    Stress: Not on file   Relationships    Social connections:     Talks on phone: Not on file     Gets together: Not on file     Attends religious service: Not on file     Active member of club or organization: Not on file     Attends meetings of clubs or organizations: Not on file     Relationship status: Not on file    Intimate partner violence:     Fear of current or ex partner: Not on file     Emotionally abused: Not on file     Physically abused: Not on file     Forced sexual activity: Not on file   Other Topics Concern    Not on file   Social History Narrative    Taking a break from school    Wanting to transfer for the fall    Working at [employer]        Exercise: no     Diet: nothing special, tries to avoid GERD inducing foods       Allergies:     Allergies   Allergen Reactions    Other      Environmental allergies       Review of Systems:     Discomfort and some pain from the left sided chest tube. Denies dental pain, dysphagia,  dyspnea, diarrhea, dyspepsia, dysuria, dermatitis, dystonia, distress      Physical Exam:     Vitals:    04/24/18 0852   BP: 117/65   Pulse: 98   Resp: 16   Temp: 98 F (36.7 C)   SpO2: 98%       General Appearance: alert and appropriate, non-toxic  Neuro: alert, oriented, normal speech, normal attention and cognition  HEENT: no scleral icterus, pupils round and reactive, OP clear  Neck: supple  Lungs: symmetric expansion bilaterally,clears to ausculation, left CT with serosang drainage and air leak  Cardiac: normal rate, regular rhythm, normal S1, S2,   Abdomen: soft, non-tender, non-distended, normal active bowel sounds  Extremities: no pedal edema  Skin: no rash  Psych: normal mood and affect    Labs:     Lab Results   Component Value Date    WBC 12.56 (H) 04/23/2018    HGB 12.5 04/23/2018    HCT 37.7 04/23/2018    MCV 82.5 04/23/2018    PLT 542 (H) 04/23/2018     Lab Results   Component Value Date    CREAT 0.8 04/23/2018     Lab Results   Component Value Date    ALT 11 04/16/2018    AST 14 04/16/2018    ALKPHOS 93 04/16/2018    BILITOTAL 0.9 04/16/2018     No results found for: LACTATE    Microbiology:  Microbiology Results     Procedure Component Value Units Date/Time    AFB Culture & Smear [[id number]] Collected:  04/19/18 1053    Specimen:  Sputum from Pleural Fluid Updated:  04/19/18 1910    Narrative:       ORDER#: [id number]                                    ORDERED BY: Alinda Money  SOURCE: Pleural Fluid left                           COLLECTED:  04/19/18 10:53  ANTIBIOTICS AT COLL.:                                RECEIVED :  04/19/18 15:56  Stain, Acid Fast                           FINAL       04/19/18 19:10  04/19/18   No Acid Fast Bacillus Seen  Culture Acid Fast Bacillus (AFB)           PENDING      AFB culture and smear [[id number]] Collected:  04/22/18 2122    Specimen:  Sputum from Tissue, Deep Updated:  04/23/18 1132    Narrative:       ORDER#: [id number]                                     ORDERED BY: Dorothey Baseman, SANDE  SOURCE: Tissue, Deep LEFT ABCESS CAVITY              COLLECTED:  04/22/18 21:22  ANTIBIOTICS AT COLL.:                                RECEIVED :  04/23/18 02:26  Stain, Acid Fast                           FINAL       04/23/18 11:32  04/23/18   No Acid Fast Bacillus Seen  Culture Acid Fast Bacillus (AFB)           PENDING      AFB culture and smear [[id number]] Collected:  04/22/18 2040    Specimen:  Sputum from Tissue, Deep Updated:  04/23/18 1132    Narrative:       ORDER#: [id number]                                    ORDERED BY: Dorothey Baseman, SANDE  SOURCE: Tissue, Deep LEFT PLEURAL WITH DEBRIS        COLLECTED:  04/22/18 20:40  ANTIBIOTICS AT COLL.:                                RECEIVED :  04/23/18 02:30  Stain, Acid Fast  FINAL       04/23/18 11:32  04/23/18   No Acid Fast Bacillus Seen  Culture Acid Fast Bacillus (AFB)           PENDING      Anaerobic culture [[id number]] Collected:  04/22/18 2040    Specimen:  Other from Tissue Updated:  04/23/18 0230    Anaerobic culture [[id number]] Collected:  04/22/18 2122    Specimen:  Other from Abscess Updated:  04/23/18 0226    CULTURE + Dierdre Forth [[id number]] Collected:  04/17/18 1532    Specimen:  Sputum, Induced Updated:  04/19/18 1829    Narrative:       ORDER#: [id number]                                    ORDERED BY: Timothy Lasso  SOURCE: Sputum, Induced induce sputum                COLLECTED:  04/17/18 15:32  ANTIBIOTICS AT COLL.:                                RECEIVED :  04/17/18 18:14  Stain, Gram (Respiratory)                  FINAL       04/17/18 19:57  04/17/18   Moderate WBC's             Few Squamous epithelial cells             Moderate Mixed Respiratory Flora  Culture and Gram Stain, Aerobic, RespiratorFINAL       04/19/18 18:29  04/19/18   Moderate growth of mixed upper respiratory flora      Culture + Gram Stain,Aerobic, Body Fluid [[id number]] Collected:  04/19/18 1053    Specimen:   Body Fluid from Pleural Fluid Updated:  04/23/18 1525    Narrative:       ORDER#: [id number]                                    ORDERED BY: Alinda Money  SOURCE: Pleural Fluid left                           COLLECTED:  04/19/18 10:53  ANTIBIOTICS AT COLL.:                                RECEIVED :  04/19/18 15:55  Stain, Gram                                FINAL       04/19/18 18:12  04/19/18   Moderate WBCs             No organisms seen  Culture and Gram Stain, Aerobic, Body FluidFINAL       04/23/18 15:25  04/23/18   No growth      Fungus culture [[id number]] Collected:  04/22/18 2122    Specimen:  Other from Tissue Updated:  04/23/18 1130    Narrative:       ORDER#: [id number]  ORDERED BY: KHANDHAR, SANDE  SOURCE: Tissue LEFT ABCESS CAVITY                    COLLECTED:  04/22/18 21:22  ANTIBIOTICS AT COLL.:                                RECEIVED :  04/23/18 02:26  Stain, Fungal                              FINAL       04/23/18 11:29  04/23/18   No Fungal or Yeast Elements Seen  Culture Fungus                             PENDING      Fungus culture [[id number]] Collected:  04/22/18 2040    Specimen:  Other from Biopsy Updated:  04/23/18 1126    Narrative:       [id number] called Micro Results of pos fungal stain. Results read back [id number],  by [id number] on 04/23/2018 at 11:26  ORDER#: [id number]                                    ORDERED BY: [email or web address], SANDE  SOURCE: Biopsy LEFT PLEURAL WITH DEBRIS              COLLECTED:  04/22/18 20:40  ANTIBIOTICS AT COLL.:                                RECEIVED :  04/23/18 02:30  [id number] called Micro Results of pos fungal stain. Results read back [id number], by [id number] on 04/23/2018 at 11:26  Stain, Fungal                              FINAL       04/23/18 11:26   +  04/23/18   Many Septate Hyphae Seen  Culture Fungus                             PENDING      Legionella antigen, urine [[id number]] Collected:  04/20/18 2004    Specimen:  Urine, Catheterized,  Foley Updated:  04/21/18 0403    Narrative:       ORDER#: [id number]                                    ORDERED BY: Geraldine Solar  SOURCE: Urine, Catheterized, Foley                   COLLECTED:  04/20/18 20:04  ANTIBIOTICS AT COLL.:                                RECEIVED :  04/20/18 23:45  Legionella, Rapid Urinary Antigen          FINAL       04/21/18 04:03  04/21/18   Negative for Legionella pneumophila Serogroup 1 Antigen  Limitations of Test:             1. Negative results do not exclude infection with Legionella                pneumophila Serogroup 1.             2. Does not detect other serogroups of L. pneumophila                or other Legionella species.             Test Reference Range: Negative      Presurgical Surveillance, MSSA+MRSA [[id number]] Collected:  04/20/18 1630    Specimen:  Nares Updated:  04/21/18 1619     Culture Staph and MRSA Surveillance --     No Staph aureus isolated and  No Methicillin Resistant Staph aureus isolated      Narrative:       Please swab nares and throat    Presurgical Surveillance, MSSA+MRSA [[id number]] Collected:  04/20/18 1630    Specimen:  Throat Updated:  04/21/18 1619     Culture Staph and MRSA Surveillance --     No Staph aureus isolated and  No Methicillin Resistant Staph aureus isolated      S. PNEUMONIAE, RAPID URINARY ANTIGEN [[id number]] Collected:  04/20/18 2004    Specimen:  Urine, Clean Catch Updated:  04/21/18 0402    Narrative:       ORDER#: [id number]                                    ORDERED BY: Geraldine Solar  SOURCE: Urine, Clean Catch                           COLLECTED:  04/20/18 20:04  ANTIBIOTICS AT COLL.:                                RECEIVED :  04/20/18 23:45  S. pneumoniae, Rapid Urinary Antigen       FINAL       04/21/18 04:02  04/21/18   Negative for Streptococcus pneumoniae Urinary Antigen             Note:             This is a presumptive test for the direct qualitative             detection of bacterial antigen. This test  is not intended as             a substitute for a gram stain and bacterial culture. Samples             with extremely low levels of antigen may yield negative             results.             Reference Range: Negative      Wound culture & gram stain [[id number]] Collected:  04/22/18 2040    Specimen:  Wound Updated:  04/24/18 0805    Narrative:       ORDER#: [id number]                                    ORDERED BY: Letitia Libra  SOURCE: Wound LEFT PLEURAL WITH DEBRIS               COLLECTED:  04/22/18 20:40  ANTIBIOTICS AT COLL.:                                RECEIVED :  04/23/18 02:30  Stain, Gram                                FINAL       04/23/18 05:06  04/23/18   No Squamous epithelial cells seen             Many WBCs             No organisms seen  Culture and Gram Stain, Aerobic, Wound     PRELIM      04/24/18 08:05  04/24/18   Culture no growth to date, Final report to follow      Wound culture & gram stain [[id number]] Collected:  04/22/18 2122    Specimen:  Wound from Abscess Updated:  04/24/18 0805    Narrative:       ORDER#: [id number]                                    ORDERED BY: Dorothey Baseman, SANDE  SOURCE: Abscess LEFT ABCESS CAVITY                   COLLECTED:  04/22/18 21:22  ANTIBIOTICS AT COLL.:                                RECEIVED :  04/23/18 02:26  Stain, Gram                                FINAL       04/23/18 03:42  04/23/18   No Squamous epithelial cells seen             Moderate WBCs             No organisms seen  Culture and Gram Stain, Aerobic, Wound     PRELIM      04/24/18 08:05  04/24/18   Culture no growth to date, Final report to follow            Rads:   X-ray Chest Ap Portable    Result Date: 04/24/2018   Slightly larger left pneumothorax. Discussed with Younshil at 0 850 Genelle Bal, MD 04/24/2018 8:51 AM      Signed by: Despina Hick, MD, FACS  (Surgical Critical Care Fellow rotating in ID)

## 2018-04-25 ENCOUNTER — Inpatient Hospital Stay: Payer: BLUE CROSS/BLUE SHIELD

## 2018-04-25 MED ORDER — SODIUM CHLORIDE 0.9 % IV MBP
1000.00 mg | Freq: Every day | INTRAVENOUS | Status: DC
Start: 2018-04-25 — End: 2018-05-05
  Administered 2018-04-25 – 2018-05-05 (×11): 1000 mg via INTRAVENOUS
  Filled 2018-04-25 (×12): qty 1000

## 2018-04-25 NOTE — Plan of Care (Addendum)
Problem: Moderate/High Fall Risk Score >5  Goal: Patient will remain free of falls  Outcome: Progressing  Flowsheets (Taken 04/25/2018 1000)  Moderate Risk (6-13): MOD-Consider activation of bed alarm if appropriate;MOD-Remain with patient during toileting     Problem: Safety  Goal: Patient will be free from injury during hospitalization  Outcome: Progressing  Patient will be free from injury during hospitalization : Provide and maintain safe environment;Use appropriate transfer methods;Hourly rounding;Assess patient's risk for falls and implement fall prevention plan of care per policy;Ensure appropriate safety devices are available at the bedside;Include patient/ family/ care giver in decisions related to safety  Goal: Patient will be free from infection during hospitalization  Outcome: Progressing  Free from Infection during hospitalization: Assess and monitor for signs and symptoms of infection;Monitor lab/diagnostic results;Encourage patient and family to use good hand hygiene technique     Problem: Discharge Barriers  Goal: Patient will be discharged home or other facility with appropriate resources  Outcome: Progressing  Discharge to home or other facility with appropriate resources: Provide appropriate patient education;Provide information on available health resources     Problem: Inadequate Gas Exchange  Goal: Adequate oxygenation and improved ventilation  Outcome: Progressing  Adequate oxygenation and improved ventilation: Assess lung sounds;Position for maximum ventilatory efficiency;Teach/reinforce use of incentive spirometer 10 times per hour while awake, cough and deep breath as needed;Increase activity as tolerated/progressive mobility  Goal: Patent Airway maintained  Outcome: Progressing  Patent airway maintained : Position patient for maximum ventilatory efficiency;Reinforce use of ordered respiratory interventions (i.e. CPAP, BiPAP, Incentive Spirometer, Acapella, etc.);Reposition patient every 2  hours and as needed unless able to self-reposition     Problem: Inadequate Airway Clearance  Goal: Normal respiratory rate/effort achieved/maintained  Outcome: Progressing  Normal respiratory rate/effort achieved/maintained: Plan activities to conserve energy: plan rest periods     Problem: Neurological Deficit  Goal: Neurological status is stable or improving  Outcome: Progressing  Neurological status is stable or improving: Perform CAM Assessment     Problem: Peripheral Neurovascular Impairment  Goal: Extremity color, movement, sensation are maintained or improved  Outcome: Progressing  Extremity color, movement, sensation are maintained or improved : Increase mobility as tolerated/progressive mobility       A/O x4. VSS. Afebrile. On RA.   Chest tube to wall suction. Monitor output.   Denied any respiratory distress.   Need assist as needed. Fall precaution and explained pt.   Tolerated with meals, denied nausea.   Tele monitor, NSR , ST.   Ambulates well   Incentive spirometry, reached 4000.   CXR in am.   Continue to monitor.

## 2018-04-25 NOTE — Progress Notes (Signed)
Thoracic Surgery Daily Progress Note  [facility] - Thoracic Surgery   Team Spectra: [phone] / [phone]    Hospital Day: Hospital Day: 10  Post Operative Day: 3 Days Post-Op   Primary Procedure: Procedure(s) (LRB):  THORACOSCOPY, (VATS), DECORTICATION (Left)     Interval History:   NAEON. Ambulating working on IS aggressively. Doing well this morning.    Physical Exam:     Vitals:    04/24/18 1934 04/24/18 2300 04/25/18 0500 04/25/18 0741   BP: 121/68 118/71 95/71 101/61   Pulse: 97 (!) 110 89 100   Resp: 18 18 18 17    Temp: 97.6 F (36.4 C) 99.1 F (37.3 C) 97.5 F (36.4 C) 97.1 F (36.2 C)   TempSrc: Oral Oral Oral Oral   SpO2: 98% 99% 97% 98%   Weight:       Height:            PHYSICAL EXAM:  General: AAOx3, NAD  HEENT: Atraumatic, normocephalic, PERRLA  CV: RRR, no murmurs, rubs, gallops  Pulm: CTAB, no wheezes, rhonchi, left CT with 250cc SS fluid, +continuous 1+ air leak    Lines/Drains:   Left chest tube: 250cc SS output, + airleak continuous 1+    Laboratory and Radiology Results:     Recent Labs   Lab 04/23/18  0553 04/21/18  0748 04/19/18  0703   WBC 12.56* 11.81* 12.85*   RBC 4.57 4.84 4.57   Hgb 12.5 13.3 12.7   Hematocrit 37.7 39.3 37.6   Sodium 137 141 139   Potassium 4.7 3.9 4.1   Chloride 105 106 105   CO2 20* 24 22   BUN 10.0 6.0* 5.0*   Creatinine 0.8 0.8 0.9   Calcium 9.2 8.6 8.6   Magnesium 2.2  --   --    Glucose 140* 89 81                 No results found.     Assessment:   Craig Phelps is a 23 y.o. male with left lung abscess and empyema s/p pigtail 2/27 with a persistent left hydropneumothorax now 3 Days Post-Op s/p left VATS, decortication. Doing well post-operatively.    Plan:   -- continue left chest tube to continuous suction - , even when ambulating  -- continue vanc/ ertapenem per ID, patient will need prolonged abx course, +fungal cultures, cont voriconazole   -- will need Midline on discharge  -- follow up OR cultures and pathology  -- daily morning chest x-ray  -- tylenol & PRN  tramadol for pain control  -- Encourage aggressive pulm toilet with ambulation > 80 laps daily, IS, PEP/PAP therapy, duo nebs    [name], MD  Thoracic Surgery, R4      ** PLEASE DO NOT PAGE - CALL SPECTRA FIRST IF QUESTIONS **    -------------------------------------------------------------------------------------------------------------------

## 2018-04-26 ENCOUNTER — Inpatient Hospital Stay: Payer: BLUE CROSS/BLUE SHIELD

## 2018-04-26 NOTE — Progress Notes (Signed)
PULMONARY MEDICINE PROGRESS NOTE    Date Time: 04/26/18 9:13 PM  Patient Name: [facility] Day: 11     Attending Physician:  Madilyn Hook, MD      Assessment:     Patient Active Problem List   Diagnosis    Pilonidal cyst    Seizure    Pneumothorax    GERD (gastroesophageal reflux disease)    Empyema    Lung abscess       1. Spontaneous PTX  2. Seizure d/o  3. Abscess/vs. 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 parapneumonic effusion    Recommendation:   Continue abx  Agree with Voriconazole.  ID following  CT to suction per Thoracic Surgery    Interval History     Interval History/24 hr. Events: CT with 150cc drainage last 24 hours.  Remains to suction.  Air leak present. Remains on room air.    Review of Systems:   Review of Systems - reviewed and no change from admission    Physical Exam:     Vitals:    04/26/18 2000   BP: 125/70   Pulse: 88   Resp: 16   Temp: 98.7 F (37.1 C)   SpO2: 99%     Temp (24hrs), Avg:97.2 F (36.2 C), Min:96.3 F (35.7 C), Max:98.7 F (37.1 C)      General appearance - alert, well appearing, and in no distress, in bed  Mental status - alert, oriented to person, place, and time  Nose - normal and patent, no erythema, discharge or polyps  Neck - supple, no significant adenopathy  Chest - normal air movement, no wheezing, rhonchi or crackles  Tube tidaling well  Heart - normal rate and regular rhythm  Abdomen - soft, nontender, nondistended, no masses or organomegaly  Neurological - alert, oriented, normal speech, no focal findings or movement disorder noted  Extremities - peripheral pulses normal, no pedal edema, no clubbing or cyanosis    Input/Output:     Intake and Output Summary (Last 24 hours) at Date Time    Intake/Output Summary (Last 24 hours) at 04/26/2018 2113  Last data filed at 04/26/2018 2000  Gross per 24 hour   Intake 850 ml   Output 150 ml   Net 700 ml       Meds:     Current  Facility-Administered Medications   Medication Dose Route Frequency    albuterol  2.5 mg Nebulization Q6H WA    budesonide  0.25 mg Nebulization BID    ertapenem  1,000 mg Intravenous Daily    levETIRAcetam  500 mg Intravenous Q12H SCH    lidocaine  1 patch Transdermal Q24H    melatonin  3 mg Oral QHS    pantoprazole  40 mg Oral QAM AC    voriconazole  200 mg Oral Q12H [facility]       Labs:     Recent Labs   Lab 04/23/18  0553 04/21/18  0748   WBC 12.56* 11.81*   RBC 4.57 4.84   Hgb 12.5 13.3   Hematocrit 37.7 39.3   Glucose 140* 89   BUN 10.0 6.0*   Creatinine 0.8 0.8   Calcium 9.2 8.6   Sodium 137 141   Potassium 4.7 3.9   Chloride 105 106   CO2 20* 24         Radiology:     Radiology Results (24 Hour)  Procedure Component Value Units Date/Time    X-ray chest AP portable [[id number]] Collected:  04/26/18 0842    Order Status:  Completed Updated:  04/26/18 0848    Narrative:       History: Postop thoracic surgery.    FINDINGS: An AP portable upright view was obtained and is compared to  04/25/2018.    There is no change since the last exam. There is a 5% left pneumothorax  with a left pleural tube in place. There is a small focal opacity at the  lateral aspect of the left midlung. No pleural effusion is seen. The  mediastinum is normal.      Impression:         5% left thorax with a left pleural tube in place unchanged.    Lanier Ensign, MD   04/26/2018 8:44 AM          Imaging personally reviewed, including:         Signed by: Oley Balm, MD  Please tiger text first   Mobile # [phone]  [facility] [place], [place]  [phone]  [phone] [facility] [address]1)  [phone] [facility] #2)

## 2018-04-26 NOTE — Plan of Care (Addendum)
Problem: Moderate/High Fall Risk Score >5  Goal: Patient will remain free of falls  Outcome: Progressing  Flowsheets (Taken 04/25/2018 2300)  Moderate Risk (6-13): MOD-Consider activation of bed alarm if appropriate     Problem: Safety  Goal: Patient will be free from injury during hospitalization  Outcome: Progressing  Flowsheets (Taken 04/26/2018 0300)  Patient will be free from injury during hospitalization : Assess patient's risk for falls and implement fall prevention plan of care per policy; Provide and maintain safe environment; Use appropriate transfer methods; Hourly rounding; Include patient/ family/ care giver in decisions related to safety; Ensure appropriate safety devices are available at the bedside; Assess for patients risk for elopement and implement Elopement Risk Plan per policy; Provide alternative method of communication if needed (communication boards, writing)  Goal: Patient will be free from infection during hospitalization  Outcome: Progressing  Flowsheets (Taken 04/26/2018 0300)  Free from Infection during hospitalization: Assess and monitor for signs and symptoms of infection; Monitor lab/diagnostic results; Encourage patient and family to use good hand hygiene technique; Monitor all insertion sites (i.e. indwelling lines, tubes, urinary catheters, and drains)     Problem: Discharge Barriers  Goal: Patient will be discharged home or other facility with appropriate resources  Outcome: Progressing  Flowsheets (Taken 04/26/2018 0300)  Discharge to home or other facility with appropriate resources: Initiate discharge planning     Problem: Inadequate Gas Exchange  Goal: Patent Airway maintained  Outcome: Progressing  Flowsheets (Taken 04/26/2018 0300)  Patent airway maintained : Position patient for maximum ventilatory efficiency; Suction secretions as needed; Reinforce use of ordered respiratory interventions (i.e. CPAP, BiPAP, Incentive Spirometer, Acapella, etc.); Reposition patient every 2 hours  and as needed unless able to self-reposition     Problem: Inadequate Airway Clearance  Goal: Normal respiratory rate/effort achieved/maintained  Outcome: Progressing  Flowsheets (Taken 04/26/2018 0300)  Normal respiratory rate/effort achieved/maintained: Plan activities to conserve energy: plan rest periods     Problem: Neurological Deficit  Goal: Neurological status is stable or improving  Outcome: Progressing  Flowsheets (Taken 04/26/2018 0300)  Neurological status is stable or improving: Perform CAM Assessment     Problem: Peripheral Neurovascular Impairment  Goal: Extremity color, movement, sensation are maintained or improved  Outcome: Progressing  Flowsheets (Taken 04/26/2018 0300)  Extremity color, movement, sensation are maintained or improved : Increase mobility as tolerated/progressive mobility; Assess extremity for proper alignment    Pt is A&Ox4. RA. With minimal to moderate complaints of left chest pain at CT site area which increases with movement treated with prn Tramadol with some pain relief.   CT attached to Continuous wall suction on -20cm H2O, dressing at the site is c/d/I.   Atrium noted with continuous Bubbling MD is aware regarding this.   Air leak 0.  No bleeding noted on CT site.   Pt was able to ambulated 8 more laps before midnight, total for 40 laps yesterday.   Pt was able to perform on his IS reach at 3750.   VSS. Pt refused his night dose Keppra , as per pt stated he only takes it when he needs it.   Pt also does not want to be disturbed 11pm to 7am, also wants his labs to be done after 7am.    Suction set up at the bedside.           Pt's mom also has some questions regarding medications.             On Telemetry showing NSR.  Chest X-Ray done @5 :31AM Result to Follow up.  High Fall risks interventions were implemented, bed at the lowest position, call bell within reach fall mats in placed, side rails up 3/4.   Rounding with the pt. Will continue to monitor the pt for any changes.  Needs attended.

## 2018-04-26 NOTE — Plan of Care (Signed)
Problem: Moderate/High Fall Risk Score >5  Goal: Patient will remain free of falls  Outcome: Progressing     Problem: Safety  Goal: Patient will be free from injury during hospitalization  Outcome: Progressing  Goal: Patient will be free from infection during hospitalization  Outcome: Progressing     Problem: Discharge Barriers  Goal: Patient will be discharged home or other facility with appropriate resources  Outcome: Progressing     Problem: Inadequate Gas Exchange  Goal: Adequate oxygenation and improved ventilation  Outcome: Progressing  Flowsheets (Taken 04/26/2018 0300 by Fabian Sharp, RN)  Adequate oxygenation and improved ventilation: Assess lung sounds;Monitor SpO2 and treat as needed;Provide mechanical and oxygen support to facilitate gas exchange;Position for maximum ventilatory efficiency;Teach/reinforce use of incentive spirometer 10 times per hour while awake, cough and deep breath as needed;Increase activity as tolerated/progressive mobility;Plan activities to conserve energy: plan rest periods  Goal: Patent Airway maintained  Outcome: Progressing  Flowsheets (Taken 04/26/2018 0300 by Fabian Sharp, RN)  Patent airway maintained : Position patient for maximum ventilatory efficiency;Suction secretions as needed;Reinforce use of ordered respiratory interventions (i.e. CPAP, BiPAP, Incentive Spirometer, Acapella, etc.);Reposition patient every 2 hours and as needed unless able to self-reposition     Problem: Inadequate Airway Clearance  Goal: Normal respiratory rate/effort achieved/maintained  Outcome: Progressing     Problem: Neurological Deficit  Goal: Neurological status is stable or improving  Outcome: Progressing  Flowsheets (Taken 04/26/2018 1320)  Neurological status is stable or improving: Monitor/assess/document neurological assessment (Stroke: every 4 hours); Monitor/assess NIH Stroke Scale     Problem: Peripheral Neurovascular Impairment  Goal: Extremity color, movement, sensation are  maintained or improved  Outcome: Progressing  Flowsheets (Taken 04/26/2018 1320)  Extremity color, movement, sensation are maintained or improved : Increase mobility as tolerated/progressive mobility; Assess and monitor application of corrective devices (cast, brace, splint), check skin integrity     Patient A&Ox4, VSS  Left chest tube to -20 cont suction  Dressing C/D/I  Continuous bubbling- MD aware   IS, walked 40 laps so far   Pain well controlled   Will need midline for D/C  OOB to chair   Will continue to monitor

## 2018-04-26 NOTE — Progress Notes (Signed)
Thoracic Surgery Daily Progress Note  [facility] - Thoracic Surgery   Team Spectra: [phone] / [phone]    Hospital Day: Hospital Day: 11  Post Operative Day: 4 Days Post-Op   Primary Procedure: Procedure(s) (LRB):  THORACOSCOPY, (VATS), DECORTICATION (Left)     Interval History:   NAEON.    Physical Exam:     Vitals:    04/25/18 1651 04/25/18 1946 04/25/18 2302 04/26/18 0739   BP: 111/72 118/82 125/72 124/77   Pulse: 99 96 80 82   Resp: 18 16 20 20    Temp: (!) 95.7 F (35.4 C) 98.7 F (37.1 C) 97.4 F (36.3 C) (!) 96.3 F (35.7 C)   TempSrc: Axillary Oral Oral Oral   SpO2: 100% 97% 97% 97%   Weight:       Height:            PHYSICAL EXAM:  General: AAOx3, NAD  HEENT: Atraumatic, normocephalic, PERRLA  CV: RRR, no murmurs, rubs, gallops  Pulm: CTAB, no wheezes, rhonchi, left CT with 150cc SS fluid, +continuous 1+ air leak    Lines/Drains:   Left chest tube: 150cc SS output, + airleak continuous 1+    Laboratory and Radiology Results:     Recent Labs   Lab 04/23/18  0553 04/21/18  0748   WBC 12.56* 11.81*   RBC 4.57 4.84   Hgb 12.5 13.3   Hematocrit 37.7 39.3   Sodium 137 141   Potassium 4.7 3.9   Chloride 105 106   CO2 20* 24   BUN 10.0 6.0*   Creatinine 0.8 0.8   Calcium 9.2 8.6   Magnesium 2.2  --    Glucose 140* 89                 X-ray Chest Ap Portable    Result Date: 04/26/2018  5% left thorax with a left pleural tube in place unchanged. Lanier Ensign, MD 04/26/2018 8:44 AM       Assessment:   Craig Phelps is a 23 y.o. male with left lung abscess and empyema s/p pigtail 2/27 with a persistent left hydropneumothorax now 4 Days Post-Op s/p left VATS, decortication. Doing well post-operatively.    Plan:   -- continue left chest tube to continuous suction - , even when ambulating  -- continue vanc/ ertapenem per ID, patient will need prolonged abx course, +fungal cultures, cont voriconazole   -- will need Midline on discharge  -- follow up OR cultures and pathology  -- daily morning chest x-ray  -- tylenol & PRN  tramadol for pain control  -- Encourage aggressive pulm toilet with ambulation > 80 laps daily, IS, PEP/PAP therapy, duo nebs    [name], MD  Thoracic Surgery, R4      ** PLEASE DO NOT PAGE - CALL SPECTRA FIRST IF QUESTIONS **    -------------------------------------------------------------------------------------------------------------------

## 2018-04-26 NOTE — Progress Notes (Signed)
ID PROGRESS NOTE      Spectra: [phone]    Office: [phone]    Date Time: 04/26/18 @NOW   Patient Name: Craig Phelps, Craig Phelps    Problem List:    Acute:  Subacute pneumonitis, likely aspiration related as the patient mentions of GERD and frequent alcohol intoxication with vomiting episodes.  Pleural effusion  -- pigtail catheter placed  Lung abscess  -- sputum: mixed resp flora  -- pleural fluid: mod WBC, no growth, 10K WBC, pH- 7.87  Pleural/debris biopsy from 3/04 (updated report from 3/05)               -- many septate hyphae (fungal growth).  Multiple Cx positive    Chronic Conditions:  H/O tobacco use  H/O vaping 6 months before  Seizure Disorder  GERD  Pilonidal Cyst    Assessment:   Empyema associated with a cavitary lesion.  Pleural material now noted to contain hyphae.  Pt is doing well clinically.  Hx of frequent vomiting, including due to excesses of alcohol.  No evidence of immunosuppression.  In my opinion the most likely chain of events is a cavity which formed due to aspiration becoming superinfected with fungal organism and leading to rupture into the pleural space.      Multiple cultures with fungus.  ID pending    Antimicrobials:   abx #11  Ertapenem 1 gram IV daily #7  #3 Voriconazole 200mg  PO BID    Plan:   Continue Ertapenem for possible bacterial component  Continue Voriconazole  ID of the fungus pending  Fungal markers pending  Discussed with pt and mother    Lines:   piv    Family History:     Family History   Problem Relation Age of Onset    ADD / ADHD Father     ADD / ADHD Brother     Cancer Paternal Grandfather 24        bladder, smoker    Pulmonary fibrosis Maternal Grandmother         also with ITP    Alzheimer's disease Paternal Grandmother 55       Social History:     Social History     Socioeconomic History    Marital status: Single     Spouse name: Not on file    Number of children: 0    Years of education: Not on file    Highest education  level: Not on file   Occupational History    Occupation: Archivist   Social Needs    Financial resource strain: Not on file    Food insecurity:     Worry: Not on file     Inability: Not on file    Transportation needs:     Medical: Not on file     Non-medical: Not on file   Tobacco Use    Smoking status: Former Smoker    Smokeless tobacco: Never Used    Tobacco comment: quit 1 week ago   Substance and Sexual Activity    Alcohol use: Not Currently     Alcohol/week: 0.0 standard drinks    Drug use: No    Sexual activity: Yes     Partners: Female     Comment: monogamous  Social History Narrative    Taking a break from school    Wanting to transfer for the fall    Working at [employer]        Exercise: no     Diet: nothing special, tries to avoid GERD inducing foods       Allergies:     Allergies   Allergen Reactions    Other      Environmental allergies       Review of Systems:   General ROS: negative for - chills, fevers, night sweats, weight loss   HEENT: negative for - blurry vision, sore throat, thrush   Respiratory ROS: negative for cough, SOB  Cardiovascular ROS: negative for - chest pain, palpitations   Gastrointestinal ROS: negative for - abdominal pain, nausea, vomiting, diarrhea  Genito-Urinary ROS: negative for - dysuria, urinary frequency/urgency   Musculoskeletal ROS: negative for - joint pain, joint stiffness or muscle pain   Dermatological ROS: negative for - rash and skin lesion changes   Neurological ROS: negative for - confusion, headache, dizziness  Hematological ROS: negative for - bruising, bleeding   Psychological ROS: negative for - changes in mood    Physical Exam:     Vitals:    04/26/18 1515   BP: 129/62   Pulse: 82   Resp: 16   Temp: (!) 96.3 F (35.7 C)   SpO2: 100%       General Appearance: alert and appropriate, non-toxic  Neuro: alert, oriented, normal speech, normal  attention and cognition  HEENT: no scleral icterus, pupils round and reactive, OP clear  Extremities: no pedal edema  Skin: no rash  Psych: normal mood and affect    Labs:     Lab Results   Component Value Date    WBC 12.56 (H) 04/23/2018    HGB 12.5 04/23/2018    HCT 37.7 04/23/2018    MCV 82.5 04/23/2018    PLT 542 (H) 04/23/2018     Lab Results   Component Value Date    CREAT 0.8 04/23/2018     Lab Results   Component Value Date    ALT 11 04/16/2018    AST 14 04/16/2018    ALKPHOS 93 04/16/2018    BILITOTAL 0.9 04/16/2018     No results found for: LACTATE    Microbiology:     Microbiology Results     Procedure Component Value Units Date/Time    AFB Culture & Smear [[id number]] Collected:  04/19/18 1053    Specimen:  Sputum from Pleural Fluid Updated:  04/19/18 1910    Narrative:       ORDER#: [id number]                                    ORDERED BY: Alinda Money  SOURCE: Pleural Fluid left                           COLLECTED:  04/19/18 10:53  ANTIBIOTICS AT COLL.:                                RECEIVED :  04/19/18 15:56  Stain, Acid Fast                           FINAL  04/19/18 19:10  04/19/18   No Acid Fast Bacillus Seen  Culture Acid Fast Bacillus (AFB)           PENDING      AFB culture and smear [[id number]] Collected:  04/22/18 2122    Specimen:  Sputum from Tissue, Deep Updated:  04/23/18 1132    Narrative:       ORDER#: [id number]                                    ORDERED BY: Dorothey Baseman, SANDE  SOURCE: Tissue, Deep LEFT ABCESS CAVITY              COLLECTED:  04/22/18 21:22  ANTIBIOTICS AT COLL.:                                RECEIVED :  04/23/18 02:26  Stain, Acid Fast                           FINAL       04/23/18 11:32  04/23/18   No Acid Fast Bacillus Seen  Culture Acid Fast Bacillus (AFB)           PENDING      AFB culture and smear [[id number]] Collected:  04/22/18 2040    Specimen:  Sputum from Tissue, Deep Updated:  04/23/18 1132    Narrative:       ORDER#: [id number]                                     ORDERED BY: Dorothey Baseman, SANDE  SOURCE: Tissue, Deep LEFT PLEURAL WITH DEBRIS        COLLECTED:  04/22/18 20:40  ANTIBIOTICS AT COLL.:                                RECEIVED :  04/23/18 02:30  Stain, Acid Fast                           FINAL       04/23/18 11:32  04/23/18   No Acid Fast Bacillus Seen  Culture Acid Fast Bacillus (AFB)           PENDING      Anaerobic culture [[id number]] Collected:  04/22/18 2040    Specimen:  Other from Tissue Updated:  04/25/18 1145    Narrative:       ORDER#: [id number]                                    ORDERED BY: Dorothey Baseman, SANDE  SOURCE: Tissue LEFT PLEURAL WITH DEBRIS              COLLECTED:  04/22/18 20:40  ANTIBIOTICS AT COLL.:                                RECEIVED :  04/23/18 02:30  Culture, Anaerobic Bacteria                PRELIM  04/25/18 11:45  04/25/18   No growth to date, final report to follow      Anaerobic culture [[id number]] Collected:  04/22/18 2122    Specimen:  Other from Abscess Updated:  04/25/18 1144    Narrative:       ORDER#: [id number]                                    ORDERED BY: Dorothey Baseman, SANDE  SOURCE: Abscess LEFT ABCESS CAVITY                   COLLECTED:  04/22/18 21:22  ANTIBIOTICS AT COLL.:                                RECEIVED :  04/23/18 02:26  Culture, Anaerobic Bacteria                PRELIM      04/25/18 11:44  04/25/18   No growth to date, final report to follow      CULTURE + Dierdre Forth [[id number]] Collected:  04/17/18 1532    Specimen:  Sputum, Induced Updated:  04/19/18 1829    Narrative:       ORDER#: [id number]                                    ORDERED BY: Timothy Lasso  SOURCE: Sputum, Induced induce sputum                COLLECTED:  04/17/18 15:32  ANTIBIOTICS AT COLL.:                                RECEIVED :  04/17/18 18:14  Stain, Gram (Respiratory)                  FINAL       04/17/18 19:57  04/17/18   Moderate WBC's             Few Squamous epithelial cells             Moderate Mixed Respiratory  Flora  Culture and Gram Stain, Aerobic, RespiratorFINAL       04/19/18 18:29  04/19/18   Moderate growth of mixed upper respiratory flora      Culture + Gram Stain,Aerobic, Body Fluid [[id number]] Collected:  04/19/18 1053    Specimen:  Body Fluid from Pleural Fluid Updated:  04/23/18 1525    Narrative:       ORDER#: [id number]                                    ORDERED BY: Alinda Money  SOURCE: Pleural Fluid left                           COLLECTED:  04/19/18 10:53  ANTIBIOTICS AT COLL.:                                RECEIVED :  04/19/18 15:55  Stain, Gram  FINAL       04/19/18 18:12  04/19/18   Moderate WBCs             No organisms seen  Culture and Gram Stain, Aerobic, Body FluidFINAL       04/23/18 15:25  04/23/18   No growth      Fungus culture [[id number]] Collected:  04/22/18 2122    Specimen:  Other from Tissue Updated:  04/23/18 1130    Narrative:       ORDER#: [id number]                                    ORDERED BY: Dorothey Baseman, SANDE  SOURCE: Tissue LEFT ABCESS CAVITY                    COLLECTED:  04/22/18 21:22  ANTIBIOTICS AT COLL.:                                RECEIVED :  04/23/18 02:26  Stain, Fungal                              FINAL       04/23/18 11:29  04/23/18   No Fungal or Yeast Elements Seen  Culture Fungus                             PENDING      Fungus culture [[id number]] Collected:  04/22/18 2040    Specimen:  Other from Biopsy Updated:  04/23/18 1126    Narrative:       [id number] called Micro Results of pos fungal stain. Results read back [id number],  by [id number] on 04/23/2018 at 11:26  ORDER#: [id number]                                    ORDERED BY: JOACZYSA, SANDE  SOURCE: Biopsy LEFT PLEURAL WITH DEBRIS              COLLECTED:  04/22/18 20:40  ANTIBIOTICS AT COLL.:                                RECEIVED :  04/23/18 02:30  [id number] called Micro Results of pos fungal stain. Results read back [id number], by [id number] on 04/23/2018 at 11:26  Stain, Fungal                               FINAL       04/23/18 11:26   +  04/23/18   Many Septate Hyphae Seen  Culture Fungus                             PENDING      Legionella antigen, urine [[id number]] Collected:  04/20/18 2004    Specimen:  Urine, Catheterized, Foley Updated:  04/21/18 0403    Narrative:       ORDER#: [id number]  ORDERED BY: Geraldine Solar  SOURCE: Urine, Catheterized, Foley                   COLLECTED:  04/20/18 20:04  ANTIBIOTICS AT COLL.:                                RECEIVED :  04/20/18 23:45  Legionella, Rapid Urinary Antigen          FINAL       04/21/18 04:03  04/21/18   Negative for Legionella pneumophila Serogroup 1 Antigen             Limitations of Test:             1. Negative results do not exclude infection with Legionella                pneumophila Serogroup 1.             2. Does not detect other serogroups of L. pneumophila                or other Legionella species.             Test Reference Range: Negative      Presurgical Surveillance, MSSA+MRSA [[id number]] Collected:  04/20/18 1630    Specimen:  Nares Updated:  04/21/18 1619     Culture Staph and MRSA Surveillance --     No Staph aureus isolated and  No Methicillin Resistant Staph aureus isolated      Narrative:       Please swab nares and throat    Presurgical Surveillance, MSSA+MRSA [[id number]] Collected:  04/20/18 1630    Specimen:  Throat Updated:  04/21/18 1619     Culture Staph and MRSA Surveillance --     No Staph aureus isolated and  No Methicillin Resistant Staph aureus isolated      S. PNEUMONIAE, RAPID URINARY ANTIGEN [[id number]] Collected:  04/20/18 2004    Specimen:  Urine, Clean Catch Updated:  04/21/18 0402    Narrative:       ORDER#: [id number]                                    ORDERED BY: Geraldine Solar  SOURCE: Urine, Clean Catch                           COLLECTED:  04/20/18 20:04  ANTIBIOTICS AT COLL.:                                RECEIVED :  04/20/18 23:45  S. pneumoniae, Rapid Urinary Antigen        FINAL       04/21/18 04:02  04/21/18   Negative for Streptococcus pneumoniae Urinary Antigen             Note:             This is a presumptive test for the direct qualitative             detection of bacterial antigen. This test is not intended as             a substitute for a gram stain and bacterial culture. Samples  with extremely low levels of antigen may yield negative             results.             Reference Range: Negative      Wound culture & gram stain [[id number]] Collected:  04/22/18 2122    Specimen:  Wound from Abscess Updated:  04/26/18 0814    Narrative:       ORDER#: [id number]                                    ORDERED BY: Dorothey Baseman, SANDE  SOURCE: Abscess LEFT ABCESS CAVITY                   COLLECTED:  04/22/18 21:22  ANTIBIOTICS AT COLL.:                                RECEIVED :  04/23/18 02:26  Stain, Gram                                FINAL       04/23/18 03:42  04/23/18   No Squamous epithelial cells seen             Moderate WBCs             No organisms seen  Culture and Gram Stain, Aerobic, Wound     PRELIM      04/26/18 08:14   +  04/24/18   Culture no growth to date, Final report to follow  04/25/18   Light growth of Mycelial Fungus               Refer to Identification on culture #[id number]        Wound culture & gram stain [[id number]] Collected:  04/22/18 2040    Specimen:  Wound Updated:  04/26/18 0814    Narrative:       ORDER#: [id number]                                    ORDERED BY: Dorothey Baseman, SANDE  SOURCE: Wound LEFT PLEURAL WITH DEBRIS               COLLECTED:  04/22/18 20:40  ANTIBIOTICS AT COLL.:                                RECEIVED :  04/23/18 02:30  Stain, Gram                                FINAL       04/23/18 05:06  04/23/18   No Squamous epithelial cells seen             Many WBCs             No organisms seen  Culture and Gram Stain, Aerobic, Wound     PRELIM      04/26/18 08:14   +  04/24/18   Culture no growth to date, Final report to  follow  04/25/18   Moderate growth of Mycelial Fungus  Identification to follow              Rads:   X-ray Chest Ap Portable    Result Date: 04/26/2018  5% left thorax with a left pleural tube in place unchanged. Lanier Ensign, MD 04/26/2018 8:44 AM      Signed by: Micheline Rough, MD

## 2018-04-26 NOTE — Plan of Care (Signed)
Problem: Moderate/High Fall Risk Score >5  Goal: Patient will remain free of falls  Outcome: Progressing     Problem: Safety  Goal: Patient will be free from injury during hospitalization  Outcome: Progressing  Patient will be free from injury during hospitalization : Assess patient's risk for falls and implement fall prevention plan of care per policy;Provide and maintain safe environment;Use appropriate transfer methods;Hourly rounding;Include patient/ family/ care giver in decisions related to safety;Ensure appropriate safety devices are available at the bedside;Assess for patients risk for elopement and implement Elopement Risk Plan per policy;Provide alternative method of communication if needed (communication boards, writing)  Goal: Patient will be free from infection during hospitalization  Outcome: Progressing  Free from Infection during hospitalization: Assess and monitor for signs and symptoms of infection;Monitor lab/diagnostic results;Encourage patient and family to use good hand hygiene technique;Monitor all insertion sites (i.e. indwelling lines, tubes, urinary catheters, and drains)     Problem: Discharge Barriers  Goal: Patient will be discharged home or other facility with appropriate resources  Outcome: Progressing  Discharge to home or other facility with appropriate resources: Initiate discharge planning     Problem: Inadequate Gas Exchange  Goal: Adequate oxygenation and improved ventilation  Outcome: Progressing  Adequate oxygenation and improved ventilation: Assess lung sounds;Monitor SpO2 and treat as needed;Provide mechanical and oxygen support to facilitate gas exchange;Position for maximum ventilatory efficiency;Teach/reinforce use of incentive spirometer 10 times per hour while awake, cough and deep breath as needed;Increase activity as tolerated/progressive mobility;Plan activities to conserve energy: plan rest periods  Goal: Patent Airway maintained  Outcome: Progressing  Patent  airway maintained : Position patient for maximum ventilatory efficiency;Suction secretions as needed;Reinforce use of ordered respiratory interventions (i.e. CPAP, BiPAP, Incentive Spirometer, Acapella, etc.);Reposition patient every 2 hours and as needed unless able to self-reposition     Problem: Inadequate Airway Clearance  Goal: Normal respiratory rate/effort achieved/maintained  Outcome: Progressing  Normal respiratory rate/effort achieved/maintained: Plan activities to conserve energy: plan rest periods     Problem: Neurological Deficit  Goal: Neurological status is stable or improving  Outcome: Progressing  Neurological status is stable or improving: Monitor/assess/document neurological assessment (Stroke: every 4 hours);Monitor/assess NIH Stroke Scale     Problem: Peripheral Neurovascular Impairment  Goal: Extremity color, movement, sensation are maintained or improved  Outcome: Progressing  Extremity color, movement, sensation are maintained or improved : Increase mobility as tolerated/progressive mobility;Assess and monitor application of corrective devices (cast, brace, splint), check skin integrity     A/O x4. VSS. Afebrile. On RA.   Chest tube to wall suction. Monitor output.   Denied any respiratory distress.   Given Tylenol for pain, explained all available pain med.   Need assist as needed. Fall precaution and explained pt.   Tolerated with meals, denied nausea.   Ambulates well   Incentive spirometry, reached 4000.   CXR in am.   Continue to monitor.

## 2018-04-26 NOTE — Progress Notes (Signed)
Pt will need ML for discharge. Pt wanted to wait  - ML placement - closer discharge date - possible discharge on Thursday.

## 2018-04-27 ENCOUNTER — Inpatient Hospital Stay: Payer: BLUE CROSS/BLUE SHIELD

## 2018-04-27 LAB — ASPERGILLUS ANTIGEN
Aspergillus AG, EIA: NOT DETECTED
Index Aspergillus: 0.04 (ref <0.50)

## 2018-04-27 NOTE — Progress Notes (Signed)
PULMONARY MEDICINE PROGRESS NOTE    Date Time: 04/27/18 11:51 AM  Patient Name: [facility] Day: 12     Attending Physician:  Madilyn Hook, MD      Assessment:     Patient Active Problem List   Diagnosis   . Pilonidal cyst   . Seizure   . Pneumothorax   . GERD (gastroesophageal reflux disease)   . Empyema   . Lung abscess       1. Spontaneous PTX  2. Seizure d/o  3. Abscess/vs. 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 parapneumonic effusion    Recommendation:   Continue abx  Agree with Voriconazole.  ID following  CT to suction per Thoracic Surgery  -110cc       Interval History     Interval History/24 hr. Events:   Review of Systems:   Review of Systems - reviewed and no change from admission    Physical Exam:     Vitals:    04/27/18 1105   BP: 147/89   Pulse: 81   Resp: 16   Temp: 97.3 F (36.3 C)   SpO2: 99%     Temp (24hrs), Avg:97.3 F (36.3 C), Min:96.3 F (35.7 C), Max:98.7 F (37.1 C)      General appearance - alert, well appearing, and in no distress, in bed  Mental status - alert, oriented to person, place, and time  Nose - normal and patent, no erythema, discharge or polyps  Neck - supple, no significant adenopathy  Chest - normal air movement, no wheezing, rhonchi or crackles  Tube tidaling well  Heart - normal rate and regular rhythm  Abdomen - soft, nontender, nondistended, no masses or organomegaly  Neurological - alert, oriented, normal speech, no focal findings or movement disorder noted  Extremities - peripheral pulses normal, no pedal edema, no clubbing or cyanosis    Input/Output:     Intake and Output Summary (Last 24 hours) at Date Time    Intake/Output Summary (Last 24 hours) at 04/27/2018 1151  Last data filed at 04/27/2018 0530  Gross per 24 hour   Intake 200 ml   Output 110 ml   Net 90 ml       Meds:     Current Facility-Administered Medications   Medication Dose Route Frequency   . albuterol  2.5 mg  Nebulization Q6H WA   . budesonide  0.25 mg Nebulization BID   . ertapenem  1,000 mg Intravenous Daily   . levETIRAcetam  500 mg Intravenous Q12H SCH   . lidocaine  1 patch Transdermal Q24H   . melatonin  3 mg Oral QHS   . pantoprazole  40 mg Oral QAM AC   . voriconazole  200 mg Oral Q12H [facility]       Labs:     Recent Labs   Lab 04/23/18  0553 04/21/18  0748   WBC 12.56* 11.81*   RBC 4.57 4.84   Hgb 12.5 13.3   Hematocrit 37.7 39.3   Glucose 140* 89   BUN 10.0 6.0*   Creatinine 0.8 0.8   Calcium 9.2 8.6   Sodium 137 141   Potassium 4.7 3.9   Chloride 105 106   CO2 20* 24         Radiology:     Radiology Results (24 Hour)     Procedure Component Value Units Date/Time    X-ray chest AP portable [  [id number]] Collected:  04/27/18 0829    Order Status:  Completed Updated:  04/27/18 0835    Narrative:       CLINICAL HISTORY: Postoperative evaluation.    Frontal radiograph of the chest:  Comparison:  04/26/2018.    The tip of the left-sided chest tube projects at the level of the left  apex..    The cardiac and mediastinal silhouettes are within normal limits.  There  is no evidence of congestive heart failure, pleural effusion or focal  pneumonia. A left sided pneumothorax is unchanged. The visualized  osseous structures are unremarkable for the patient's stated age.      Impression:          A left-sided pneumothorax is unchanged.     Stephannie Peters, MD   04/27/2018 8:31 AM          Imaging personally reviewed, including:         Signed by: Laurel Dimmer, MD  Please tiger text first   Mobile # [phone]  [facility] [place], [place]  [phone]  [phone] [facility] #1)  [phone] [facility] #2)

## 2018-04-27 NOTE — Plan of Care (Signed)
Patient is A&Ox4. VSS. No c/o n/v/sob. C/o pain to left chest. Lidocaine patch in place.  Pt has chest tube to left lateral chest. Water seal, to wall suction.  Pt ambulates around unit and off unit on suction.  Pt tolerates diet.  IV abx given as ordered.  Safety in place. WCTM.    POC: At least another week here.    Problem: Moderate/High Fall Risk Score >5  Goal: Patient will remain free of falls  Outcome: Progressing  Flowsheets (Taken 04/27/2018 0932)  Moderate Risk (6-13): MOD-Request PT/OT consult order for patients with gait/mobility impairment  High (Greater than 13): LOW-Fall Interventions Appropriate for Low Fall Risk  VH Moderate Risk (6-13): Use of floor mat; Use assistive devices     Problem: Safety  Goal: Patient will be free from injury during hospitalization  Outcome: Progressing  Flowsheets (Taken 04/27/2018 0932)  Patient will be free from injury during hospitalization : Assess patient's risk for falls and implement fall prevention plan of care per policy; Include patient/ family/ care giver in decisions related to safety; Provide and maintain safe environment; Use appropriate transfer methods; Ensure appropriate safety devices are available at the bedside; Hourly rounding; Assess for patients risk for elopement and implement Elopement Risk Plan per policy  Goal: Patient will be free from infection during hospitalization  Outcome: Progressing  Flowsheets (Taken 04/27/2018 0932)  Free from Infection during hospitalization: Assess and monitor for signs and symptoms of infection; Monitor lab/diagnostic results; Encourage patient and family to use good hand hygiene technique     Problem: Discharge Barriers  Goal: Patient will be discharged home or other facility with appropriate resources  Outcome: Progressing  Flowsheets (Taken 04/27/2018 0932)  Discharge to home or other facility with appropriate resources: Provide appropriate patient education; Provide information on available health resources     Problem:  Inadequate Gas Exchange  Goal: Adequate oxygenation and improved ventilation  Outcome: Progressing  Flowsheets (Taken 04/27/2018 0932)  Adequate oxygenation and improved ventilation: Assess lung sounds; Monitor SpO2 and treat as needed; Provide mechanical and oxygen support to facilitate gas exchange; Position for maximum ventilatory efficiency; Teach/reinforce use of incentive spirometer 10 times per hour while awake, cough and deep breath as needed; Plan activities to conserve energy: plan rest periods; Increase activity as tolerated/progressive mobility  Goal: Patent Airway maintained  Outcome: Progressing  Flowsheets (Taken 04/27/2018 0932)  Patent airway maintained : Position patient for maximum ventilatory efficiency; Provide adequate fluid intake to liquefy secretions; Suction secretions as needed; Reposition patient every 2 hours and as needed unless able to self-reposition     Problem: Inadequate Airway Clearance  Goal: Normal respiratory rate/effort achieved/maintained  Outcome: Progressing  Flowsheets (Taken 04/27/2018 0932)  Normal respiratory rate/effort achieved/maintained: Plan activities to conserve energy: plan rest periods     Problem: Neurological Deficit  Goal: Neurological status is stable or improving  Outcome: Progressing  Flowsheets (Taken 04/27/2018 0932)  Neurological status is stable or improving: Monitor/assess/document neurological assessment (Stroke: every 4 hours); Perform CAM Assessment     Problem: Peripheral Neurovascular Impairment  Goal: Extremity color, movement, sensation are maintained or improved  Outcome: Progressing  Flowsheets (Taken 04/27/2018 0932)  Extremity color, movement, sensation are maintained or improved : Increase mobility as tolerated/progressive mobility; Assess and monitor application of corrective devices (cast, brace, splint), check skin integrity; Assess extremity for proper alignment; Teach/review/reinforce ankle pump exercises

## 2018-04-27 NOTE — Progress Notes (Signed)
Thoracic Surgery Daily Progress Note  [facility] - Thoracic Surgery   Team Spectra: [phone] / [phone]    Hospital Day: Hospital Day: 12  Post Operative Day: 5 Days Post-Op   Primary Procedure: Procedure(s) (LRB):  THORACOSCOPY, (VATS), DECORTICATION (Left)     Interval History:   NAEON. Continues to feel well. Ambulating & performing IS well.    Physical Exam:     Vitals:    04/26/18 1101 04/26/18 1515 04/26/18 2000 04/27/18 0532   BP: 116/69 129/62 125/70 105/56   Pulse: 91 82 88 88   Resp: 16 16 16 16    Temp: 97.5 F (36.4 C) (!) 96.3 F (35.7 C) 98.7 F (37.1 C) 97 F (36.1 C)   TempSrc: Oral Oral Oral    SpO2: 98% 100% 99% 99%   Weight:       Height:            PHYSICAL EXAM:  General: AAOx3, NAD  HEENT: Atraumatic, normocephalic, PERRLA  CV: RRR, no murmurs, rubs, gallops  Pulm: CTAB, no wheezes, rhonchi, left CT with 150cc SS fluid, +continuous 1+ air leak    Lines/Drains:   Left chest tube: ~200cc SS output, + airleak continuous 1+    Laboratory and Radiology Results:     Recent Labs   Lab 04/23/18  0553 04/21/18  0748   WBC 12.56* 11.81*   RBC 4.57 4.84   Hgb 12.5 13.3   Hematocrit 37.7 39.3   Sodium 137 141   Potassium 4.7 3.9   Chloride 105 106   CO2 20* 24   BUN 10.0 6.0*   Creatinine 0.8 0.8   Calcium 9.2 8.6   Magnesium 2.2  --    Glucose 140* 89                 No results found.     Assessment:   Craig Phelps is a 23 y.o. male with left lung abscess and empyema s/p pigtail 2/27 with a persistent left hydropneumothorax now 5 Days Post-Op s/p left VATS, decortication. Persistent air leak continues, expected from abscess. +Fungus from pleural cultures. Continues to do well post-operatively.    Plan:   -- continue left chest tube to continuous suction - , even when ambulating  -- continue ertapenem & voriconazole per ID   -- will need Midline on discharge  -- follow up OR cultures and pathology   -- OR cultures w/ mycelial fungus  -- daily morning chest x-ray  -- tylenol & PRN tramadol for pain  control  -- Encourage aggressive pulm toilet with ambulation > 80 laps daily, IS, PEP/PAP therapy, duo nebs    [name], MD  Thoracic Surgery, R4      ** PLEASE DO NOT PAGE - CALL SPECTRA FIRST IF QUESTIONS **    -------------------------------------------------------------------------------------------------------------------

## 2018-04-27 NOTE — Progress Notes (Signed)
ID PROGRESS NOTE      Spectra: [phone]    Office: [phone]    Date Time: 04/27/18 @NOW   Patient Name: Craig Phelps, Craig Phelps    Problem List:      Acute:  Subacute pneumonitis, likely aspiration related as the patient mentions of GERD and frequent alcohol intoxication with vomiting episodes.  Pleural effusion  -- pigtail catheter placed  Lung abscess  -- sputum: mixed resp flora  -- pleural fluid: mod WBC, no growth, 10K WBC, pH- 7.87  Pleural/debris biopsy from 3/04 (updated report from 3/05)   -- many septate hyphae (fungal growth).  Multiple Cx positive    Chronic Conditions:  H/O tobacco use (quit 10 days ago)  H/O vaping 6 months before  Seizure Disorder  GERD  Pilonidal Cyst    Assessment:   Empyema associated with a cavitary lesion. Pleural material now noted to contain hyphae. Pt is doing well clinically. Hx of frequent vomiting, including due to excesses of alcohol. No evidence of immunosuppression. The most likely chain of events is a cavity which formed due to aspiration becoming superinfected with fungal organism and leading to rupture into the pleural space.     Multiple cultures with fungus.  ID pending    Antimicrobials:   abx #12  Ertapenem 1 gram IV daily #8  #4 Voriconazole 200mg  PO BID    Plan:   Continue Ertapenem for possible bacterial component  Continue Voriconazole  ID of the fungus pending  Fungal markers pending  Discussed with pt and mother    Lines:     Patient Lines/Drains/Airways Status    Active PICC Line / CVC Line / PIV Line / Drain / Airway / Intraosseous Line / Epidural Line / ART Line / Line / Wound / Pressure Ulcer / NG/OG Tube     Name:   Placement date:   Placement time:   Site:   Days:    Peripheral IV 04/25/18 Left Antecubital   04/25/18    2146    Antecubital   1    Chest Tube 1 Other (Comment) 28 Fr.   04/22/18    2210    --   4    Wound 04/22/18 Surgical Incision Chest Left DERMABOND   04/22/18    2237    Chest   4               Family History:     Family History   Problem Relation Age of Onset    ADD / ADHD Father     ADD / ADHD Brother     Cancer Paternal Grandfather 37        bladder, smoker    Pulmonary fibrosis Maternal Grandmother         also with ITP    Alzheimer's disease Paternal Grandmother 73       Social History:     Social History     Socioeconomic History    Marital status: Single     Spouse name: Not on file    Number of children: 0    Years of education: Not on file    Highest education level: Not on file   Occupational History    Occupation: Archivist   Social Needs    Financial resource strain: Not on file    Food insecurity:     Worry: Not on file     Inability: Not on file    Transportation needs:     Medical:  Not on file     Non-medical: Not on file   Tobacco Use    Smoking status: Former Smoker    Smokeless tobacco: Never Used    Tobacco comment: quit 1 week ago   Substance and Sexual Activity    Alcohol use: Not Currently     Alcohol/week: 0.0 standard drinks    Drug use: No    Sexual activity: Yes     Partners: Female     Comment: monogamous   Lifestyle    Physical activity:     Days per week: Not on file     Minutes per session: Not on file    Stress: Not on file   Relationships    Social connections:     Talks on phone: Not on file     Gets together: Not on file     Attends religious service: Not on file     Active member of club or organization: Not on file     Attends meetings of clubs or organizations: Not on file     Relationship status: Not on file    Intimate partner violence:     Fear of current or ex partner: Not on file     Emotionally abused: Not on file     Physically abused: Not on file     Forced sexual activity: Not on file   Other Topics Concern    Not on file   Social History Narrative    Taking a break from school    Wanting to transfer for the fall    Working at [employer]        Exercise: no     Diet: nothing special, tries to avoid GERD inducing foods        Allergies:     Allergies   Allergen Reactions    Other      Environmental allergies       Review of Systems:   General ROS: negative for - chills, fevers, night sweats, weight loss   HEENT: negative for - blurry vision, sore throat, thrush   Respiratory ROS: negative for cough, SOB  Cardiovascular ROS: negative for - chest pain, palpitations   Gastrointestinal ROS: negative for - abdominal pain, nausea, vomiting, diarrhea  Genito-Urinary ROS: negative for - dysuria, urinary frequency/urgency   Musculoskeletal ROS: negative for - joint pain, joint stiffness or muscle pain   Dermatological ROS: negative for - rash and skin lesion changes   Neurological ROS: negative for - confusion, headache, dizziness  Hematological ROS: negative for - bruising, bleeding   Psychological ROS: negative for - changes in mood    Physical Exam:     Vitals:    04/27/18 1105   BP: 147/89   Pulse: 81   Resp: 16   Temp: 97.3 F (36.3 C)   SpO2: 99%       General Appearance: alert and appropriate, non-toxic  Neuro: alert, oriented, normal speech, normal attention and cognition  HEENT: no scleral icterus, pupils round and reactive, OP clear  Neck: supple  Lungs: clear to auscultation, no wheezes, rales or rhonchi, left CT with 150cc SS fluid, +continuous 1+ air leak  Cardiac: normal rate, regular rhythm, normal S1, S2,   Abdomen: soft, non-tender, non-distended, normal active bowel sounds  Extremities: no pedal edema  Skin: no rash  Psych: normal mood and affect    Labs:     Lab Results   Component Value Date    WBC 12.56 (  H) 04/23/2018    HGB 12.5 04/23/2018    HCT 37.7 04/23/2018    MCV 82.5 04/23/2018    PLT 542 (H) 04/23/2018     Lab Results   Component Value Date    CREAT 0.8 04/23/2018     Lab Results   Component Value Date    ALT 11 04/16/2018    AST 14 04/16/2018    ALKPHOS 93 04/16/2018    BILITOTAL 0.9 04/16/2018     No results found for: LACTATE    Microbiology:     Microbiology Results     Procedure Component Value Units  Date/Time    AFB Culture & Smear [[id number]] Collected:  04/19/18 1053    Specimen:  Sputum from Pleural Fluid Updated:  04/19/18 1910    Narrative:       ORDER#: [id number]                                    ORDERED BY: Alinda Money  SOURCE: Pleural Fluid left                           COLLECTED:  04/19/18 10:53  ANTIBIOTICS AT COLL.:                                RECEIVED :  04/19/18 15:56  Stain, Acid Fast                           FINAL       04/19/18 19:10  04/19/18   No Acid Fast Bacillus Seen  Culture Acid Fast Bacillus (AFB)           PENDING      AFB culture and smear [[id number]] Collected:  04/22/18 2122    Specimen:  Sputum from Tissue, Deep Updated:  04/23/18 1132    Narrative:       ORDER#: [id number]                                    ORDERED BY: Dorothey Baseman, SANDE  SOURCE: Tissue, Deep LEFT ABCESS CAVITY              COLLECTED:  04/22/18 21:22  ANTIBIOTICS AT COLL.:                                RECEIVED :  04/23/18 02:26  Stain, Acid Fast                           FINAL       04/23/18 11:32  04/23/18   No Acid Fast Bacillus Seen  Culture Acid Fast Bacillus (AFB)           PENDING      AFB culture and smear [[id number]] Collected:  04/22/18 2040    Specimen:  Sputum from Tissue, Deep Updated:  04/23/18 1132    Narrative:       ORDER#: [id number]  ORDERED BY: KHANDHAR, SANDE  SOURCE: Tissue, Deep LEFT PLEURAL WITH DEBRIS        COLLECTED:  04/22/18 20:40  ANTIBIOTICS AT COLL.:                                RECEIVED :  04/23/18 02:30  Stain, Acid Fast                           FINAL       04/23/18 11:32  04/23/18   No Acid Fast Bacillus Seen  Culture Acid Fast Bacillus (AFB)           PENDING      Anaerobic culture [[id number]] Collected:  04/22/18 2040    Specimen:  Other from Tissue Updated:  04/27/18 0729    Narrative:       ORDER#: [id number]                                    ORDERED BY: Dorothey Baseman, SANDE  SOURCE: Tissue LEFT PLEURAL WITH DEBRIS              COLLECTED:   04/22/18 20:40  ANTIBIOTICS AT COLL.:                                RECEIVED :  04/23/18 02:30  Culture, Anaerobic Bacteria                FINAL       04/27/18 07:29  04/27/18   No anaerobic growth      Anaerobic culture [[id number]] Collected:  04/22/18 2122    Specimen:  Other from Abscess Updated:  04/25/18 1144    Narrative:       ORDER#: [id number]                                    ORDERED BY: Dorothey Baseman, SANDE  SOURCE: Abscess LEFT ABCESS CAVITY                   COLLECTED:  04/22/18 21:22  ANTIBIOTICS AT COLL.:                                RECEIVED :  04/23/18 02:26  Culture, Anaerobic Bacteria                PRELIM      04/25/18 11:44  04/25/18   No growth to date, final report to follow      CULTURE + Dierdre Forth [[id number]] Collected:  04/17/18 1532    Specimen:  Sputum, Induced Updated:  04/19/18 1829    Narrative:       ORDER#: [id number]                                    ORDERED BY: Timothy Lasso  SOURCE: Sputum, Induced induce sputum                COLLECTED:  04/17/18 15:32  ANTIBIOTICS AT COLL.:  RECEIVED :  04/17/18 18:14  Stain, Gram (Respiratory)                  FINAL       04/17/18 19:57  04/17/18   Moderate WBC's             Few Squamous epithelial cells             Moderate Mixed Respiratory Flora  Culture and Gram Stain, Aerobic, RespiratorFINAL       04/19/18 18:29  04/19/18   Moderate growth of mixed upper respiratory flora      Culture + Gram Stain,Aerobic, Body Fluid [[id number]] Collected:  04/19/18 1053    Specimen:  Body Fluid from Pleural Fluid Updated:  04/23/18 1525    Narrative:       ORDER#: [id number]                                    ORDERED BY: Alinda Money  SOURCE: Pleural Fluid left                           COLLECTED:  04/19/18 10:53  ANTIBIOTICS AT COLL.:                                RECEIVED :  04/19/18 15:55  Stain, Gram                                FINAL       04/19/18 18:12  04/19/18   Moderate WBCs             No  organisms seen  Culture and Gram Stain, Aerobic, Body FluidFINAL       04/23/18 15:25  04/23/18   No growth      Fungus culture [[id number]] Collected:  04/22/18 2122    Specimen:  Other from Tissue Updated:  04/23/18 1130    Narrative:       ORDER#: [id number]                                    ORDERED BY: Dorothey Baseman, SANDE  SOURCE: Tissue LEFT ABCESS CAVITY                    COLLECTED:  04/22/18 21:22  ANTIBIOTICS AT COLL.:                                RECEIVED :  04/23/18 02:26  Stain, Fungal                              FINAL       04/23/18 11:29  04/23/18   No Fungal or Yeast Elements Seen  Culture Fungus                             PENDING      Fungus culture [[id number]] Collected:  04/22/18 2040    Specimen:  Other from Biopsy Updated:  04/23/18 1126    Narrative:  [id number] called Micro Results of pos fungal stain. Results read back [id number],  by [id number] on 04/23/2018 at 11:26  ORDER#: [id number]                                    ORDERED BY: [email or web address], SANDE  SOURCE: Biopsy LEFT PLEURAL WITH DEBRIS              COLLECTED:  04/22/18 20:40  ANTIBIOTICS AT COLL.:                                RECEIVED :  04/23/18 02:30  [id number] called Micro Results of pos fungal stain. Results read back [id number], by [id number] on 04/23/2018 at 11:26  Stain, Fungal                              FINAL       04/23/18 11:26   +  04/23/18   Many Septate Hyphae Seen  Culture Fungus                             PENDING      Legionella antigen, urine [[id number]] Collected:  04/20/18 2004    Specimen:  Urine, Catheterized, Foley Updated:  04/21/18 0403    Narrative:       ORDER#: [id number]                                    ORDERED BY: Geraldine Solar  SOURCE: Urine, Catheterized, Foley                   COLLECTED:  04/20/18 20:04  ANTIBIOTICS AT COLL.:                                RECEIVED :  04/20/18 23:45  Legionella, Rapid Urinary Antigen          FINAL       04/21/18 04:03  04/21/18   Negative for Legionella pneumophila Serogroup 1  Antigen             Limitations of Test:             1. Negative results do not exclude infection with Legionella                pneumophila Serogroup 1.             2. Does not detect other serogroups of L. pneumophila                or other Legionella species.             Test Reference Range: Negative      Presurgical Surveillance, MSSA+MRSA [[id number]] Collected:  04/20/18 1630    Specimen:  Nares Updated:  04/21/18 1619     Culture Staph and MRSA Surveillance --     No Staph aureus isolated and  No Methicillin Resistant Staph aureus isolated      Narrative:       Please swab nares and throat    Presurgical Surveillance, MSSA+MRSA [[id number]] Collected:  04/20/18 1630    Specimen:  Throat Updated:  04/21/18 1619     Culture Staph and MRSA Surveillance --     No Staph aureus isolated and  No Methicillin Resistant Staph aureus isolated      S. PNEUMONIAE, RAPID URINARY ANTIGEN [[id number]] Collected:  04/20/18 2004    Specimen:  Urine, Clean Catch Updated:  04/21/18 0402    Narrative:       ORDER#: [id number]                                    ORDERED BY: Geraldine Solar  SOURCE: Urine, Clean Catch                           COLLECTED:  04/20/18 20:04  ANTIBIOTICS AT COLL.:                                RECEIVED :  04/20/18 23:45  S. pneumoniae, Rapid Urinary Antigen       FINAL       04/21/18 04:02  04/21/18   Negative for Streptococcus pneumoniae Urinary Antigen             Note:             This is a presumptive test for the direct qualitative             detection of bacterial antigen. This test is not intended as             a substitute for a gram stain and bacterial culture. Samples             with extremely low levels of antigen may yield negative             results.             Reference Range: Negative      Wound culture & gram stain [[id number]] Collected:  04/22/18 2122    Specimen:  Wound from Abscess Updated:  04/26/18 0814    Narrative:       ORDER#: [id number]                                     ORDERED BY: Dorothey Baseman, SANDE  SOURCE: Abscess LEFT ABCESS CAVITY                   COLLECTED:  04/22/18 21:22  ANTIBIOTICS AT COLL.:                                RECEIVED :  04/23/18 02:26  Stain, Gram                                FINAL       04/23/18 03:42  04/23/18   No Squamous epithelial cells seen             Moderate WBCs             No organisms seen  Culture and Gram Stain, Aerobic, Wound     PRELIM      04/26/18 08:14   +  04/24/18   Culture no growth to date, Final report  to follow  04/25/18   Light growth of Mycelial Fungus               Refer to Identification on culture #[id number]        Wound culture & gram stain [[id number]] Collected:  04/22/18 2040    Specimen:  Wound Updated:  04/26/18 0814    Narrative:       ORDER#: [id number]                                    ORDERED BY: Dorothey Baseman, SANDE  SOURCE: Wound LEFT PLEURAL WITH DEBRIS               COLLECTED:  04/22/18 20:40  ANTIBIOTICS AT COLL.:                                RECEIVED :  04/23/18 02:30  Stain, Gram                                FINAL       04/23/18 05:06  04/23/18   No Squamous epithelial cells seen             Many WBCs             No organisms seen  Culture and Gram Stain, Aerobic, Wound     PRELIM      04/26/18 08:14   +  04/24/18   Culture no growth to date, Final report to follow  04/25/18   Moderate growth of Mycelial Fungus               Identification to follow              Rads:   X-ray Chest Ap Portable    Result Date: 04/27/2018   A left-sided pneumothorax is unchanged.  Stephannie Peters, MD 04/27/2018 8:31 AM      Signed by: Despina Hick, MD, FACS  (Surgical Critical Care Fellow rotating in ID)

## 2018-04-28 ENCOUNTER — Inpatient Hospital Stay: Payer: BLUE CROSS/BLUE SHIELD

## 2018-04-28 LAB — CBC
Absolute NRBC: 0 10*3/uL (ref 0.00–0.00)
Hematocrit: 38.2 % (ref 37.6–49.6)
Hgb: 12.3 g/dL — ABNORMAL LOW (ref 12.5–17.1)
MCH: 26.2 pg (ref 25.1–33.5)
MCHC: 32.2 g/dL (ref 31.5–35.8)
MCV: 81.3 fL (ref 78.0–96.0)
MPV: 9.1 fL (ref 8.9–12.5)
Nucleated RBC: 0 /100 WBC (ref 0.0–0.0)
Platelets: 659 10*3/uL — ABNORMAL HIGH (ref 142–346)
RBC: 4.7 10*6/uL (ref 4.20–5.90)
RDW: 13 % (ref 11–15)
WBC: 11.77 10*3/uL — ABNORMAL HIGH (ref 3.10–9.50)

## 2018-04-28 LAB — BASIC METABOLIC PANEL
BUN: 13 mg/dL (ref 9.0–28.0)
CO2: 23 mEq/L (ref 22–29)
Calcium: 9.2 mg/dL (ref 8.5–10.5)
Chloride: 104 mEq/L (ref 100–111)
Creatinine: 0.8 mg/dL (ref 0.7–1.3)
Glucose: 81 mg/dL (ref 70–100)
Potassium: 4.5 mEq/L (ref 3.5–5.1)
Sodium: 139 mEq/L (ref 136–145)

## 2018-04-28 LAB — MAGNESIUM: Magnesium: 1.9 mg/dL (ref 1.6–2.6)

## 2018-04-28 LAB — GFR: EGFR: >60

## 2018-04-28 NOTE — Plan of Care (Addendum)
Problem: Moderate/High Fall Risk Score >5  Goal: Patient will remain free of falls  Outcome: Progressing  Flowsheets (Taken 04/27/2018 1922)  High (Greater than 13): LOW-Fall Interventions Appropriate for Low Fall Risk     Problem: Safety  Goal: Patient will be free from injury during hospitalization  Outcome: Progressing  Flowsheets (Taken 04/28/2018 0139)  Patient will be free from injury during hospitalization : Assess patient's risk for falls and implement fall prevention plan of care per policy; Provide and maintain safe environment; Use appropriate transfer methods; Ensure appropriate safety devices are available at the bedside; Include patient/ family/ care giver in decisions related to safety; Hourly rounding; Assess for patients risk for elopement and implement Elopement Risk Plan per policy  Goal: Patient will be free from infection during hospitalization  Outcome: Progressing  Flowsheets (Taken 04/28/2018 0139)  Free from Infection during hospitalization: Monitor lab/diagnostic results; Assess and monitor for signs and symptoms of infection; Monitor all insertion sites (i.e. indwelling lines, tubes, urinary catheters, and drains); Encourage patient and family to use good hand hygiene technique     Problem: Discharge Barriers  Goal: Patient will be discharged home or other facility with appropriate resources  Outcome: Progressing  Flowsheets (Taken 04/28/2018 0139)  Discharge to home or other facility with appropriate resources: Initiate discharge planning     Problem: Inadequate Gas Exchange  Goal: Adequate oxygenation and improved ventilation  Outcome: Progressing  Flowsheets (Taken 04/28/2018 0139)  Adequate oxygenation and improved ventilation: Assess lung sounds; Monitor SpO2 and treat as needed; Plan activities to conserve energy: plan rest periods; Increase activity as tolerated/progressive mobility  Goal: Patent Airway maintained  Outcome: Progressing  Flowsheets (Taken 04/28/2018 0139)  Patent airway  maintained : Position patient for maximum ventilatory efficiency; Suction secretions as needed; Provide adequate fluid intake to liquefy secretions     Problem: Inadequate Airway Clearance  Goal: Normal respiratory rate/effort achieved/maintained  Outcome: Progressing  Flowsheets (Taken 04/28/2018 0139)  Normal respiratory rate/effort achieved/maintained: Plan activities to conserve energy: plan rest periods     Problem: Neurological Deficit  Goal: Neurological status is stable or improving  Outcome: Progressing  Flowsheets (Taken 04/28/2018 0139)  Neurological status is stable or improving: Perform CAM Assessment     Problem: Peripheral Neurovascular Impairment  Goal: Extremity color, movement, sensation are maintained or improved  Outcome: Progressing  Flowsheets (Taken 04/28/2018 0139)  Extremity color, movement, sensation are maintained or improved : Increase mobility as tolerated/progressive mobility; VTE Prevention: Administer anticoagulant(s) and/or apply anti-embolism stockings/devices as ordered; Assess extremity for proper alignment       Pt is A&Ox4. RA. With minimal to moderate complaints of left chest pain at CT site area which increases with movement treated with prn Tramadol and Tylenol with some pain relief.   CT attached to Continuous wall suction on -20cm H2O, dressing at the site is c/d/I, x1 dressing changed.  Atrium noted with continuous Bubbling MD aware.   No bleeding noted on CT site.   MD Ammie Ferrier Rounded with the pt last night, no new orders WCTM.  Pt was able to ambulated total of 75 laps yesterday.   Pt was able to perform on his IS reach at 4000  VSS. Pt refused his night dose Keppra.   Pt also does not want to be disturbed 11pm to 7am, also wants his labs to be done after 7am.    Suction set up at the bedside.           for Chest X-Ray In the  morning.           Regular Diet tolerated well.           Denies any SOB, N/V.          High Fall risks interventions were implemented, bed at the  lowest    position, call bell within reach fall mats in placed, side rails up 3/4.   Rounding with the pt. Will continue to monitor the pt for any changes. Needs attended.

## 2018-04-28 NOTE — Plan of Care (Signed)
Problem: Moderate/High Fall Risk Score >5  Goal: Patient will remain free of falls  Outcome: Progressing  Flowsheets (Taken 04/27/2018 1922 by Fabian Sharp, RN)  Moderate Risk (6-13): LOW-Fall Interventions Appropriate for Low Fall Risk     Problem: Safety  Goal: Patient will be free from injury during hospitalization  Outcome: Progressing  Flowsheets (Taken 04/28/2018 1604)  Patient will be free from injury during hospitalization : Assess patient's risk for falls and implement fall prevention plan of care per policy; Provide and maintain safe environment; Use appropriate transfer methods; Include patient/ family/ care giver in decisions related to safety; Ensure appropriate safety devices are available at the bedside; Assess for patients risk for elopement and implement Elopement Risk Plan per policy; Hourly rounding  Goal: Patient will be free from infection during hospitalization  Outcome: Progressing  Flowsheets (Taken 04/28/2018 1604)  Free from Infection during hospitalization: Assess and monitor for signs and symptoms of infection; Monitor lab/diagnostic results; Monitor all insertion sites (i.e. indwelling lines, tubes, urinary catheters, and drains); Encourage patient and family to use good hand hygiene technique     Problem: Discharge Barriers  Goal: Patient will be discharged home or other facility with appropriate resources  Outcome: Progressing  Flowsheets (Taken 04/28/2018 1604)  Discharge to home or other facility with appropriate resources: Provide appropriate patient education; Provide information on available health resources; Initiate discharge planning     Problem: Inadequate Gas Exchange  Goal: Adequate oxygenation and improved ventilation  Outcome: Progressing  Flowsheets (Taken 04/28/2018 1604)  Adequate oxygenation and improved ventilation: Assess lung sounds; Monitor SpO2 and treat as needed; Position for maximum ventilatory efficiency; Plan activities to conserve energy: plan rest  periods; Increase activity as tolerated/progressive mobility; Consult/collaborate with Respiratory Therapy  Goal: Patent Airway maintained  Outcome: Progressing  Flowsheets (Taken 04/28/2018 1604)  Patent airway maintained : Position patient for maximum ventilatory efficiency; Provide adequate fluid intake to liquefy secretions     Problem: Inadequate Airway Clearance  Goal: Normal respiratory rate/effort achieved/maintained  Outcome: Progressing  Flowsheets (Taken 04/28/2018 1604)  Normal respiratory rate/effort achieved/maintained: Plan activities to conserve energy: plan rest periods     Problem: Neurological Deficit  Goal: Neurological status is stable or improving  Outcome: Progressing  Flowsheets (Taken 04/28/2018 1604)  Neurological status is stable or improving: Observe for seizure activity and initiate seizure precautions if indicated; Perform CAM Assessment     Problem: Peripheral Neurovascular Impairment  Goal: Extremity color, movement, sensation are maintained or improved  Outcome: Progressing  Flowsheets (Taken 04/28/2018 1604)  Extremity color, movement, sensation are maintained or improved : Increase mobility as tolerated/progressive mobility     Pt A/Ox4, c/o pain around chest incision and chest tube insertion site - admin tylenol w/ relief  No c/o n/v, SOB, CP, or HA  Medications admin per order  Family at the bedside  Tolerating diet, ambulates well independently - pt has off unit privileges for no longer than 30 min at a time  Chest tube to lateral left chest, -20 cm H2O, continuous suction, dressing c/d/I  Lidocaine patch to lateral left back  MD aware of continuous bubbling in atrium  Will continue to monitor

## 2018-04-28 NOTE — Student Consult (Signed)
*This not is for educational purpose only and not for clinical use*    Medical student Pulmonary Progress Note      Date Time: 04/28/18 9:44 AM  Patient Name: Craig Phelps  Attending Physician: Madilyn Hook, MD      Assessment:   23 year old male with hx of seizure disorder who presented with abscess/vs. Cavitary pneumonia, pleural effusion and spontaneous pneumothorax who is s/p VATS decortication on 04/22/18. Patient continues to have air leaks, but does not have s&s of hemodynamic compromise, and is improving on antibiotics.     Plan:     -- continue antibiotics as per ID  -- Chest tube on suction and management per primary team    Interval History     Interval History/24 hr. Events:     Patient continues to feel well, denies sob, coughing, fevers, chills.  CT drained 87cc overnight. Continues to have air leaks.   CXR this AM stable from yesterday.     ROS: unchanged from admission, except for that documented in current note.      Physical Exam:    Temp (24hrs), Avg:97.7 F (36.5 C), Min:97.1 F (36.2 C), Max:98.2 F (36.8 C)       Vitals:    04/28/18 0818   BP: 136/73   Pulse: 84   Resp: 17   Temp: 97.7 F (36.5 C)   SpO2: 97%        Intake and Output Summary (Last 24 hours) at Date Time    Intake/Output Summary (Last 24 hours) at 04/28/2018 0944  Last data filed at 04/28/2018 0600  Gross per 24 hour   Intake 900 ml   Output 87 ml   Net 813 ml       General: awake, alert, oriented x 3; no acute distress.  Cardiovascular: regular rate and rhythm, no murmurs, rubs or gallops  Lungs: clear to auscultation bilaterally, without wheezing, rhonchi, or rales  Abdomen: soft, non-tender, non-distended; no palpable masses, no hepatosplenomegaly, normoactive bowel sounds, no rebound or guarding  Extremities: no clubbing, cyanosis, or edema      Meds:   Scheduled Meds:  Current Facility-Administered Medications   Medication Dose Route Frequency    albuterol  2.5 mg Nebulization Q6H WA    budesonide  0.25 mg  Nebulization BID    ertapenem  1,000 mg Intravenous Daily    levETIRAcetam  500 mg Intravenous Q12H SCH    lidocaine  1 patch Transdermal Q24H    melatonin  3 mg Oral QHS    pantoprazole  40 mg Oral QAM AC    voriconazole  200 mg Oral Q12H SCH     Continuous Infusions:  PRN Meds:.acetaminophen, benzonatate, naloxone, ondansetron **OR** ondansetron, traMADol    Medications reviewed:    Labs:     Recent Labs   Lab 04/28/18  0815 04/23/18  0553   WBC 11.77* 12.56*   RBC 4.70 4.57   Hgb 12.3* 12.5   Hematocrit 38.2 37.7   Platelets 659* 542*   Glucose 81 140*   BUN 13.0 10.0   Creatinine 0.8 0.8   Calcium 9.2 9.2   Sodium 139 137   Potassium 4.5 4.7   Chloride 104 105   CO2 23 20*       Radiology:     Radiology Results (24 Hour)     Procedure Component Value Units Date/Time    X-ray chest AP portable [[id number]] Collected:  04/28/18 0650    Order Status:  Completed Updated:  04/28/18 6045    Narrative:       HISTORY: Thoracic surgery.    Portable image of the chest show heart size and the vascular pattern to  be normal. Mediastinal hilar contours are normal. Left thoracostomy tube  tip is in the left apex. There is a small left pneumothorax unchanged  when compared to 04/27/2018. Small left effusion.      Impression:         1. Stable left pneumothorax and small left effusion.    Olen Pel, MD   04/28/2018 6:51 AM          Imaging personally reviewed.        Signed by: Seward Grater, MD  [facility]    [phone] (Office)  [phone] [facility] #1)  [phone] [facility] #2)

## 2018-04-28 NOTE — Progress Notes (Signed)
ID PROGRESS NOTE      Spectra: [phone]    Office: [phone]    Date Time: 04/28/18 @NOW   Patient Name: Craig Phelps, Craig Phelps    Problem List:      Acute:  Subacute pneumonitis, likely aspiration related as the patient mentions of GERD and frequent alcohol intoxication with vomiting episodes.  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 Pleural material now noted to contain hyphae. Pt is doing well clinically. Hx of frequent vomiting, including due to excesses of alcohol. No evidence of immunosuppression. The most likely chain of events is a cavity which formed due to aspiration becoming superinfected with fungal organism and leading to rupture into the pleural space.    Multiple cultures with fungus. ID pending    Antimicrobials:   abx #13  #9 Ertapenem 1 gram IV daily  #5 Voriconazole 200mg  PO BID    Plan:     Continue Ertapenem for possible bacterial component  Continue Voriconazole  Will check LFTs with tomorrow am labs  ID of the fungus pending  Fungal markers pending (Aspergillus Ag negative, Hisptoplasma Galactomannan Ag pending)  Discussed with pt and mother      Lines:     Patient Lines/Drains/Airways Status    Active PICC Line / CVC Line / PIV Line / Drain / Airway / Intraosseous Line / Epidural Line / ART Line / Line / Wound / Pressure Ulcer / NG/OG Tube     Name:   Placement date:   Placement time:   Site:   Days:    Peripheral IV 04/25/18 Left Antecubital   04/25/18    2146    Antecubital   2    Chest Tube 1 Other (Comment)  28 Fr.   04/22/18    2210    --   5    Wound 04/22/18 Surgical Incision Chest Left DERMABOND   04/22/18    2237    Chest   5                Family History:     Family History   Problem Relation Age of Onset    ADD / ADHD Father     ADD / ADHD Brother     Cancer Paternal Grandfather 4        bladder, smoker    Pulmonary fibrosis Maternal Grandmother         also with ITP    Alzheimer's disease Paternal Grandmother 72       Social History:     Social History     Socioeconomic History    Marital status: Single     Spouse name: Not on file    Number of children: 0    Years of education: Not on file    Highest education level: Not on file   Occupational History    Occupation: Archivist   Social Needs    Financial resource strain: Not on file    Food insecurity:     Worry:  Not on file     Inability: Not on file    Transportation needs:     Medical: Not on file     Non-medical: Not on file   Tobacco Use    Smoking status: Former Smoker    Smokeless tobacco: Never Used    Tobacco comment: quit 1 week ago   Substance and Sexual Activity    Alcohol use: Not Currently     Alcohol/week: 0.0 standard drinks    Drug use: No    Sexual activity: Yes     Partners: Female     Comment: monogamous   Lifestyle    Physical activity:     Days per week: Not on file     Minutes per session: Not on file    Stress: Not on file   Relationships    Social connections:     Talks on phone: Not on file     Gets together: Not on file     Attends religious service: Not on file     Active member of club or organization: Not on file     Attends meetings of clubs or organizations: Not on file     Relationship status: Not on file    Intimate partner violence:     Fear of current or ex partner: Not on file     Emotionally abused: Not on file     Physically abused: Not on file     Forced sexual activity: Not on file   Other Topics Concern    Not on file   Social History Narrative    Taking a break from school    Wanting to  transfer for the fall    Working at [employer]        Exercise: no     Diet: nothing special, tries to avoid GERD inducing foods       Allergies:     Allergies   Allergen Reactions    Other      Environmental allergies       Review of Systems:   General ROS: negative for - chills, fevers, night sweats, weight loss   HEENT: negative for - blurry vision, sore throat, thrush   Respiratory ROS: negative for cough, SOB  Cardiovascular ROS: negative for - chest pain, palpitations   Gastrointestinal ROS: negative for - abdominal pain, nausea, vomiting, diarrhea  Genito-Urinary ROS: negative for - dysuria, urinary frequency/urgency   Musculoskeletal ROS: negative for - joint pain, joint stiffness or muscle pain   Dermatological ROS: negative for - rash and skin lesion changes   Neurological ROS: negative for - confusion, headache, dizziness  Hematological ROS: negative for - bruising, bleeding   Psychological ROS: negative for - changes in mood    Physical Exam:     Vitals:    04/28/18 0818   BP: 136/73   Pulse: 84   Resp: 17   Temp: 97.7 F (36.5 C)   SpO2: 97%       General Appearance: alert and appropriate, non-toxic  Neuro: alert, oriented, normal speech, normal attention and cognition  HEENT: no scleral icterus, pupils round and reactive, OP clear  Neck: supple  Lungs: clear to auscultation, no wheezes, rales or rhonchi, left CT with 87cc SS fluid, +continuous 1+ air leak  Cardiac: normal rate, regular rhythm, normal S1, S2,   Abdomen: soft, non-tender, non-distended, normal active bowel sounds  Extremities: no pedal edema  Skin: no rash  Psych: normal mood and  affect    Labs:     Lab Results   Component Value Date    WBC 11.77 (H) 04/28/2018    HGB 12.3 (L) 04/28/2018    HCT 38.2 04/28/2018    MCV 81.3 04/28/2018    PLT 659 (H) 04/28/2018     Lab Results   Component Value Date    CREAT 0.8 04/28/2018     Lab Results   Component Value Date    ALT 11 04/16/2018    AST 14 04/16/2018    ALKPHOS 93 04/16/2018     BILITOTAL 0.9 04/16/2018     No results found for: LACTATE    Microbiology:     Microbiology Results     Procedure Component Value Units Date/Time    AFB Culture & Smear [[id number]] Collected:  04/19/18 1053    Specimen:  Sputum from Pleural Fluid Updated:  04/27/18 2006    Narrative:       ORDER#: [id number]                                    ORDERED BY: Alinda Money  SOURCE: Pleural Fluid left                           COLLECTED:  04/19/18 10:53  ANTIBIOTICS AT COLL.:                                RECEIVED :  04/19/18 15:56  Stain, Acid Fast                           FINAL       04/19/18 19:10  04/19/18   No Acid Fast Bacillus Seen  Culture Acid Fast Bacillus (AFB)           PRELIM      04/27/18 20:04  04/27/18   No growth after 1 week/s of incubation.      AFB culture and smear [[id number]] Collected:  04/22/18 2122    Specimen:  Sputum from Tissue, Deep Updated:  04/23/18 1132    Narrative:       ORDER#: [id number]                                    ORDERED BY: Dorothey Baseman, SANDE  SOURCE: Tissue, Deep LEFT ABCESS CAVITY              COLLECTED:  04/22/18 21:22  ANTIBIOTICS AT COLL.:                                RECEIVED :  04/23/18 02:26  Stain, Acid Fast                           FINAL       04/23/18 11:32  04/23/18   No Acid Fast Bacillus Seen  Culture Acid Fast Bacillus (AFB)           PENDING      AFB culture and smear [[id number]] Collected:  04/22/18 2040    Specimen:  Sputum from Tissue, Deep Updated:  04/23/18 1132  Narrative:       ORDER#: [id number]                                    ORDERED BY: Dorothey Baseman, SANDE  SOURCE: Tissue, Deep LEFT PLEURAL WITH DEBRIS        COLLECTED:  04/22/18 20:40  ANTIBIOTICS AT COLL.:                                RECEIVED :  04/23/18 02:30  Stain, Acid Fast                           FINAL       04/23/18 11:32  04/23/18   No Acid Fast Bacillus Seen  Culture Acid Fast Bacillus (AFB)           PENDING      Anaerobic culture [[id number]] Collected:  04/22/18 2040    Specimen:   Other from Tissue Updated:  04/27/18 0729    Narrative:       ORDER#: [id number]                                    ORDERED BY: Dorothey Baseman, SANDE  SOURCE: Tissue LEFT PLEURAL WITH DEBRIS              COLLECTED:  04/22/18 20:40  ANTIBIOTICS AT COLL.:                                RECEIVED :  04/23/18 02:30  Culture, Anaerobic Bacteria                FINAL       04/27/18 07:29  04/27/18   No anaerobic growth      Anaerobic culture [[id number]] Collected:  04/22/18 2122    Specimen:  Other from Abscess Updated:  04/25/18 1144    Narrative:       ORDER#: [id number]                                    ORDERED BY: Dorothey Baseman, SANDE  SOURCE: Abscess LEFT ABCESS CAVITY                   COLLECTED:  04/22/18 21:22  ANTIBIOTICS AT COLL.:                                RECEIVED :  04/23/18 02:26  Culture, Anaerobic Bacteria                PRELIM      04/25/18 11:44  04/25/18   No growth to date, final report to follow      CULTURE + Dierdre Forth [[id number]] Collected:  04/17/18 1532    Specimen:  Sputum, Induced Updated:  04/19/18 1829    Narrative:       ORDER#: [id number]  ORDERED BY: BLOOM, ROBERT  SOURCE: Sputum, Induced induce sputum                COLLECTED:  04/17/18 15:32  ANTIBIOTICS AT COLL.:                                RECEIVED :  04/17/18 18:14  Stain, Gram (Respiratory)                  FINAL       04/17/18 19:57  04/17/18   Moderate WBC's             Few Squamous epithelial cells             Moderate Mixed Respiratory Flora  Culture and Gram Stain, Aerobic, RespiratorFINAL       04/19/18 18:29  04/19/18   Moderate growth of mixed upper respiratory flora      Culture + Gram Stain,Aerobic, Body Fluid [[id number]] Collected:  04/19/18 1053    Specimen:  Body Fluid from Pleural Fluid Updated:  04/23/18 1525    Narrative:       ORDER#: [id number]                                    ORDERED BY: Alinda Money  SOURCE: Pleural Fluid left                           COLLECTED:   04/19/18 10:53  ANTIBIOTICS AT COLL.:                                RECEIVED :  04/19/18 15:55  Stain, Gram                                FINAL       04/19/18 18:12  04/19/18   Moderate WBCs             No organisms seen  Culture and Gram Stain, Aerobic, Body FluidFINAL       04/23/18 15:25  04/23/18   No growth      Fungus culture [[id number]] Collected:  04/22/18 2122    Specimen:  Other from Tissue Updated:  04/23/18 1130    Narrative:       ORDER#: [id number]                                    ORDERED BY: Dorothey Baseman, SANDE  SOURCE: Tissue LEFT ABCESS CAVITY                    COLLECTED:  04/22/18 21:22  ANTIBIOTICS AT COLL.:                                RECEIVED :  04/23/18 02:26  Stain, Fungal                              FINAL       04/23/18 11:29  04/23/18   No Fungal or Yeast Elements  Seen  Culture Fungus                             PENDING      Fungus culture [[id number]] Collected:  04/22/18 2040    Specimen:  Other from Biopsy Updated:  04/23/18 1126    Narrative:       [id number] called Micro Results of pos fungal stain. Results read back [id number],  by [id number] on 04/23/2018 at 11:26  ORDER#: [id number]                                    ORDERED BY: HQIONGEX, SANDE  SOURCE: Biopsy LEFT PLEURAL WITH DEBRIS              COLLECTED:  04/22/18 20:40  ANTIBIOTICS AT COLL.:                                RECEIVED :  04/23/18 02:30  [id number] called Micro Results of pos fungal stain. Results read back [id number], by [id number] on 04/23/2018 at 11:26  Stain, Fungal                              FINAL       04/23/18 11:26   +  04/23/18   Many Septate Hyphae Seen  Culture Fungus                             PENDING      Legionella antigen, urine [[id number]] Collected:  04/20/18 2004    Specimen:  Urine, Catheterized, Foley Updated:  04/21/18 0403    Narrative:       ORDER#: [id number]                                    ORDERED BY: Geraldine Solar  SOURCE: Urine, Catheterized, Foley                   COLLECTED:  04/20/18  20:04  ANTIBIOTICS AT COLL.:                                RECEIVED :  04/20/18 23:45  Legionella, Rapid Urinary Antigen          FINAL       04/21/18 04:03  04/21/18   Negative for Legionella pneumophila Serogroup 1 Antigen             Limitations of Test:             1. Negative results do not exclude infection with Legionella                pneumophila Serogroup 1.             2. Does not detect other serogroups of L. pneumophila                or other Legionella species.             Test Reference Range: Negative      Presurgical Surveillance, [place] [[id number]] Collected:  04/20/18 1630    Specimen:  Nares Updated:  04/21/18 1619     Culture Staph and MRSA Surveillance --     No Staph aureus isolated and  No Methicillin Resistant Staph aureus isolated      Narrative:       Please swab nares and throat    Presurgical Surveillance, MSSA+MRSA [[id number]] Collected:  04/20/18 1630    Specimen:  Throat Updated:  04/21/18 1619     Culture Staph and MRSA Surveillance --     No Staph aureus isolated and  No Methicillin Resistant Staph aureus isolated      S. PNEUMONIAE, RAPID URINARY ANTIGEN [[id number]] Collected:  04/20/18 2004    Specimen:  Urine, Clean Catch Updated:  04/21/18 0402    Narrative:       ORDER#: [id number]                                    ORDERED BY: Geraldine Solar  SOURCE: Urine, Clean Catch                           COLLECTED:  04/20/18 20:04  ANTIBIOTICS AT COLL.:                                RECEIVED :  04/20/18 23:45  S. pneumoniae, Rapid Urinary Antigen       FINAL       04/21/18 04:02  04/21/18   Negative for Streptococcus pneumoniae Urinary Antigen             Note:             This is a presumptive test for the direct qualitative             detection of bacterial antigen. This test is not intended as             a substitute for a gram stain and bacterial culture. Samples             with extremely low levels of antigen may yield negative             results.             Reference  Range: Negative      Wound culture & gram stain [[id number]] Collected:  04/22/18 2122    Specimen:  Wound from Abscess Updated:  04/27/18 1226    Narrative:       ORDER#: [id number]                                    ORDERED BY: Dorothey Baseman, SANDE  SOURCE: Abscess LEFT ABCESS CAVITY                   COLLECTED:  04/22/18 21:22  ANTIBIOTICS AT COLL.:                                RECEIVED :  04/23/18 02:26  Stain, Gram                                FINAL       04/23/18 03:42  04/23/18  No Squamous epithelial cells seen             Moderate WBCs             No organisms seen  Culture and Gram Stain, Aerobic, Wound     PRELIM      04/27/18 12:26   +  04/24/18   Culture no growth to date, Final report to follow  04/25/18   Light growth of Mycelial Fungus               Refer to Identification on culture #[id number]        Wound culture & gram stain [[id number]] Collected:  04/22/18 2040    Specimen:  Wound Updated:  04/27/18 1226    Narrative:       ORDER#: [id number]                                    ORDERED BY: Dorothey Baseman, SANDE  SOURCE: Wound LEFT PLEURAL WITH DEBRIS               COLLECTED:  04/22/18 20:40  ANTIBIOTICS AT COLL.:                                RECEIVED :  04/23/18 02:30  Stain, Gram                                FINAL       04/23/18 05:06  04/23/18   No Squamous epithelial cells seen             Many WBCs             No organisms seen  Culture and Gram Stain, Aerobic, Wound     PRELIM      04/27/18 12:26   +  04/24/18   Culture no growth to date, Final report to follow  04/25/18   Moderate growth of Mycelial Fungus               Identification to follow              Rads:   X-ray Chest Ap Portable    Result Date: 04/28/2018  1. Stable left pneumothorax and small left effusion. Olen Pel, MD 04/28/2018 6:51 AM      Signed by: Despina Hick, MD, FACS  (Surgical Critical Care Fellow rotating in ID)

## 2018-04-28 NOTE — Plan of Care (Signed)
Problem: Moderate/High Fall Risk Score >5  Goal: Patient will remain free of falls  Outcome: Progressing  Flowsheets (Taken 04/28/2018 2143)  VH High Risk (Greater than 13): BED ALARM WILL BE ACTIVATED WHEN THE PATEINT IS IN BED WITH SIGNAGE "RESET BED ALARM"; Use assistive devices; Use of floor mat; Keep door open for better visibility     Problem: Safety  Goal: Patient will be free from injury during hospitalization  Outcome: Progressing  Flowsheets (Taken 04/28/2018 2143)  Patient will be free from injury during hospitalization : Assess patient's risk for falls and implement fall prevention plan of care per policy; Provide and maintain safe environment; Use appropriate transfer methods; Hourly rounding; Include patient/ family/ care giver in decisions related to safety; Ensure appropriate safety devices are available at the bedside; Assess for patients risk for elopement and implement Elopement Risk Plan per policy  Goal: Patient will be free from infection during hospitalization  Outcome: Progressing  Flowsheets (Taken 04/28/2018 2143)  Free from Infection during hospitalization: Assess and monitor for signs and symptoms of infection; Monitor lab/diagnostic results; Monitor all insertion sites (i.e. indwelling lines, tubes, urinary catheters, and drains); Encourage patient and family to use good hand hygiene technique     Problem: Discharge Barriers  Goal: Patient will be discharged home or other facility with appropriate resources  Outcome: Progressing  Flowsheets (Taken 04/28/2018 2143)  Discharge to home or other facility with appropriate resources: Initiate discharge planning     Problem: Inadequate Gas Exchange  Goal: Adequate oxygenation and improved ventilation  Outcome: Progressing  Flowsheets (Taken 04/28/2018 2143)  Adequate oxygenation and improved ventilation: Assess lung sounds; Monitor SpO2 and treat as needed; Position for maximum ventilatory efficiency; Teach/reinforce use of incentive spirometer 10  times per hour while awake, cough and deep breath as needed; Plan activities to conserve energy: plan rest periods; Increase activity as tolerated/progressive mobility  Goal: Patent Airway maintained  Outcome: Progressing  Flowsheets (Taken 04/28/2018 2143)  Patent airway maintained : Position patient for maximum ventilatory efficiency; Suction secretions as needed; Reinforce use of ordered respiratory interventions (i.e. CPAP, BiPAP, Incentive Spirometer, Acapella, etc.); Provide adequate fluid intake to liquefy secretions     Problem: Inadequate Airway Clearance  Goal: Normal respiratory rate/effort achieved/maintained  Outcome: Progressing  Flowsheets (Taken 04/28/2018 2143)  Normal respiratory rate/effort achieved/maintained: Plan activities to conserve energy: plan rest periods     Problem: Neurological Deficit  Goal: Neurological status is stable or improving  Outcome: Progressing  Flowsheets (Taken 04/28/2018 2143)  Neurological status is stable or improving: Perform CAM Assessment     Problem: Peripheral Neurovascular Impairment  Goal: Extremity color, movement, sensation are maintained or improved  Outcome: Progressing  Flowsheets (Taken 04/28/2018 2143)  Extremity color, movement, sensation are maintained or improved : Increase mobility as tolerated/progressive mobility; Assess and monitor application of corrective devices (cast, brace, splint), check skin integrity; Assess extremity for proper alignment       Pt is A&Ox4. RA. With minimal to moderate complaints of left chest pain at CT site area which increases with movement treated with prn Tramadol and Tylenol with some pain relief.   CT attached to Continuous wall suction on -20cm H2O, dressing at the site is c/d/I, x1 dressing changed done.  Atrium noted with continuous Bubbling MD aware.   No bleeding noted on CT site.   As per pt he ambulated total of 90 laps today.   Pt was able to perform on his IS reach at 4000  VSS. Pt refused  his night dose Keppra.    Pt also refused the SCD's.   Pt also does not want to be disturbed 11pm to 7am, also wants his labs to be done after 7am.    Suction set up at the bedside.           for Chest X-Ray In the morning.           Regular Diet tolerated well.           Denies any SOB, N/V.          High Fall risks interventions were implemented, bed at the lowest    position, call bell within reach fall mats in placed, side rails up 3/4.   Rounding with the pt. Will continue to monitor the pt for any changes. Needs attended.

## 2018-04-28 NOTE — Progress Notes (Signed)
Chart reviewed, patient's plan to return home continues.  He may need home IV ABX at discharge.  Will continue to follow    Grace Blight [facility] Discharge Planner  [facility]  [phone]  [email or web address]

## 2018-04-28 NOTE — Progress Note - Problem Oriented Charting Notewrit (Signed)
Patient is ordered for midline insertion for long term IV ABX. Order is about to expire and patient is not near discharge. Please re order for midline when patient's discharge is confirmed.

## 2018-04-28 NOTE — Progress Notes (Signed)
Thoracic Surgery Daily Progress Note  [facility] - Thoracic Surgery   Team Spectra: [phone] / [phone]    Hospital Day: Hospital Day: 13  Post Operative Day: 6 Days Post-Op   Primary Procedure: Procedure(s) (LRB):  THORACOSCOPY, (VATS), DECORTICATION (Left)     Interval History:   NAEON.    Physical Exam:     Vitals:    04/27/18 1105 04/27/18 1528 04/27/18 2053 04/27/18 2311   BP: 147/89 (!) 138/94 125/75 108/75   Pulse: 81 84 87 100   Resp: 16 18 17 16    Temp: 97.3 F (36.3 C) 97.1 F (36.2 C) 98.2 F (36.8 C) 98.1 F (36.7 C)   TempSrc: Oral Oral Oral Oral   SpO2: 99% 98% 98% 98%   Weight:       Height:            PHYSICAL EXAM:  General: AAOx3, NAD  HEENT: Atraumatic, normocephalic, PERRLA  CV: RRR, no murmurs, rubs, gallops  Pulm: CTAB, no wheezes, rhonchi, left CT with 87cc SS fluid, +continuous 1+ air leak    Lines/Drains:   Left chest tube: 87cc SS output, + airleak continuous 1+    Laboratory and Radiology Results:     Recent Labs   Lab 04/23/18  0553 04/21/18  0748   WBC 12.56* 11.81*   RBC 4.57 4.84   Hgb 12.5 13.3   Hematocrit 37.7 39.3   Sodium 137 141   Potassium 4.7 3.9   Chloride 105 106   CO2 20* 24   BUN 10.0 6.0*   Creatinine 0.8 0.8   Calcium 9.2 8.6   Magnesium 2.2  --    Glucose 140* 89                 X-ray Chest Ap Portable    Result Date: 04/28/2018  1. Stable left pneumothorax and small left effusion. Olen Pel, MD 04/28/2018 6:51 AM       Assessment:   Craig Phelps is a 23 y.o. male with left lung abscess and empyema s/p pigtail 2/27 with a persistent left hydropneumothorax now 6 Days Post-Op s/p left VATS, decortication. Persistent air leak continues, expected from abscess. +Fungus from pleural cultures. Continues to do well post-operatively.    Plan:   -- continue left chest tube to continuous suction - , even when ambulating  -- continue ertapenem & voriconazole per ID   -- will need Midline on discharge  -- follow up OR cultures and pathology   -- OR cultures w/ mycelial  fungus  -- daily morning chest x-ray  -- tylenol & PRN tramadol for pain control  -- Encourage aggressive pulm toilet with ambulation > 80 laps daily, IS, PEP/PAP therapy, duo nebs    [name], MD  Thoracic Surgery, R4      ** PLEASE DO NOT PAGE - CALL SPECTRA FIRST IF QUESTIONS **    -------------------------------------------------------------------------------------------------------------------

## 2018-04-29 ENCOUNTER — Inpatient Hospital Stay: Payer: BLUE CROSS/BLUE SHIELD

## 2018-04-29 MED ORDER — ALBUTEROL-IPRATROPIUM 2.5-0.5 (3) MG/3ML IN SOLN
3.00 mL | Freq: Four times a day (QID) | RESPIRATORY_TRACT | Status: DC
Start: 2018-04-29 — End: 2018-05-04
  Administered 2018-04-29 – 2018-05-03 (×11): 3 mL via RESPIRATORY_TRACT
  Filled 2018-04-29 (×15): qty 3

## 2018-04-29 MED ORDER — CALCIUM CARBONATE ANTACID 500 MG PO CHEW
500.00 mg | CHEWABLE_TABLET | Freq: Four times a day (QID) | ORAL | Status: DC | PRN
Start: 2018-04-29 — End: 2018-05-05
  Administered 2018-04-29: 15:00:00 500 mg via ORAL
  Filled 2018-04-29: qty 1

## 2018-04-29 NOTE — Plan of Care (Signed)
Problem: Moderate/High Fall Risk Score >5  Goal: Patient will remain free of falls  Outcome: Progressing  Flowsheets  Taken 04/29/2018 2100 by Lynne Leader, RN  Moderate Risk (6-13): LOW-Fall Interventions Appropriate for Low Fall Risk  High (Greater than 13): LOW-Fall Interventions Appropriate for Low Fall Risk  Taken 04/27/2018 0932 by Milagros Evener, RN  VH Moderate Risk (6-13): Use of floor mat;Use assistive devices  Taken 04/28/2018 2143 by Fabian Sharp, RN  VH High Risk (Greater than 13): BED ALARM WILL BE ACTIVATED WHEN THE PATEINT IS IN BED WITH SIGNAGE "RESET BED ALARM";Use assistive devices;Use of floor mat;Keep door open for better visibility     Problem: Safety  Goal: Patient will be free from injury during hospitalization  Outcome: Progressing  Flowsheets (Taken 04/29/2018 1228 by Milagros Evener, RN)  Patient will be free from injury during hospitalization : Assess patient's risk for falls and implement fall prevention plan of care per policy;Ensure appropriate safety devices are available at the bedside;Provide and maintain safe environment;Include patient/ family/ care giver in decisions related to safety;Use appropriate transfer methods;Hourly rounding;Assess for patients risk for elopement and implement Elopement Risk Plan per policy  Goal: Patient will be free from infection during hospitalization  Outcome: Progressing     Problem: Discharge Barriers  Goal: Patient will be discharged home or other facility with appropriate resources  Outcome: Progressing  Flowsheets (Taken 04/29/2018 1228 by Milagros Evener, RN)  Discharge to home or other facility with appropriate resources: Provide information on available health resources;Provide appropriate patient education     Problem: Inadequate Gas Exchange  Goal: Adequate oxygenation and improved ventilation  Outcome: Progressing  Flowsheets (Taken 04/29/2018 1228 by Milagros Evener, RN)  Adequate oxygenation and improved  ventilation: Assess lung sounds;Position for maximum ventilatory efficiency;Teach/reinforce use of incentive spirometer 10 times per hour while awake, cough and deep breath as needed;Plan activities to conserve energy: plan rest periods;Increase activity as tolerated/progressive mobility  Goal: Patent Airway maintained  Outcome: Progressing  Flowsheets (Taken 04/29/2018 1228 by Milagros Evener, RN)  Patent airway maintained : Position patient for maximum ventilatory efficiency;Provide adequate fluid intake to liquefy secretions;Reinforce use of ordered respiratory interventions (i.e. CPAP, BiPAP, Incentive Spirometer, Acapella, etc.)     Problem: Inadequate Airway Clearance  Goal: Normal respiratory rate/effort achieved/maintained  Outcome: Progressing  Flowsheets (Taken 04/29/2018 1228 by Milagros Evener, RN)  Normal respiratory rate/effort achieved/maintained: Plan activities to conserve energy: plan rest periods     Problem: Neurological Deficit  Goal: Neurological status is stable or improving  Outcome: Progressing  Flowsheets (Taken 04/29/2018 1228 by Milagros Evener, RN)  Neurological status is stable or improving: Perform CAM Assessment     Problem: Peripheral Neurovascular Impairment  Goal: Extremity color, movement, sensation are maintained or improved  Outcome: Progressing  Flowsheets (Taken 04/29/2018 1228 by Milagros Evener, RN)  Extremity color, movement, sensation are maintained or improved : Increase mobility as tolerated/progressive mobility;Assess and monitor application of corrective devices (cast, brace, splint), check skin integrity;Assess extremity for proper alignment     Patient AOx4. Denies n/v, SOB, CP. Vitals stable.  Pain controlled by PRN pain meds with good effect  Chest tube dressing CDI, chest tube to continuous wall suction  Tolerating regular diet well  Patient ambulated >40 laps today  Encouraged use of IS  Chest XR scheduled in the AM  Patient requests to bundle his  care, doesn't want to be disturbed from 2300-0500  Will continue to monitor.

## 2018-04-29 NOTE — Progress Notes (Signed)
PULMONARY MEDICINE PROGRESS NOTE    Date Time: 04/29/18 4:46 PM  Patient Name: [facility] Day: 14     Attending Physician:  Madilyn Hook, MD      Assessment:     Patient Active Problem List   Diagnosis   . Pilonidal cyst   . Seizure   . Pneumothorax   . GERD (gastroesophageal reflux disease)   . Empyema   . Lung abscess       1. Spontaneous PTX  2. Seizure d/o  3. Abscess/vs. 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 parapneumonic effusion    Recommendation:   Continue abx  Agree with Voriconazole.  ID following  CT to suction per Thoracic Surgery  Improving air leak    Will see intermittently      Interval History     Interval History/24 hr. Events:   Review of Systems:   Review of Systems - reviewed and no change from admission    Physical Exam:     Vitals:    04/29/18 1541   BP: 122/76   Pulse: (!) 117   Resp: 16   Temp: (!) 96.5 F (35.8 C)   SpO2: 95%     Temp (24hrs), Avg:96.3 F (35.7 C), Min:95.5 F (35.3 C), Max:97.1 F (36.2 C)      General appearance - alert, well appearing, and in no distress, in bed  Mental status - alert, oriented to person, place, and time  Nose - normal and patent, no erythema, discharge or polyps  Neck - supple, no significant adenopathy  Chest - normal air movement, no wheezing, rhonchi or crackles  Tube tidaling well  Heart - normal rate and regular rhythm  Abdomen - soft, nontender, nondistended, no masses or organomegaly  Neurological - alert, oriented, normal speech, no focal findings or movement disorder noted  Extremities - peripheral pulses normal, no pedal edema, no clubbing or cyanosis    Input/Output:     Intake and Output Summary (Last 24 hours) at Date Time    Intake/Output Summary (Last 24 hours) at 04/29/2018 1646  Last data filed at 04/29/2018 0700  Gross per 24 hour   Intake --   Output 80 ml   Net -80 ml       Meds:     Current Facility-Administered Medications   Medication  Dose Route Frequency   . albuterol  2.5 mg Nebulization Q6H WA   . albuterol-ipratropium  3 mL Nebulization Q6H SCH   . budesonide  0.25 mg Nebulization BID   . ertapenem  1,000 mg Intravenous Daily   . levETIRAcetam  500 mg Intravenous Q12H SCH   . lidocaine  1 patch Transdermal Q24H   . melatonin  3 mg Oral QHS   . pantoprazole  40 mg Oral QAM AC   . voriconazole  200 mg Oral Q12H [facility]       Labs:     Recent Labs   Lab 04/28/18  0815 04/23/18  0553   WBC 11.77* 12.56*   RBC 4.70 4.57   Hgb 12.3* 12.5   Hematocrit 38.2 37.7   Glucose 81 140*   BUN 13.0 10.0   Creatinine 0.8 0.8   Calcium 9.2 9.2   Sodium 139 137   Potassium 4.5 4.7   Chloride 104 105   CO2 23 20*         Radiology:     Radiology Results (  24 Hour)     Procedure Component Value Units Date/Time    X-ray chest AP portable [[id number]] Collected:  04/29/18 0809    Order Status:  Completed Updated:  04/29/18 0814    Narrative:       History: Postop VATS, decortication    COMPARISON: 04/28/2018    FINDINGS: Portable chest demonstrates chest tube in the left apex. Tiny  left apical pneumothorax is unchanged from prior. Air is also seen  laterally and in the costophrenic angle. There is subcutaneous air along  the left chest wall. Normal cardiac silhouette and vasculature. Right  lung is clear      Impression:        Small left pneumothorax, stable    Genelle Bal, MD   04/29/2018 8:10 AM          Imaging personally reviewed, including:         Signed by: Laurel Dimmer, MD  Please tiger text first   Mobile # [phone]  [facility] [place], [place]  [phone]  [phone] [facility] #1)  [phone] [facility] #2)

## 2018-04-29 NOTE — Progress Notes (Signed)
Atrium tipped over, displacing fluid throughout system, replaced with new Atrium at 11:10am.

## 2018-04-29 NOTE — Progress Notes (Signed)
Thoracic Surgery Daily Progress Note  [facility] - Thoracic Surgery   Team Spectra: [phone] / [phone]    Hospital Day: Hospital Day: 14  Post Operative Day: 7 Days Post-Op   Primary Procedure: Procedure(s) (LRB):  THORACOSCOPY, (VATS), DECORTICATION (Left)     Assessment:   Craig Phelps is a 23 y.o. male with left lung abscess and empyema s/p pigtail 2/27 with a persistent left hydropneumothorax now 7 Days Post-Op s/p left VATS, decortication. +Fungus from pleural cultures. Air leak improving.    Plan:   -- continue left chest tube to continuous suction - , even when ambulating  -- continue ertapenem & voriconazole per ID. Will need to initiate D/C planning for home IV abx  -- follow up OR cultures and pathology  -- daily morning chest x-ray  -- tylenol & PRN tramadol for pain control  -- Encourage aggressive pulm toilet with ambulation > 80 laps daily, IS, PEP/PAP therapy, duo nebs      Lenis Dickinson, [facility]  Thoracic Surgery Physician Assistant  Team Spectra [phone]  Spectra [phone]        Interval History:   NAEON. Ambulated >60 laps    Physical Exam:     Vitals:    04/28/18 1144 04/28/18 1544 04/28/18 2121 04/28/18 2311   BP: 118/78 114/73 135/83 119/74   Pulse: 71 75 (!) 101 74   Resp: 17 17 20 18    Temp: 98 F (36.7 C) 98 F (36.7 C) (!) 95.5 F (35.3 C) (!) 96 F (35.6 C)   TempSrc: Oral Oral Axillary Oral   SpO2: 98% 97% 96% 95%   Weight:       Height:            PHYSICAL EXAM:  General: AAOx3, NAD  HEENT: Atraumatic, normocephalic, PERRLA  CV: RRR  Pulm: CTAB, good respiratory effort    Lines/Drains:   Left chest tube: 50cc SS output, Intermittent air leak    Laboratory and Radiology Results:     Recent Labs   Lab 04/28/18  0815 04/23/18  0553   WBC 11.77* 12.56*   RBC 4.70 4.57   Hgb 12.3* 12.5   Hematocrit 38.2 37.7   Sodium 139 137   Potassium 4.5 4.7   Chloride 104 105   CO2 23 20*   BUN 13.0 10.0   Creatinine 0.8 0.8   Calcium 9.2 9.2   Magnesium 1.9 2.2   Glucose 81 140*                 X-ray Chest Ap  Portable    Result Date: 04/29/2018   Small left pneumothorax, stable Genelle Bal, MD 04/29/2018 8:10 AM

## 2018-04-29 NOTE — Progress Notes (Signed)
Called Portable Chest X-Ray, call received by Selena Batten,, informed her that pt does have an order for Chest X-ray at 5am.

## 2018-04-29 NOTE — Plan of Care (Signed)
Patient is A&Ox4.  VSS. No c/o n/v/sob. C/o pain to left chest. Lidocaine patch in place. Tylenol give PRN with good relief.  Pt has chest tube to left lateral chest. Water seal, to wall suction. Atrium replaced today.  Pt ambulates around unit and off unit on suction.  Pt tolerates diet.  IV abx given as ordered.  Safety in place. WCTM    Problem: Moderate/High Fall Risk Score >5  Goal: Patient will remain free of falls  Outcome: Progressing     Problem: Safety  Goal: Patient will be free from injury during hospitalization  Outcome: Progressing  Flowsheets (Taken 04/29/2018 1228)  Patient will be free from injury during hospitalization : Assess patient's risk for falls and implement fall prevention plan of care per policy; Ensure appropriate safety devices are available at the bedside; Provide and maintain safe environment; Include patient/ family/ care giver in decisions related to safety; Use appropriate transfer methods; Hourly rounding; Assess for patients risk for elopement and implement Elopement Risk Plan per policy  Goal: Patient will be free from infection during hospitalization  Outcome: Progressing  Flowsheets (Taken 04/29/2018 1228)  Free from Infection during hospitalization: Assess and monitor for signs and symptoms of infection; Monitor lab/diagnostic results; Monitor all insertion sites (i.e. indwelling lines, tubes, urinary catheters, and drains); Encourage patient and family to use good hand hygiene technique     Problem: Discharge Barriers  Goal: Patient will be discharged home or other facility with appropriate resources  Outcome: Progressing  Flowsheets (Taken 04/29/2018 1228)  Discharge to home or other facility with appropriate resources: Provide information on available health resources; Provide appropriate patient education     Problem: Inadequate Gas Exchange  Goal: Adequate oxygenation and improved ventilation  Outcome: Progressing  Flowsheets (Taken 04/29/2018 1228)  Adequate oxygenation and  improved ventilation: Assess lung sounds; Position for maximum ventilatory efficiency; Teach/reinforce use of incentive spirometer 10 times per hour while awake, cough and deep breath as needed; Plan activities to conserve energy: plan rest periods; Increase activity as tolerated/progressive mobility  Goal: Patent Airway maintained  Outcome: Progressing  Flowsheets (Taken 04/29/2018 1228)  Patent airway maintained : Position patient for maximum ventilatory efficiency; Provide adequate fluid intake to liquefy secretions; Reinforce use of ordered respiratory interventions (i.e. CPAP, BiPAP, Incentive Spirometer, Acapella, etc.)     Problem: Inadequate Airway Clearance  Goal: Normal respiratory rate/effort achieved/maintained  Outcome: Progressing  Flowsheets (Taken 04/29/2018 1228)  Normal respiratory rate/effort achieved/maintained: Plan activities to conserve energy: plan rest periods     Problem: Neurological Deficit  Goal: Neurological status is stable or improving  Outcome: Progressing  Flowsheets (Taken 04/29/2018 1228)  Neurological status is stable or improving: Perform CAM Assessment     Problem: Peripheral Neurovascular Impairment  Goal: Extremity color, movement, sensation are maintained or improved  Outcome: Progressing  Flowsheets (Taken 04/29/2018 1228)  Extremity color, movement, sensation are maintained or improved : Increase mobility as tolerated/progressive mobility; Assess and monitor application of corrective devices (cast, brace, splint), check skin integrity; Assess extremity for proper alignment

## 2018-04-29 NOTE — Progress Notes (Signed)
ID PROGRESS NOTE      Spectra: [phone]    Office: [phone]    Date Time: 04/29/18 @NOW   Patient Name: Craig Phelps, Craig Phelps    Problem List:      Acute:  Subacute pneumonitis, likely aspiration related as the patient mentions of GERD and frequent alcohol intoxication with vomiting episodes.  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 Pleural material now noted to contain hyphae. Pt is doing well clinically. Hx of frequent vomiting, including due to excesses of alcohol. No evidence of immunosuppression. The most likely chain of events is a cavity which formed due to aspiration becoming superinfected with fungal organism and leading to rupture into the pleural space.    Multiple cultures with fungus. ID pending    Antimicrobials:   abx #14  #10 Ertapenem 1 gram IV daily  #6 Voriconazole 200mg  PO BID    Plan:     Continue Ertapenem for possible bacterial component  Continue Voriconazole  ID of the fungus pending  Fungal markers pending (Aspergillus Ag negative, Hisptoplasma Galactomannan Ag pending)  Discussed with pt and mother    [facility] Continuum of Care Program  [phone]  This patient is being evaluated for the need for intravenous antibiotics after discharge    Diagnosis requiring antibiotics: Empyema    ANTIBIOTIC RECOMMENDATION/DISCHARGE PLANNING  Recommendation for Definitive outpatient Antibiotic:  Ertapenem 1 gm IV daily until 05/17/18   Weekly CBC, CMP   Discharge Destination:   Home with family    Discharge Planning:       ID  requirements for Discharge: Line, home IVAB to be set up    Follow up ID appointment placed: Pt will call    Discussed with Case Management: no   Home Health Face-to-Face Placed: 04/29/18    IV ACCESS RECOMMENDATIONS: IV Access Status: will order   Preferred Post Discharge IV Access: midline      For any questions regarding home outpatient antibiotics, please call [phone]      Lines:     Patient Lines/Drains/Airways Status    Active PICC Line / CVC Line / PIV Line / Drain / Airway / Intraosseous Line / Epidural Line / ART Line / Line / Wound / Pressure Ulcer / NG/OG Tube     Name:   Placement date:   Placement time:   Site:   Days:    Peripheral IV 04/25/18 Left Antecubital   04/25/18    2146    Antecubital   2    Chest Tube 1 Other (Comment) 28 Fr.   04/22/18    2210    --   5    Wound 04/22/18 Surgical Incision Chest Left DERMABOND   04/22/18    2237    Chest   5                Family History:     Family History   Problem Relation Age of Onset    ADD / ADHD Father     ADD /  ADHD Brother     Cancer Paternal Grandfather 59        bladder, smoker    Pulmonary fibrosis Maternal Grandmother         also with ITP    Alzheimer's disease Paternal Grandmother 61       Social History:     Social History     Socioeconomic History    Marital status: Single     Spouse name: Not on file    Number of children: 0    Years of education: Not on file    Highest education level: Not on file   Occupational History    Occupation: Archivist   Social Needs    Financial resource strain: Not on file    Food insecurity:     Worry: Not on file     Inability: Not on file    Transportation needs:     Medical: Not on file     Non-medical: Not on file   Tobacco Use    Smoking status: Former Smoker    Smokeless tobacco: Never Used    Tobacco comment: quit 1 week ago   Substance and Sexual Activity    Alcohol use: Not Currently     Alcohol/week: 0.0 standard drinks    Drug use: No    Sexual activity: Yes     Partners:  Female     Comment: monogamous   Lifestyle    Physical activity:     Days per week: Not on file     Minutes per session: Not on file    Stress: Not on file   Relationships    Social connections:     Talks on phone: Not on file     Gets together: Not on file     Attends religious service: Not on file     Active member of club or organization: Not on file     Attends meetings of clubs or organizations: Not on file     Relationship status: Not on file    Intimate partner violence:     Fear of current or ex partner: Not on file     Emotionally abused: Not on file     Physically abused: Not on file     Forced sexual activity: Not on file   Other Topics Concern    Not on file   Social History Narrative    Taking a break from school    Wanting to transfer for the fall    Working at [employer]        Exercise: no     Diet: nothing special, tries to avoid GERD inducing foods       Allergies:     Allergies   Allergen Reactions    Other      Environmental allergies       Review of Systems:   General ROS: negative for - chills, fevers, night sweats, weight loss   HEENT: negative for - blurry vision, sore throat, thrush   Respiratory ROS: negative for cough, SOB  Cardiovascular ROS: negative for - chest pain, palpitations   Gastrointestinal ROS: negative for - abdominal pain, nausea, vomiting, diarrhea  Genito-Urinary ROS: negative for - dysuria, urinary frequency/urgency   Musculoskeletal ROS: negative for - joint pain, joint stiffness or muscle pain   Dermatological ROS: negative for - rash and skin lesion changes   Neurological ROS: negative for - confusion, headache, dizziness  Hematological ROS: negative for - bruising,  bleeding   Psychological ROS: negative for - changes in mood    Physical Exam:     Vitals:    04/29/18 1541   BP: 122/76   Pulse: (!) 117   Resp: 16   Temp: (!) 96.5 F (35.8 C)   SpO2: 95%       General Appearance: alert and appropriate, non-toxic  Neuro: alert, oriented, normal speech, normal  attention and cognition  HEENT: no scleral icterus, pupils round and reactive, OP clear  Neck: supple  Lungs: clear to auscultation, no wheezes, rales or rhonchi, left CT   Cardiac: normal rate, regular rhythm, normal S1, S2,   Abdomen: soft, non-tender, non-distended, normal active bowel sounds  Extremities: no pedal edema  Skin: no rash  Psych: normal mood and affect    Labs:     Lab Results   Component Value Date    WBC 11.77 (H) 04/28/2018    HGB 12.3 (L) 04/28/2018    HCT 38.2 04/28/2018    MCV 81.3 04/28/2018    PLT 659 (H) 04/28/2018     Lab Results   Component Value Date    CREAT 0.8 04/28/2018     Lab Results   Component Value Date    ALT 11 04/16/2018    AST 14 04/16/2018    ALKPHOS 93 04/16/2018    BILITOTAL 0.9 04/16/2018     No results found for: LACTATE    Microbiology:     Microbiology Results     Procedure Component Value Units Date/Time    AFB Culture & Smear [[id number]] Collected:  04/19/18 1053    Specimen:  Sputum from Pleural Fluid Updated:  04/27/18 2006    Narrative:       ORDER#: [id number]                                    ORDERED BY: Alinda Money  SOURCE: Pleural Fluid left                           COLLECTED:  04/19/18 10:53  ANTIBIOTICS AT COLL.:                                RECEIVED :  04/19/18 15:56  Stain, Acid Fast                           FINAL       04/19/18 19:10  04/19/18   No Acid Fast Bacillus Seen  Culture Acid Fast Bacillus (AFB)           PRELIM      04/27/18 20:04  04/27/18   No growth after 1 week/s of incubation.      AFB culture and smear [[id number]] Collected:  04/22/18 2122    Specimen:  Sputum from Tissue, Deep Updated:  04/23/18 1132    Narrative:       ORDER#: [id number]                                    ORDERED BY: Dorothey Baseman, SANDE  SOURCE: Tissue, Deep LEFT ABCESS CAVITY              COLLECTED:  04/22/18 21:22  ANTIBIOTICS AT COLL.:                                RECEIVED :  04/23/18 02:26  Stain, Acid Fast                           FINAL       04/23/18  11:32  04/23/18   No Acid Fast Bacillus Seen  Culture Acid Fast Bacillus (AFB)           PENDING      AFB culture and smear [[id number]] Collected:  04/22/18 2040    Specimen:  Sputum from Tissue, Deep Updated:  04/23/18 1132    Narrative:       ORDER#: [id number]                                    ORDERED BY: Dorothey Baseman, SANDE  SOURCE: Tissue, Deep LEFT PLEURAL WITH DEBRIS        COLLECTED:  04/22/18 20:40  ANTIBIOTICS AT COLL.:                                RECEIVED :  04/23/18 02:30  Stain, Acid Fast                           FINAL       04/23/18 11:32  04/23/18   No Acid Fast Bacillus Seen  Culture Acid Fast Bacillus (AFB)           PENDING      Anaerobic culture [[id number]] Collected:  04/22/18 2040    Specimen:  Other from Tissue Updated:  04/27/18 0729    Narrative:       ORDER#: [id number]                                    ORDERED BY: Dorothey Baseman, SANDE  SOURCE: Tissue LEFT PLEURAL WITH DEBRIS              COLLECTED:  04/22/18 20:40  ANTIBIOTICS AT COLL.:                                RECEIVED :  04/23/18 02:30  Culture, Anaerobic Bacteria                FINAL       04/27/18 07:29  04/27/18   No anaerobic growth      Anaerobic culture [[id number]] Collected:  04/22/18 2122    Specimen:  Other from Abscess Updated:  04/25/18 1144    Narrative:       ORDER#: [id number]                                    ORDERED BY: Dorothey Baseman, SANDE  SOURCE: Abscess LEFT ABCESS CAVITY                   COLLECTED:  04/22/18 21:22  ANTIBIOTICS AT COLL.:  RECEIVED :  04/23/18 02:26  Culture, Anaerobic Bacteria                PRELIM      04/25/18 11:44  04/25/18   No growth to date, final report to follow      CULTURE + Dierdre Forth [[id number]] Collected:  04/17/18 1532    Specimen:  Sputum, Induced Updated:  04/19/18 1829    Narrative:       ORDER#: [id number]                                    ORDERED BY: Timothy Lasso  SOURCE: Sputum, Induced induce sputum                COLLECTED:   04/17/18 15:32  ANTIBIOTICS AT COLL.:                                RECEIVED :  04/17/18 18:14  Stain, Gram (Respiratory)                  FINAL       04/17/18 19:57  04/17/18   Moderate WBC's             Few Squamous epithelial cells             Moderate Mixed Respiratory Flora  Culture and Gram Stain, Aerobic, RespiratorFINAL       04/19/18 18:29  04/19/18   Moderate growth of mixed upper respiratory flora      Culture + Gram Stain,Aerobic, Body Fluid [[id number]] Collected:  04/19/18 1053    Specimen:  Body Fluid from Pleural Fluid Updated:  04/23/18 1525    Narrative:       ORDER#: [id number]                                    ORDERED BY: Alinda Money  SOURCE: Pleural Fluid left                           COLLECTED:  04/19/18 10:53  ANTIBIOTICS AT COLL.:                                RECEIVED :  04/19/18 15:55  Stain, Gram                                FINAL       04/19/18 18:12  04/19/18   Moderate WBCs             No organisms seen  Culture and Gram Stain, Aerobic, Body FluidFINAL       04/23/18 15:25  04/23/18   No growth      Fungus culture [[id number]] Collected:  04/22/18 2122    Specimen:  Other from Tissue Updated:  04/23/18 1130    Narrative:       ORDER#: [id number]                                    ORDERED BY: Letitia Libra  SOURCE: Tissue  LEFT ABCESS CAVITY                    COLLECTED:  04/22/18 21:22  ANTIBIOTICS AT COLL.:                                RECEIVED :  04/23/18 02:26  Stain, Fungal                              FINAL       04/23/18 11:29  04/23/18   No Fungal or Yeast Elements Seen  Culture Fungus                             PENDING      Fungus culture [[id number]] Collected:  04/22/18 2040    Specimen:  Other from Biopsy Updated:  04/23/18 1126    Narrative:       [id number] called Micro Results of pos fungal stain. Results read back [id number],  by [id number] on 04/23/2018 at 11:26  ORDER#: [id number]                                    ORDERED BY: LOVFIEPP, SANDE  SOURCE: Biopsy LEFT PLEURAL  WITH DEBRIS              COLLECTED:  04/22/18 20:40  ANTIBIOTICS AT COLL.:                                RECEIVED :  04/23/18 02:30  [id number] called Micro Results of pos fungal stain. Results read back [id number], by [id number] on 04/23/2018 at 11:26  Stain, Fungal                              FINAL       04/23/18 11:26   +  04/23/18   Many Septate Hyphae Seen  Culture Fungus                             PENDING      Legionella antigen, urine [[id number]] Collected:  04/20/18 2004    Specimen:  Urine, Catheterized, Foley Updated:  04/21/18 0403    Narrative:       ORDER#: [id number]                                    ORDERED BY: Geraldine Solar  SOURCE: Urine, Catheterized, Foley                   COLLECTED:  04/20/18 20:04  ANTIBIOTICS AT COLL.:                                RECEIVED :  04/20/18 23:45  Legionella, Rapid Urinary Antigen          FINAL       04/21/18 04:03  04/21/18   Negative for Legionella pneumophila Serogroup 1 Antigen  Limitations of Test:             1. Negative results do not exclude infection with Legionella                pneumophila Serogroup 1.             2. Does not detect other serogroups of L. pneumophila                or other Legionella species.             Test Reference Range: Negative      Presurgical Surveillance, MSSA+MRSA [[id number]] Collected:  04/20/18 1630    Specimen:  Nares Updated:  04/21/18 1619     Culture Staph and MRSA Surveillance --     No Staph aureus isolated and  No Methicillin Resistant Staph aureus isolated      Narrative:       Please swab nares and throat    Presurgical Surveillance, MSSA+MRSA [[id number]] Collected:  04/20/18 1630    Specimen:  Throat Updated:  04/21/18 1619     Culture Staph and MRSA Surveillance --     No Staph aureus isolated and  No Methicillin Resistant Staph aureus isolated      S. PNEUMONIAE, RAPID URINARY ANTIGEN [[id number]] Collected:  04/20/18 2004    Specimen:  Urine, Clean Catch Updated:  04/21/18 0402    Narrative:        ORDER#: [id number]                                    ORDERED BY: Geraldine Solar  SOURCE: Urine, Clean Catch                           COLLECTED:  04/20/18 20:04  ANTIBIOTICS AT COLL.:                                RECEIVED :  04/20/18 23:45  S. pneumoniae, Rapid Urinary Antigen       FINAL       04/21/18 04:02  04/21/18   Negative for Streptococcus pneumoniae Urinary Antigen             Note:             This is a presumptive test for the direct qualitative             detection of bacterial antigen. This test is not intended as             a substitute for a gram stain and bacterial culture. Samples             with extremely low levels of antigen may yield negative             results.             Reference Range: Negative      Wound culture & gram stain [[id number]] Collected:  04/22/18 2122    Specimen:  Wound from Abscess Updated:  04/27/18 1226    Narrative:       ORDER#: [id number]                                    ORDERED BY: Dorothey Baseman,  SANDE  SOURCE: Abscess LEFT ABCESS CAVITY                   COLLECTED:  04/22/18 21:22  ANTIBIOTICS AT COLL.:                                RECEIVED :  04/23/18 02:26  Stain, Gram                                FINAL       04/23/18 03:42  04/23/18   No Squamous epithelial cells seen             Moderate WBCs             No organisms seen  Culture and Gram Stain, Aerobic, Wound     PRELIM      04/27/18 12:26   +  04/24/18   Culture no growth to date, Final report to follow  04/25/18   Light growth of Mycelial Fungus               Refer to Identification on culture #[id number]        Wound culture & gram stain [[id number]] Collected:  04/22/18 2040    Specimen:  Wound Updated:  04/27/18 1226    Narrative:       ORDER#: [id number]                                    ORDERED BY: Dorothey Baseman, SANDE  SOURCE: Wound LEFT PLEURAL WITH DEBRIS               COLLECTED:  04/22/18 20:40  ANTIBIOTICS AT COLL.:                                RECEIVED :  04/23/18 02:30  Stain, Gram                                 FINAL       04/23/18 05:06  04/23/18   No Squamous epithelial cells seen             Many WBCs             No organisms seen  Culture and Gram Stain, Aerobic, Wound     PRELIM      04/27/18 12:26   +  04/24/18   Culture no growth to date, Final report to follow  04/25/18   Moderate growth of Mycelial Fungus               Identification to follow              Rads:   X-ray Chest Ap Portable    Result Date: 04/29/2018   Small left pneumothorax, stable Genelle Bal, MD 04/29/2018 8:10 AM      Signed by: Darlina Guys, NP

## 2018-04-30 ENCOUNTER — Inpatient Hospital Stay: Payer: BLUE CROSS/BLUE SHIELD

## 2018-04-30 LAB — COMPREHENSIVE METABOLIC PANEL
ALT: 16 U/L (ref 0–55)
AST (SGOT): 17 U/L (ref 5–34)
Albumin/Globulin Ratio: 0.8 — ABNORMAL LOW (ref 0.9–2.2)
Albumin: 3.9 g/dL (ref 3.5–5.0)
Alkaline Phosphatase: 111 U/L — ABNORMAL HIGH (ref 38–106)
BUN: 14 mg/dL (ref 9.0–28.0)
Bilirubin, Total: 0.3 mg/dL (ref 0.2–1.2)
CO2: 22 mEq/L (ref 22–29)
Calcium: 10.3 mg/dL (ref 8.5–10.5)
Chloride: 103 mEq/L (ref 100–111)
Creatinine: 0.9 mg/dL (ref 0.7–1.3)
Globulin: 4.9 g/dL — ABNORMAL HIGH (ref 2.0–3.6)
Glucose: 67 mg/dL — ABNORMAL LOW (ref 70–100)
Potassium: 4.7 mEq/L (ref 3.5–5.1)
Protein, Total: 8.8 g/dL — ABNORMAL HIGH (ref 6.0–8.3)
Sodium: 140 mEq/L (ref 136–145)

## 2018-04-30 LAB — URINE HISTOPLASMA GALACTOMANNAN AG

## 2018-04-30 LAB — MAGNESIUM: Magnesium: 2.3 mg/dL (ref 1.6–2.6)

## 2018-04-30 LAB — GFR: EGFR: >60

## 2018-04-30 NOTE — Progress Notes (Signed)
Thoracic Surgery Daily Progress Note  [facility] - Thoracic Surgery   Team Spectra: [phone] / [phone]    Hospital Day: Hospital Day: 15  Post Operative Day: 8 Days Post-Op   Primary Procedure: Procedure(s) (LRB):  THORACOSCOPY, (VATS), DECORTICATION (Left)     Assessment:   Craig Phelps is a 23 y.o. male with left lung abscess and empyema s/p pigtail 2/27 with a persistent left hydropneumothorax now 7 Days Post-Op s/p left VATS, decortication. +Fungus from pleural cultures. Continues to have air leak.    Plan:   -- continue left chest tube to continuous suction - , even when ambulating  -- continue ertapenem & voriconazole per ID.   -- daily morning chest x-ray  -- tylenol & PRN tramadol for pain control  -- Encourage aggressive pulm toilet with ambulation, IS, PEP/PAP therapy, duo nebs      Lenis Dickinson, [facility]  Thoracic Surgery Physician Assistant  Team Spectra [phone]  Spectra [phone]        Interval History:   NAEON. Ambulated >60 laps    Physical Exam:     Vitals:    04/29/18 2200 04/29/18 2246 04/30/18 0900 04/30/18 1214   BP:  124/68 143/75 133/87   Pulse: (!) 105 78 82 90   Resp:  16 16 16    Temp:  97.4 F (36.3 C) 97.3 F (36.3 C) 97.2 F (36.2 C)   TempSrc:  Oral Oral Oral   SpO2:  98% 95% 98%   Weight:       Height:            PHYSICAL EXAM:  General: AAOx3, NAD  HEENT: Atraumatic, normocephalic, PERRLA  CV: RRR  Pulm: CTAB, good respiratory effort    Lines/Drains:   Left chest tube: 45cc SS output, Continuous grade I air leak    Laboratory and Radiology Results:     Recent Labs   Lab 04/30/18  1213 04/28/18  0815   WBC  --  11.77*   RBC  --  4.70   Hgb  --  12.3*   Hematocrit  --  38.2   Sodium 140 139   Potassium 4.7 4.5   Chloride 103 104   CO2 22 23   BUN 14.0 13.0   Creatinine 0.9 0.8   Calcium 10.3 9.2   Magnesium 2.3 1.9   Glucose 67* 81                 X-ray Chest Ap Portable    Result Date: 04/30/2018  . Stable to minimally increased residual left pneumothorax given rotation Marty Heck, MD  04/30/2018 9:20 AM

## 2018-04-30 NOTE — Progress Notes (Signed)
ID PROGRESS NOTE      Spectra: [phone]    Office: [phone]    Date Time: 04/30/18 @NOW   Patient Name: Craig Phelps, Craig Phelps    Problem List:      Acute:  Subacute pneumonitis, likely aspiration related as the patient mentions of GERD and frequent alcohol intoxication with vomiting episodes.  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 Pleural material now noted to contain hyphae. Pt is doing well clinically. Hx of frequent vomiting, including due to excesses of alcohol. No evidence of immunosuppression. The most likely chain of events is a cavity which formed due to aspiration becoming superinfected with fungal organism and leading to rupture into the pleural space.    Multiple cultures with fungus. ID pending    Clinically stable   Afebrile     Antimicrobials:   abx #15  #11 Ertapenem 1 gram IV daily  #7 Voriconazole 200mg  PO BID    Plan:     Continue Ertapenem for possible bacterial component  Continue Voriconazole  ID of the fungus pending  Fungal markers pending (Aspergillus Ag negative, Hisptoplasma Galactomannan negative)      [facility] Contin[facility] of Care Program  [phone]  This patient is being evaluated for the need for intravenous antibiotics after discharge    Diagnosis requiring antibiotics: Empyema    ANTIBIOTIC RECOMMENDATION/DISCHARGE PLANNING  Recommendation for Definitive outpatient Antibiotic:  Ertapenem 1 gm IV daily until 05/17/18   Weekly CBC, CMP   Discharge Destination:   Home with family    Discharge Planning:        ID requirements for Discharge: Line, home IVAB to be set up    Follow up ID appointment placed: Pt will call    Discussed with Case Management: no   Home Health Face-to-Face Placed: 04/29/18    IV ACCESS RECOMMENDATIONS: IV Access Status: will order   Preferred Post Discharge IV Access: midline      For any questions regarding home outpatient antibiotics, please call [phone]      Lines:     Patient Lines/Drains/Airways Status    Active PICC Line / CVC Line / PIV Line / Drain / Airway / Intraosseous Line / Epidural Line / ART Line / Line / Wound / Pressure Ulcer / NG/OG Tube     Name:   Placement date:   Placement time:   Site:   Days:    Peripheral IV 04/25/18 Left Antecubital   04/25/18    2146    Antecubital   2    Chest Tube 1 Other (Comment) 28 Fr.   04/22/18    2210    --   5    Wound 04/22/18 Surgical Incision Chest Left DERMABOND   04/22/18    2237    Chest   5                Family History:     Family History   Problem Relation Age of Onset    ADD / ADHD Father  ADD / ADHD Brother     Cancer Paternal Grandfather 51        bladder, smoker    Pulmonary fibrosis Maternal Grandmother         also with ITP    Alzheimer's disease Paternal Grandmother 26       Social History:     Social History     Socioeconomic History    Marital status: Single     Spouse name: Not on file    Number of children: 0    Years of education: Not on file    Highest education level: Not on file   Occupational History    Occupation: Archivist   Social Needs    Financial resource strain: Not on file    Food insecurity:     Worry: Not on file     Inability: Not on file    Transportation needs:     Medical: Not on file     Non-medical: Not on file   Tobacco Use    Smoking status: Former Smoker    Smokeless tobacco: Never Used    Tobacco comment: quit 1 week ago   Substance and Sexual Activity    Alcohol use: Not Currently     Alcohol/week: 0.0 standard drinks    Drug use: No    Sexual activity: Yes      Partners: Female     Comment: monogamous   Lifestyle    Physical activity:     Days per week: Not on file     Minutes per session: Not on file    Stress: Not on file   Relationships    Social connections:     Talks on phone: Not on file     Gets together: Not on file     Attends religious service: Not on file     Active member of club or organization: Not on file     Attends meetings of clubs or organizations: Not on file     Relationship status: Not on file    Intimate partner violence:     Fear of current or ex partner: Not on file     Emotionally abused: Not on file     Physically abused: Not on file     Forced sexual activity: Not on file   Other Topics Concern    Not on file   Social History Narrative    Taking a break from school    Wanting to transfer for the fall    Working at [employer]        Exercise: no     Diet: nothing special, tries to avoid GERD inducing foods       Allergies:     Allergies   Allergen Reactions    Other      Environmental allergies       Review of Systems:   General ROS: negative for - chills, fevers, night sweats, weight loss   HEENT: negative for - blurry vision, sore throat, thrush   Respiratory ROS: negative for cough, SOB  Cardiovascular ROS: negative for - chest pain, palpitations   Gastrointestinal ROS: negative for - abdominal pain, nausea, vomiting, diarrhea  Genito-Urinary ROS: negative for - dysuria, urinary frequency/urgency   Musculoskeletal ROS: negative for - joint pain, joint stiffness or muscle pain   Dermatological ROS: negative for - rash and skin lesion changes   Neurological ROS: negative for - confusion, headache, dizziness  Hematological ROS: negative for -  bruising, bleeding   Psychological ROS: negative for - changes in mood    Physical Exam:     Vitals:    04/30/18 1608   BP: 112/87   Pulse: (!) 101   Resp: 16   Temp: 97.2 F (36.2 C)   SpO2: 98%       General Appearance: alert and appropriate, non-toxic  Neuro: alert, oriented, normal speech,  normal attention and cognition  HEENT: no scleral icterus, pupils round and reactive, OP clear  Neck: supple  Lungs: clear to auscultation, no wheezes, rales or rhonchi, left CT   Cardiac: normal rate, regular rhythm, normal S1, S2,   Abdomen: soft, non-tender, non-distended, normal active bowel sounds  Extremities: no pedal edema  Skin: no rash  Psych: normal mood and affect    Labs:     Lab Results   Component Value Date    WBC 11.77 (H) 04/28/2018    HGB 12.3 (L) 04/28/2018    HCT 38.2 04/28/2018    MCV 81.3 04/28/2018    PLT 659 (H) 04/28/2018     Lab Results   Component Value Date    CREAT 0.9 04/30/2018     Lab Results   Component Value Date    ALT 16 04/30/2018    AST 17 04/30/2018    ALKPHOS 111 (H) 04/30/2018    BILITOTAL 0.3 04/30/2018     No results found for: LACTATE    Microbiology:     Microbiology Results     Procedure Component Value Units Date/Time    AFB Culture & Smear [[id number]] Collected:  04/19/18 1053    Specimen:  Sputum from Pleural Fluid Updated:  04/27/18 2006    Narrative:       ORDER#: [id number]                                    ORDERED BY: Alinda Money  SOURCE: Pleural Fluid left                           COLLECTED:  04/19/18 10:53  ANTIBIOTICS AT COLL.:                                RECEIVED :  04/19/18 15:56  Stain, Acid Fast                           FINAL       04/19/18 19:10  04/19/18   No Acid Fast Bacillus Seen  Culture Acid Fast Bacillus (AFB)           PRELIM      04/27/18 20:04  04/27/18   No growth after 1 week/s of incubation.      AFB culture and smear [[id number]] Collected:  04/22/18 2122    Specimen:  Sputum from Tissue, Deep Updated:  04/23/18 1132    Narrative:       ORDER#: [id number]                                    ORDERED BY: Dorothey Baseman, SANDE  SOURCE: Tissue, Deep LEFT ABCESS CAVITY              COLLECTED:  04/22/18 21:22  ANTIBIOTICS AT COLL.:                                RECEIVED :  04/23/18 02:26  Stain, Acid Fast                           FINAL        04/23/18 11:32  04/23/18   No Acid Fast Bacillus Seen  Culture Acid Fast Bacillus (AFB)           PENDING      AFB culture and smear [[id number]] Collected:  04/22/18 2040    Specimen:  Sputum from Tissue, Deep Updated:  04/23/18 1132    Narrative:       ORDER#: [id number]                                    ORDERED BY: Dorothey Baseman, SANDE  SOURCE: Tissue, Deep LEFT PLEURAL WITH DEBRIS        COLLECTED:  04/22/18 20:40  ANTIBIOTICS AT COLL.:                                RECEIVED :  04/23/18 02:30  Stain, Acid Fast                           FINAL       04/23/18 11:32  04/23/18   No Acid Fast Bacillus Seen  Culture Acid Fast Bacillus (AFB)           PENDING      Anaerobic culture [[id number]] Collected:  04/22/18 2040    Specimen:  Other from Tissue Updated:  04/27/18 0729    Narrative:       ORDER#: [id number]                                    ORDERED BY: Dorothey Baseman, SANDE  SOURCE: Tissue LEFT PLEURAL WITH DEBRIS              COLLECTED:  04/22/18 20:40  ANTIBIOTICS AT COLL.:                                RECEIVED :  04/23/18 02:30  Culture, Anaerobic Bacteria                FINAL       04/27/18 07:29  04/27/18   No anaerobic growth      Anaerobic culture [[id number]] Collected:  04/22/18 2122    Specimen:  Other from Abscess Updated:  04/25/18 1144    Narrative:       ORDER#: [id number]                                    ORDERED BY: Dorothey Baseman, SANDE  SOURCE: Abscess LEFT ABCESS CAVITY                   COLLECTED:  04/22/18 21:22  ANTIBIOTICS AT COLL.:  RECEIVED :  04/23/18 02:26  Culture, Anaerobic Bacteria                PRELIM      04/25/18 11:44  04/25/18   No growth to date, final report to follow      CULTURE + Dierdre Forth [[id number]] Collected:  04/17/18 1532    Specimen:  Sputum, Induced Updated:  04/19/18 1829    Narrative:       ORDER#: [id number]                                    ORDERED BY: Timothy Lasso  SOURCE: Sputum, Induced induce sputum                COLLECTED:   04/17/18 15:32  ANTIBIOTICS AT COLL.:                                RECEIVED :  04/17/18 18:14  Stain, Gram (Respiratory)                  FINAL       04/17/18 19:57  04/17/18   Moderate WBC's             Few Squamous epithelial cells             Moderate Mixed Respiratory Flora  Culture and Gram Stain, Aerobic, RespiratorFINAL       04/19/18 18:29  04/19/18   Moderate growth of mixed upper respiratory flora      Culture + Gram Stain,Aerobic, Body Fluid [[id number]] Collected:  04/19/18 1053    Specimen:  Body Fluid from Pleural Fluid Updated:  04/23/18 1525    Narrative:       ORDER#: [id number]                                    ORDERED BY: Alinda Money  SOURCE: Pleural Fluid left                           COLLECTED:  04/19/18 10:53  ANTIBIOTICS AT COLL.:                                RECEIVED :  04/19/18 15:55  Stain, Gram                                FINAL       04/19/18 18:12  04/19/18   Moderate WBCs             No organisms seen  Culture and Gram Stain, Aerobic, Body FluidFINAL       04/23/18 15:25  04/23/18   No growth      Fungus culture [[id number]] Collected:  04/22/18 2122    Specimen:  Other from Tissue Updated:  04/23/18 1130    Narrative:       ORDER#: [id number]                                    ORDERED BY: Letitia Libra  SOURCE: Tissue  LEFT ABCESS CAVITY                    COLLECTED:  04/22/18 21:22  ANTIBIOTICS AT COLL.:                                RECEIVED :  04/23/18 02:26  Stain, Fungal                              FINAL       04/23/18 11:29  04/23/18   No Fungal or Yeast Elements Seen  Culture Fungus                             PENDING      Fungus culture [[id number]] Collected:  04/22/18 2040    Specimen:  Other from Biopsy Updated:  04/23/18 1126    Narrative:       [id number] called Micro Results of pos fungal stain. Results read back [id number],  by [id number] on 04/23/2018 at 11:26  ORDER#: [id number]                                    ORDERED BY: HQIONGEX, SANDE  SOURCE: Biopsy LEFT PLEURAL  WITH DEBRIS              COLLECTED:  04/22/18 20:40  ANTIBIOTICS AT COLL.:                                RECEIVED :  04/23/18 02:30  [id number] called Micro Results of pos fungal stain. Results read back [id number], by [id number] on 04/23/2018 at 11:26  Stain, Fungal                              FINAL       04/23/18 11:26   +  04/23/18   Many Septate Hyphae Seen  Culture Fungus                             PENDING      Legionella antigen, urine [[id number]] Collected:  04/20/18 2004    Specimen:  Urine, Catheterized, Foley Updated:  04/21/18 0403    Narrative:       ORDER#: [id number]                                    ORDERED BY: Geraldine Solar  SOURCE: Urine, Catheterized, Foley                   COLLECTED:  04/20/18 20:04  ANTIBIOTICS AT COLL.:                                RECEIVED :  04/20/18 23:45  Legionella, Rapid Urinary Antigen          FINAL       04/21/18 04:03  04/21/18   Negative for Legionella pneumophila Serogroup 1 Antigen  Limitations of Test:             1. Negative results do not exclude infection with Legionella                pneumophila Serogroup 1.             2. Does not detect other serogroups of L. pneumophila                or other Legionella species.             Test Reference Range: Negative      Presurgical Surveillance, MSSA+MRSA [[id number]] Collected:  04/20/18 1630    Specimen:  Nares Updated:  04/21/18 1619     Culture Staph and MRSA Surveillance --     No Staph aureus isolated and  No Methicillin Resistant Staph aureus isolated      Narrative:       Please swab nares and throat    Presurgical Surveillance, MSSA+MRSA [[id number]] Collected:  04/20/18 1630    Specimen:  Throat Updated:  04/21/18 1619     Culture Staph and MRSA Surveillance --     No Staph aureus isolated and  No Methicillin Resistant Staph aureus isolated      S. PNEUMONIAE, RAPID URINARY ANTIGEN [[id number]] Collected:  04/20/18 2004    Specimen:  Urine, Clean Catch Updated:  04/21/18 0402    Narrative:        ORDER#: [id number]                                    ORDERED BY: Geraldine Solar  SOURCE: Urine, Clean Catch                           COLLECTED:  04/20/18 20:04  ANTIBIOTICS AT COLL.:                                RECEIVED :  04/20/18 23:45  S. pneumoniae, Rapid Urinary Antigen       FINAL       04/21/18 04:02  04/21/18   Negative for Streptococcus pneumoniae Urinary Antigen             Note:             This is a presumptive test for the direct qualitative             detection of bacterial antigen. This test is not intended as             a substitute for a gram stain and bacterial culture. Samples             with extremely low levels of antigen may yield negative             results.             Reference Range: Negative      Wound culture & gram stain [[id number]] Collected:  04/22/18 2122    Specimen:  Wound from Abscess Updated:  04/27/18 1226    Narrative:       ORDER#: [id number]                                    ORDERED BY: Dorothey Baseman,  SANDE  SOURCE: Abscess LEFT ABCESS CAVITY                   COLLECTED:  04/22/18 21:22  ANTIBIOTICS AT COLL.:                                RECEIVED :  04/23/18 02:26  Stain, Gram                                FINAL       04/23/18 03:42  04/23/18   No Squamous epithelial cells seen             Moderate WBCs             No organisms seen  Culture and Gram Stain, Aerobic, Wound     PRELIM      04/27/18 12:26   +  04/24/18   Culture no growth to date, Final report to follow  04/25/18   Light growth of Mycelial Fungus               Refer to Identification on culture #[id number]        Wound culture & gram stain [[id number]] Collected:  04/22/18 2040    Specimen:  Wound Updated:  04/27/18 1226    Narrative:       ORDER#: [id number]                                    ORDERED BY: Dorothey Baseman, SANDE  SOURCE: Wound LEFT PLEURAL WITH DEBRIS               COLLECTED:  04/22/18 20:40  ANTIBIOTICS AT COLL.:                                RECEIVED :  04/23/18 02:30  Stain, Gram                                 FINAL       04/23/18 05:06  04/23/18   No Squamous epithelial cells seen             Many WBCs             No organisms seen  Culture and Gram Stain, Aerobic, Wound     PRELIM      04/27/18 12:26   +  04/24/18   Culture no growth to date, Final report to follow  04/25/18   Moderate growth of Mycelial Fungus               Identification to follow              Rads:   X-ray Chest Ap Portable    Result Date: 04/30/2018  . Stable to minimally increased residual left pneumothorax given rotation Marty Heck, MD 04/30/2018 9:20 AM      Signed by: Darlina Guys, NP

## 2018-04-30 NOTE — UM Notes (Signed)
Inpatient CSR 04/30/2018    23 y.o. male with left lung abscess and empyema s/p pigtail 2/27 with a persistent left hydropneumothorax now 7 Days Post-Op s/p left VATS, decortication. +Fungus from pleural cultures. Continues to have air leak.    Thoracic surgery note:  Interval History:   NAEON. Ambulated >60 laps    Plan:   -- continue left chest tube to continuous suction - , even when ambulating  -- continue ertapenem & voriconazole per ID.   -- daily morning chest x-ray  -- tylenol & PRN tramadol for pain control  -- Encourage aggressive pulm toilet with ambulation, IS, PEP/PAP therapy, duo nebs    3/12 CXR: Stable to minimally increased residual left pneumothorax given rotation    Labs: Glu 67, Alk phos 111    VS: T97.2, P90, R16, 133/87, 98%      Gillian Scarce  UR Case Manager, MSN, RN  [employer]  [phone] (voicemail)  [email or web address]

## 2018-04-30 NOTE — Plan of Care (Signed)
Pt Aox4.   RA-encouraged Is use.   No tele. VSS. Pt denies c/p, SOB.   Voiding urine freely.   CT to L side. Cont suction. Bubbling, but Mds aware. Dressing c/d/I. Scant old drainage.   OOB w. No assist., ambulating freely throughout hallways.   Low falls risk. Table at bedside. Call bell within reach. WCTM.     Problem: Moderate/High Fall Risk Score >5  Goal: Patient will remain free of falls  Outcome: Progressing  Flowsheets (Taken 04/29/2018 2100 by Lynne Leader, RN)  Moderate Risk (6-13): LOW-Fall Interventions Appropriate for Low Fall Risk     Problem: Safety  Goal: Patient will be free from injury during hospitalization  Outcome: Progressing  Flowsheets (Taken 04/30/2018 1449)  Patient will be free from injury during hospitalization : Assess patient's risk for falls and implement fall prevention plan of care per policy; Provide and maintain safe environment; Hourly rounding  Goal: Patient will be free from infection during hospitalization  Outcome: Progressing  Flowsheets (Taken 04/30/2018 1449)  Free from Infection during hospitalization: Assess and monitor for signs and symptoms of infection     Problem: Discharge Barriers  Goal: Patient will be discharged home or other facility with appropriate resources  Outcome: Progressing  Flowsheets (Taken 04/30/2018 1449)  Discharge to home or other facility with appropriate resources: Provide appropriate patient education     Problem: Inadequate Gas Exchange  Goal: Adequate oxygenation and improved ventilation  Outcome: Progressing  Flowsheets (Taken 04/30/2018 1449)  Adequate oxygenation and improved ventilation: Assess lung sounds; Increase activity as tolerated/progressive mobility; Teach/reinforce use of incentive spirometer 10 times per hour while awake, cough and deep breath as needed  Goal: Patent Airway maintained  Outcome: Progressing  Flowsheets (Taken 04/30/2018 1449)  Patent airway maintained : Reinforce use of ordered respiratory interventions (i.e.  CPAP, BiPAP, Incentive Spirometer, Acapella, etc.)     Problem: Inadequate Airway Clearance  Goal: Normal respiratory rate/effort achieved/maintained  Outcome: Progressing  Flowsheets (Taken 04/30/2018 1449)  Normal respiratory rate/effort achieved/maintained: Plan activities to conserve energy: plan rest periods     Problem: Neurological Deficit  Goal: Neurological status is stable or improving  Outcome: Progressing  Flowsheets (Taken 04/30/2018 1449)  Neurological status is stable or improving: Observe for seizure activity and initiate seizure precautions if indicated     Problem: Peripheral Neurovascular Impairment  Goal: Extremity color, movement, sensation are maintained or improved  Outcome: Progressing  Flowsheets (Taken 04/30/2018 1449)  Extremity color, movement, sensation are maintained or improved : Increase mobility as tolerated/progressive mobility; Assess extremity for proper alignment; Teach/review/reinforce ankle pump exercises

## 2018-05-01 ENCOUNTER — Inpatient Hospital Stay: Payer: BLUE CROSS/BLUE SHIELD

## 2018-05-01 NOTE — Progress Notes (Signed)
Thoracic Surgery Daily Progress Note  [facility] - Thoracic Surgery   Team Spectra: [phone] / [phone]    Hospital Day: Hospital Day: 16  Post Operative Day: 9 Days Post-Op   Primary Procedure: Procedure(s) (LRB):  THORACOSCOPY, (VATS), DECORTICATION (Left)     Assessment:   Craig Phelps is a 23 y.o. male with left lung abscess and empyema s/p pigtail 2/27 with a persistent left hydropneumothorax now 9 Days Post-Op s/p left VATS, decortication. Peural cultures w/ Mycelial fungus. Continues to have air leak.    Plan:   -- continue left chest tube to continuous suction - , even when ambulating  -- continue ertapenem & voriconazole per ID.   -- daily morning chest x-ray  -- tylenol & PRN tramadol for pain control  -- Encourage aggressive pulm toilet with ambulation, IS, PEP/PAP therapy, duo nebs     Interval History:   NAEON    Physical Exam:     Vitals:    04/30/18 1214 04/30/18 1608 04/30/18 2044 04/30/18 2303   BP: 133/87 112/87 115/61 102/58   Pulse: 90 (!) 101 (!) 108 100   Resp: 16 16 17 17    Temp: 97.2 F (36.2 C) 97.2 F (36.2 C) 97.6 F (36.4 C) (!) 96.4 F (35.8 C)   TempSrc: Oral Axillary Oral Oral   SpO2: 98% 98% 100% 98%   Weight:       Height:            PHYSICAL EXAM:  General: AAOx3, NAD  HEENT: Atraumatic, normocephalic, PERRLA  CV: RRR  Pulm: CTAB, good respiratory effort    Lines/Drains:   Left chest tube: 105cc w/ SS output    Laboratory and Radiology Results:     Recent Labs   Lab 04/30/18  1213 04/28/18  0815   WBC  --  11.77*   RBC  --  4.70   Hgb  --  12.3*   Hematocrit  --  38.2   Sodium 140 139   Potassium 4.7 4.5   Chloride 103 104   CO2 22 23   BUN 14.0 13.0   Creatinine 0.9 0.8   Calcium 10.3 9.2   Magnesium 2.3 1.9   Glucose 67* 81                 X-ray Chest Ap Portable    Result Date: 05/01/2018  1. Stable left hydropneumothorax lung compared to 04/30/2018. Olen Pel, MD 05/01/2018 6:21 AM

## 2018-05-01 NOTE — Plan of Care (Signed)
Problem: Moderate/High Fall Risk Score >5  Goal: Patient will remain free of falls  Outcome: Progressing  Flowsheets  Taken 04/29/2018 2100 by Lynne Leader, RN  Moderate Risk (6-13): LOW-Fall Interventions Appropriate for Low Fall Risk  High (Greater than 13): LOW-Fall Interventions Appropriate for Low Fall Risk  Taken 04/27/2018 0932 by Milagros Evener, RN  VH Moderate Risk (6-13): Use of floor mat;Use assistive devices  Taken 04/28/2018 2143 by Fabian Sharp, RN  VH High Risk (Greater than 13): BED ALARM WILL BE ACTIVATED WHEN THE PATEINT IS IN BED WITH SIGNAGE "RESET BED ALARM";Use assistive devices;Use of floor mat;Keep door open for better visibility     Problem: Safety  Goal: Patient will be free from injury during hospitalization  Outcome: Progressing  Flowsheets (Taken 04/30/2018 1449 by Haze Boyden, RN)  Patient will be free from injury during hospitalization : Assess patient's risk for falls and implement fall prevention plan of care per policy;Provide and maintain safe environment;Hourly rounding  Goal: Patient will be free from infection during hospitalization  Outcome: Progressing  Flowsheets (Taken 04/30/2018 1449 by Haze Boyden, RN)  Free from Infection during hospitalization: Assess and monitor for signs and symptoms of infection     Problem: Discharge Barriers  Goal: Patient will be discharged home or other facility with appropriate resources  Outcome: Progressing  Flowsheets (Taken 04/30/2018 1449 by Haze Boyden, RN)  Discharge to home or other facility with appropriate resources: Provide appropriate patient education     Problem: Inadequate Gas Exchange  Goal: Adequate oxygenation and improved ventilation  Outcome: Progressing  Flowsheets (Taken 04/30/2018 1449 by Haze Boyden, RN)  Adequate oxygenation and improved ventilation: Assess lung sounds;Increase activity as tolerated/progressive mobility;Teach/reinforce use of incentive spirometer 10 times per hour while awake,  cough and deep breath as needed  Goal: Patent Airway maintained  Outcome: Progressing  Flowsheets (Taken 04/30/2018 1449 by Haze Boyden, RN)  Patent airway maintained : Reinforce use of ordered respiratory interventions (i.e. CPAP, BiPAP, Incentive Spirometer, Acapella, etc.)     Problem: Inadequate Airway Clearance  Goal: Normal respiratory rate/effort achieved/maintained  Outcome: Progressing  Flowsheets (Taken 04/30/2018 1449 by Haze Boyden, RN)  Normal respiratory rate/effort achieved/maintained: Plan activities to conserve energy: plan rest periods     Problem: Neurological Deficit  Goal: Neurological status is stable or improving  Outcome: Progressing  Flowsheets (Taken 04/30/2018 1449 by Haze Boyden, RN)  Neurological status is stable or improving: Observe for seizure activity and initiate seizure precautions if indicated     Problem: Peripheral Neurovascular Impairment  Goal: Extremity color, movement, sensation are maintained or improved  Outcome: Progressing  Flowsheets (Taken 04/30/2018 1449 by Haze Boyden, RN)  Extremity color, movement, sensation are maintained or improved : Increase mobility as tolerated/progressive mobility;Assess extremity for proper alignment;Teach/review/reinforce ankle pump exercises     Patient AOx4. Denies n/v, SOB, CP. Vitals stable.  Pain controlled by PRN pain meds with good effect  Chest tube dressing intact, pt requesting for dressing to be changed in the AM.  Tolerating regular diet well. Powerade encouraged.  Patient ambulated >40 laps today  Encouraged use of IS. Daily CXRs. Scheduled duonebs.  Patient requests to bundle his care, doesn't want to be disturbed from 2300-0500  Will continue to monitor.

## 2018-05-01 NOTE — Plan of Care (Addendum)
Problem: Moderate/High Fall Risk Score >5  Goal: Patient will remain free of falls  Outcome: Progressing     Problem: Safety  Goal: Patient will be free from injury during hospitalization  Outcome: Progressing  Patient will be free from injury during hospitalization : Assess patient's risk for falls and implement fall prevention plan of care per policy;Provide and maintain safe environment;Hourly rounding  Goal: Patient will be free from infection during hospitalization  Outcome: Progressing  Free from Infection during hospitalization: Assess and monitor for signs and symptoms of infection     Problem: Discharge Barriers  Goal: Patient will be discharged home or other facility with appropriate resources  Outcome: Progressing  Discharge to home or other facility with appropriate resources: Provide appropriate patient education     Problem: Inadequate Gas Exchange  Goal: Adequate oxygenation and improved ventilation  Outcome: Progressing  Adequate oxygenation and improved ventilation: Assess lung sounds;Increase activity as tolerated/progressive mobility;Teach/reinforce use of incentive spirometer 10 times per hour while awake, cough and deep breath as needed  Goal: Patent Airway maintained  Outcome: Progressing  Patent airway maintained : Reinforce use of ordered respiratory interventions (i.e. CPAP, BiPAP, Incentive Spirometer, Acapella, etc.)     Problem: Inadequate Airway Clearance  Goal: Normal respiratory rate/effort achieved/maintained  Outcome: Progressing  Normal respiratory rate/effort achieved/maintained: Plan activities to conserve energy: plan rest periods     Problem: Neurological Deficit  Goal: Neurological status is stable or improving  Outcome: Progressing  Neurological status is stable or improving: Observe for seizure activity and initiate seizure precautions if indicated     Problem: Peripheral Neurovascular Impairment  Goal: Extremity color, movement, sensation are maintained or  improved  Outcome: Progressing  Extremity color, movement, sensation are maintained or improved : Increase mobility as tolerated/progressive mobility;Assess extremity for proper alignment;Teach/review/reinforce ankle pump exercises        A/O x4. VSS. Afebrile. On RA.   Chest tube to wall suction, -20cmH2O. Yellow color output.   Ambulates and uses IS.   Complained of pain to chest tube and back side, notified Team and given Tramadol. Thoracic team repositioned chest tube and pt stated feel pain.   Tolerated with meals, denied nausea.   Diminished lung sounds at bases, denied SOB or respiratory distress.   Getting antifungal po med and IV abx.   Continue to monitor

## 2018-05-01 NOTE — Plan of Care (Addendum)
POD#10  S/P Thoracoscopy (VATS)    Major Shift Events:NONE    Orientation: AOx4, follow commands, know his need. In good spirit.  Rhythm on tele: none  Cardiac:110- 120's , HR in 80's  Respiratory:RA, Regular easy and unlabored   Ambulation:Independent.  Pain:L side chest. Incision site  Lines/Drips:PIV RUA,- saline locked CT L chest  GI/GU:Continent in both bowel/bladder, voiding freely.  BM this shift?  Skin:incision left chest.   Braden Score:   Fall Score:Low  Does the patient require an Interpreter?No.      Comments:  VSS, no fever ,   On CT wall continuous suction. Dressing remain CDI -20 cm H20. + for air leak  IS- 3500  Tolerating regular diet  He denies sob/nv/ha/cp  Pain 3/10 - PRN pain med's well controled.   Refused Kepra IVIV antibiotics- IV ertapenem  Antifungal - VFEND PO    PLAN:  Vitals Q4H:  IS   Daily CXR  Call MD with questions or concerns.      Problem: Moderate/High Fall Risk Score >5  Goal: Patient will remain free of falls  Outcome: Progressing  Flowsheets (Taken 04/29/2018 2100 by Lynne Leader, RN)  Moderate Risk (6-13): LOW-Fall Interventions Appropriate for Low Fall Risk     Problem: Safety  Goal: Patient will be free from injury during hospitalization  Outcome: Progressing  Flowsheets (Taken 05/01/2018 2322)  Patient will be free from injury during hospitalization : Assess patient's risk for falls and implement fall prevention plan of care per policy; Ensure appropriate safety devices are available at the bedside; Provide and maintain safe environment; Include patient/ family/ care giver in decisions related to safety; Use appropriate transfer methods; Hourly rounding  Goal: Patient will be free from infection during hospitalization  Outcome: Progressing  Flowsheets (Taken 05/01/2018 2322)  Free from Infection during hospitalization: Assess and monitor for signs and symptoms of infection; Monitor lab/diagnostic results; Encourage patient and family to  use good hand hygiene technique     Problem: Discharge Barriers  Goal: Patient will be discharged home or other facility with appropriate resources  Outcome: Progressing  Flowsheets (Taken 05/01/2018 2322)  Discharge to home or other facility with appropriate resources: Provide appropriate patient education; Provide information on available health resources; Initiate discharge planning     Problem: Inadequate Gas Exchange  Goal: Adequate oxygenation and improved ventilation  Outcome: Progressing  Flowsheets (Taken 05/01/2018 2322)  Adequate oxygenation and improved ventilation: Assess lung sounds; Provide mechanical and oxygen support to facilitate gas exchange; Teach/reinforce use of incentive spirometer 10 times per hour while awake, cough and deep breath as needed; Plan activities to conserve energy: plan rest periods  Goal: Patent Airway maintained  Outcome: Progressing  Flowsheets (Taken 05/01/2018 2322)  Patent airway maintained : Position patient for maximum ventilatory efficiency; Reinforce use of ordered respiratory interventions (i.e. CPAP, BiPAP, Incentive Spirometer, Acapella, etc.); Reposition patient every 2 hours and as needed unless able to self-reposition     Problem: Inadequate Airway Clearance  Goal: Normal respiratory rate/effort achieved/maintained  Outcome: Progressing  Flowsheets (Taken 05/01/2018 2322)  Normal respiratory rate/effort achieved/maintained: Plan activities to conserve energy: plan rest periods     Problem: Neurological Deficit  Goal: Neurological status is stable or improving  Outcome: Progressing  Flowsheets (Taken 05/01/2018 2322)  Neurological status is stable or improving: Perform CAM Assessment     Problem: Peripheral Neurovascular Impairment  Goal: Extremity color, movement, sensation are maintained or improved  Outcome: Progressing  Flowsheets (Taken 05/01/2018 2322)  Extremity color, movement, sensation are maintained or improved : Increase mobility as tolerated/progressive  mobility; Teach/review/reinforce ankle pump exercises

## 2018-05-02 ENCOUNTER — Inpatient Hospital Stay: Payer: BLUE CROSS/BLUE SHIELD

## 2018-05-02 NOTE — Progress Notes (Signed)
ID PROGRESS NOTE      Spectra: [phone]    Office: [phone]    Date Time: 05/02/18 @NOW   Patient Name: Craig Phelps, Craig Phelps    Problem List:      Acute:  Subacute pneumonitis, likely aspiration related as the patient mentions of GERD and frequent alcohol intoxication with vomiting episodes.  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 Pleural material now noted to contain hyphae.Hx of frequent vomiting, including due to excesses of alcohol. No evidence of immunosuppression. The most likely chain of events is a cavity which formed due to aspiration becoming superinfected with fungal organism and leading to rupture into the pleural space.    Multiple cultures with fungus. ID pending. We will plan on a 4 week course of an antifungal and of Ertapenem to cover the possible bacterial component of the infection. He will stay in house for pulmonary toileting through the weekend.    Antimicrobials:   abx #17  #13 Ertapenem 1 gram IV daily  #9 Voriconazole 200mg  PO BID    Plan:     Continue Ertapenem for possible bacterial component  Continue Voriconazole  ID of the fungus pending        IDP Continuum of Care Program  [id number]  This patient is being evaluated for the need for intravenous antibiotics after discharge    Diagnosis requiring antibiotics: Empyema    ANTIBIOTIC RECOMMENDATION/DISCHARGE PLANNING  Recommendation for Definitive outpatient Antibiotic:  Ertapenem 1 gm IV daily until 05/17/18   Weekly CBC, CMP   Discharge Destination:    Home with family    Discharge Planning:       ID requirements for Discharge: Line, home IVAB to be set up    Follow up ID appointment placed: Pt will call    Discussed with Case Management: no   Home Health Face-to-Face Placed: 04/29/18    IV ACCESS RECOMMENDATIONS: IV Access Status: will order   Preferred Post Discharge IV Access: midline      For any questions regarding home outpatient antibiotics, please call [phone]      Lines:     Patient Lines/Drains/Airways Status    Active Lines, Drains and Airways     Name:   Placement date:   Placement time:   Site:   Days:    Peripheral IV 04/25/18 Left Antecubital   04/25/18    2146    Antecubital   6    Chest Tube 1 Other (Comment) 28 Fr.   04/22/18    2210    --   9                Family History:     Family History   Problem Relation Age of Onset    ADD / ADHD Father     ADD / ADHD Brother     Cancer Paternal Grandfather 63        bladder, smoker    Pulmonary fibrosis Maternal Grandmother         also  with ITP    Alzheimer's disease Paternal Grandmother 3       Social History:     Social History     Socioeconomic History    Marital status: Single     Spouse name: Not on file    Number of children: 0    Years of education: Not on file    Highest education level: Not on file   Occupational History    Occupation: Archivist   Social Needs    Financial resource strain: Not on file    Food insecurity:     Worry: Not on file     Inability: Not on file    Transportation needs:     Medical: Not on file     Non-medical: Not on file   Tobacco Use    Smoking status: Former Smoker    Smokeless tobacco: Never Used    Tobacco comment: quit 1 week ago   Substance and Sexual Activity    Alcohol use: Not Currently     Alcohol/week: 0.0 standard drinks    Drug use: No    Sexual activity: Yes     Partners: Female     Comment: monogamous   Lifestyle    Physical activity:     Days per week: Not on file     Minutes per session: Not on file    Stress: Not on  file   Relationships    Social connections:     Talks on phone: Not on file     Gets together: Not on file     Attends religious service: Not on file     Active member of club or organization: Not on file     Attends meetings of clubs or organizations: Not on file     Relationship status: Not on file    Intimate partner violence:     Fear of current or ex partner: Not on file     Emotionally abused: Not on file     Physically abused: Not on file     Forced sexual activity: Not on file   Other Topics Concern    Not on file   Social History Narrative    Taking a break from school    Wanting to transfer for the fall    Working at [employer]        Exercise: no     Diet: nothing special, tries to avoid GERD inducing foods       Allergies:     Allergies   Allergen Reactions    Other      Environmental allergies       Review of Systems:   Pt denies fevers, chills, myalgias, nausea, diarrhea, dyspnea.    Physical Exam:     Vitals:    05/02/18 1035   BP:    Pulse:    Resp: 18   Temp:    SpO2: 99%       General Appearance: alert and appropriate, non-toxic  Neuro: alert, oriented, normal speech, normal attention and cognition  HEENT: no scleral icterus, pupils round and reactive, OP clear  Neck: supple  Lungs: clear to auscultation, no wheezes, rales or rhonchi, left CT   Cardiac: normal rate, regular rhythm, normal S1, S2,   Abdomen: soft, non-tender, non-distended, normal active bowel sounds  Extremities: no pedal edema  Skin: no rash  Psych: normal mood and affect    Labs:     Lab Results  Component Value Date    WBC 11.77 (H) 04/28/2018    HGB 12.3 (L) 04/28/2018    HCT 38.2 04/28/2018    MCV 81.3 04/28/2018    PLT 659 (H) 04/28/2018     Lab Results   Component Value Date    CREAT 0.9 04/30/2018     Lab Results   Component Value Date    ALT 16 04/30/2018    AST 17 04/30/2018    ALKPHOS 111 (H) 04/30/2018    BILITOTAL 0.3 04/30/2018     No results found for: LACTATE    Microbiology:     Microbiology Results      Procedure Component Value Units Date/Time    AFB Culture & Smear [[id number]] Collected:  04/19/18 1053    Specimen:  Sputum from Pleural Fluid Updated:  04/27/18 2006    Narrative:       ORDER#: [id number]                                    ORDERED BY: Alinda Money  SOURCE: Pleural Fluid left                           COLLECTED:  04/19/18 10:53  ANTIBIOTICS AT COLL.:                                RECEIVED :  04/19/18 15:56  Stain, Acid Fast                           FINAL       04/19/18 19:10  04/19/18   No Acid Fast Bacillus Seen  Culture Acid Fast Bacillus (AFB)           PRELIM      04/27/18 20:04  04/27/18   No growth after 1 week/s of incubation.      AFB culture and smear [[id number]] Collected:  04/22/18 2122    Specimen:  Sputum from Tissue, Deep Updated:  04/23/18 1132    Narrative:       ORDER#: [id number]                                    ORDERED BY: Dorothey Baseman, SANDE  SOURCE: Tissue, Deep LEFT ABCESS CAVITY              COLLECTED:  04/22/18 21:22  ANTIBIOTICS AT COLL.:                                RECEIVED :  04/23/18 02:26  Stain, Acid Fast                           FINAL       04/23/18 11:32  04/23/18   No Acid Fast Bacillus Seen  Culture Acid Fast Bacillus (AFB)           PENDING      AFB culture and smear [[id number]] Collected:  04/22/18 2040    Specimen:  Sputum from Tissue, Deep Updated:  04/23/18 1132    Narrative:       ORDER#: [id number]  ORDERED BY: KHANDHAR, SANDE  SOURCE: Tissue, Deep LEFT PLEURAL WITH DEBRIS        COLLECTED:  04/22/18 20:40  ANTIBIOTICS AT COLL.:                                RECEIVED :  04/23/18 02:30  Stain, Acid Fast                           FINAL       04/23/18 11:32  04/23/18   No Acid Fast Bacillus Seen  Culture Acid Fast Bacillus (AFB)           PENDING      Anaerobic culture [[id number]] Collected:  04/22/18 2040    Specimen:  Other from Tissue Updated:  04/27/18 0729    Narrative:       ORDER#: [id number]                                     ORDERED BY: Dorothey Baseman, SANDE  SOURCE: Tissue LEFT PLEURAL WITH DEBRIS              COLLECTED:  04/22/18 20:40  ANTIBIOTICS AT COLL.:                                RECEIVED :  04/23/18 02:30  Culture, Anaerobic Bacteria                FINAL       04/27/18 07:29  04/27/18   No anaerobic growth      Anaerobic culture [[id number]] Collected:  04/22/18 2122    Specimen:  Other from Abscess Updated:  04/25/18 1144    Narrative:       ORDER#: [id number]                                    ORDERED BY: Dorothey Baseman, SANDE  SOURCE: Abscess LEFT ABCESS CAVITY                   COLLECTED:  04/22/18 21:22  ANTIBIOTICS AT COLL.:                                RECEIVED :  04/23/18 02:26  Culture, Anaerobic Bacteria                PRELIM      04/25/18 11:44  04/25/18   No growth to date, final report to follow      CULTURE + Dierdre Forth [[id number]] Collected:  04/17/18 1532    Specimen:  Sputum, Induced Updated:  04/19/18 1829    Narrative:       ORDER#: [id number]                                    ORDERED BY: Timothy Lasso  SOURCE: Sputum, Induced induce sputum                COLLECTED:  04/17/18 15:32  ANTIBIOTICS AT COLL.:  RECEIVED :  04/17/18 18:14  Stain, Gram (Respiratory)                  FINAL       04/17/18 19:57  04/17/18   Moderate WBC's             Few Squamous epithelial cells             Moderate Mixed Respiratory Flora  Culture and Gram Stain, Aerobic, RespiratorFINAL       04/19/18 18:29  04/19/18   Moderate growth of mixed upper respiratory flora      Culture + Gram Stain,Aerobic, Body Fluid [[id number]] Collected:  04/19/18 1053    Specimen:  Body Fluid from Pleural Fluid Updated:  04/23/18 1525    Narrative:       ORDER#: [id number]                                    ORDERED BY: Alinda Money  SOURCE: Pleural Fluid left                           COLLECTED:  04/19/18 10:53  ANTIBIOTICS AT COLL.:                                RECEIVED :  04/19/18 15:55  Stain,  Gram                                FINAL       04/19/18 18:12  04/19/18   Moderate WBCs             No organisms seen  Culture and Gram Stain, Aerobic, Body FluidFINAL       04/23/18 15:25  04/23/18   No growth      Fungus culture [[id number]] Collected:  04/22/18 2122    Specimen:  Other from Tissue Updated:  04/23/18 1130    Narrative:       ORDER#: [id number]                                    ORDERED BY: Dorothey Baseman, SANDE  SOURCE: Tissue LEFT ABCESS CAVITY                    COLLECTED:  04/22/18 21:22  ANTIBIOTICS AT COLL.:                                RECEIVED :  04/23/18 02:26  Stain, Fungal                              FINAL       04/23/18 11:29  04/23/18   No Fungal or Yeast Elements Seen  Culture Fungus                             PENDING      Fungus culture [[id number]] Collected:  04/22/18 2040    Specimen:  Other from Biopsy Updated:  04/23/18 1126    Narrative:  [id number] called Micro Results of pos fungal stain. Results read back [id number],  by [id number] on 04/23/2018 at 11:26  ORDER#: [id number]                                    ORDERED BY: FAOZHYQM, SANDE  SOURCE: Biopsy LEFT PLEURAL WITH DEBRIS              COLLECTED:  04/22/18 20:40  ANTIBIOTICS AT COLL.:                                RECEIVED :  04/23/18 02:30  [id number] called Micro Results of pos fungal stain. Results read back [id number], by [id number] on 04/23/2018 at 11:26  Stain, Fungal                              FINAL       04/23/18 11:26   +  04/23/18   Many Septate Hyphae Seen  Culture Fungus                             PENDING      Legionella antigen, urine [[id number]] Collected:  04/20/18 2004    Specimen:  Urine, Catheterized, Foley Updated:  04/21/18 0403    Narrative:       ORDER#: [id number]                                    ORDERED BY: Geraldine Solar  SOURCE: Urine, Catheterized, Foley                   COLLECTED:  04/20/18 20:04  ANTIBIOTICS AT COLL.:                                RECEIVED :  04/20/18 23:45  Legionella, Rapid Urinary  Antigen          FINAL       04/21/18 04:03  04/21/18   Negative for Legionella pneumophila Serogroup 1 Antigen             Limitations of Test:             1. Negative results do not exclude infection with Legionella                pneumophila Serogroup 1.             2. Does not detect other serogroups of L. pneumophila                or other Legionella species.             Test Reference Range: Negative      Presurgical Surveillance, MSSA+MRSA [[id number]] Collected:  04/20/18 1630    Specimen:  Nares Updated:  04/21/18 1619     Culture Staph and MRSA Surveillance --     No Staph aureus isolated and  No Methicillin Resistant Staph aureus isolated      Narrative:       Please swab nares and throat    Presurgical Surveillance, MSSA+MRSA [[id number]] Collected:  04/20/18 1630    Specimen:  Throat Updated:  04/21/18 1619     Culture Staph and MRSA Surveillance --     No Staph aureus isolated and  No Methicillin Resistant Staph aureus isolated      S. PNEUMONIAE, RAPID URINARY ANTIGEN [[id number]] Collected:  04/20/18 2004    Specimen:  Urine, Clean Catch Updated:  04/21/18 0402    Narrative:       ORDER#: [id number]                                    ORDERED BY: Geraldine Solar  SOURCE: Urine, Clean Catch                           COLLECTED:  04/20/18 20:04  ANTIBIOTICS AT COLL.:                                RECEIVED :  04/20/18 23:45  S. pneumoniae, Rapid Urinary Antigen       FINAL       04/21/18 04:02  04/21/18   Negative for Streptococcus pneumoniae Urinary Antigen             Note:             This is a presumptive test for the direct qualitative             detection of bacterial antigen. This test is not intended as             a substitute for a gram stain and bacterial culture. Samples             with extremely low levels of antigen may yield negative             results.             Reference Range: Negative      Wound culture & gram stain [[id number]] Collected:  04/22/18 2122    Specimen:  Wound from  Abscess Updated:  04/27/18 1226    Narrative:       ORDER#: [id number]                                    ORDERED BY: Dorothey Baseman, SANDE  SOURCE: Abscess LEFT ABCESS CAVITY                   COLLECTED:  04/22/18 21:22  ANTIBIOTICS AT COLL.:                                RECEIVED :  04/23/18 02:26  Stain, Gram                                FINAL       04/23/18 03:42  04/23/18   No Squamous epithelial cells seen             Moderate WBCs             No organisms seen  Culture and Gram Stain, Aerobic, Wound     PRELIM      04/27/18 12:26   +  04/24/18   Culture no growth to date, Final report  to follow  04/25/18   Light growth of Mycelial Fungus               Refer to Identification on culture #[id number]        Wound culture & gram stain [[id number]] Collected:  04/22/18 2040    Specimen:  Wound Updated:  04/27/18 1226    Narrative:       ORDER#: [id number]                                    ORDERED BY: Dorothey Baseman, SANDE  SOURCE: Wound LEFT PLEURAL WITH DEBRIS               COLLECTED:  04/22/18 20:40  ANTIBIOTICS AT COLL.:                                RECEIVED :  04/23/18 02:30  Stain, Gram                                FINAL       04/23/18 05:06  04/23/18   No Squamous epithelial cells seen             Many WBCs             No organisms seen  Culture and Gram Stain, Aerobic, Wound     PRELIM      04/27/18 12:26   +  04/24/18   Culture no growth to date, Final report to follow  04/25/18   Moderate growth of Mycelial Fungus               Identification to follow              Rads:   X-ray Chest Ap Portable    Result Date: 05/02/2018  There has been no significant interval change. Stephannie Peters, MD 05/02/2018 6:56 AM      Signed by: Aubery Lapping, NP

## 2018-05-02 NOTE — Plan of Care (Addendum)
POD#11S/P Thoracoscopy (VATS)    Major Shift Events:NONE    Orientation: AOx4, follow commands, know his need. In good spirit.  Rhythm on tele: none  Cardiac:110- 120's , HR in 80's  Respiratory:RA, Regular easy and unlabored           Ambulation:Independent.  Pain:L side chest. Incision site  Lines/Drips:PIV RUA,- saline locked CT L chest  GI/GU:Continent in both bowel/bladder, voiding freely.  BM this shift?  Skin:incision left chest.  Braden Score:   Fall Score:Low  Does the patient require an Interpreter?No.      Comments:  VSS, no fever ,   On CT wall continuous suction. Dressing remain CDI-15cm H20. + for air leak  IS- 3500  Tolerating regular diet  He denies sob/nv/ha/cp  Pain 3/10 - PRN pain med's well controled.   RefusedKepra IVIV antibiotics- IV ertapenem  Antifungal - VFEND PO    PLAN:  Vitals Q4H:  IS . Ambulate  Daily CXR  Plan for water seal and midline on Monday.   Call MD with questions or concerns.    Problem: Moderate/High Fall Risk Score >5  Goal: Patient will remain free of falls  Outcome: Progressing  Flowsheets (Taken 04/29/2018 2100 by Lynne Leader, RN)  High (Greater than 13): LOW-Fall Interventions Appropriate for Low Fall Risk     Problem: Safety  Goal: Patient will be free from injury during hospitalization  Outcome: Progressing  Flowsheets (Taken 05/01/2018 2322)  Patient will be free from injury during hospitalization : Assess patient's risk for falls and implement fall prevention plan of care per policy;Ensure appropriate safety devices are available at the bedside;Provide and maintain safe environment;Include patient/ family/ care giver in decisions related to safety;Use appropriate transfer methods;Hourly rounding  Goal: Patient will be free from infection during hospitalization  Outcome: Progressing  Flowsheets (Taken 05/01/2018 2322)  Free from Infection during hospitalization: Assess and monitor for signs and symptoms of infection;Monitor  lab/diagnostic results;Encourage patient and family to use good hand hygiene technique     Problem: Discharge Barriers  Goal: Patient will be discharged home or other facility with appropriate resources  Outcome: Progressing  Flowsheets (Taken 05/01/2018 2322)  Discharge to home or other facility with appropriate resources: Provide appropriate patient education;Provide information on available health resources;Initiate discharge planning     Problem: Inadequate Gas Exchange  Goal: Adequate oxygenation and improved ventilation  Outcome: Progressing  Flowsheets (Taken 05/01/2018 2322)  Adequate oxygenation and improved ventilation: Assess lung sounds;Provide mechanical and oxygen support to facilitate gas exchange;Teach/reinforce use of incentive spirometer 10 times per hour while awake, cough and deep breath as needed;Plan activities to conserve energy: plan rest periods  Goal: Patent Airway maintained  Outcome: Progressing  Flowsheets (Taken 05/01/2018 2322)  Patent airway maintained : Position patient for maximum ventilatory efficiency;Reinforce use of ordered respiratory interventions (i.e. CPAP, BiPAP, Incentive Spirometer, Acapella, etc.);Reposition patient every 2 hours and as needed unless able to self-reposition     Problem: Inadequate Airway Clearance  Goal: Normal respiratory rate/effort achieved/maintained  Outcome: Progressing  Flowsheets (Taken 05/01/2018 2322)  Normal respiratory rate/effort achieved/maintained: Plan activities to conserve energy: plan rest periods     Problem: Neurological Deficit  Goal: Neurological status is stable or improving  Outcome: Progressing  Flowsheets (Taken 05/01/2018 2322)  Neurological status is stable or improving: Perform CAM Assessment     Problem: Peripheral Neurovascular Impairment  Goal: Extremity color, movement, sensation are maintained or improved  Outcome: Progressing  Flowsheets (Taken 05/01/2018 2322)  Extremity color,  movement, sensation are maintained or improved  : Increase mobility as tolerated/progressive mobility;Teach/review/reinforce ankle pump exercises

## 2018-05-02 NOTE — Plan of Care (Addendum)
Problem: Safety  Goal: Patient will be free from injury during hospitalization  Outcome: Progressing  Patient will be free from injury during hospitalization : Assess patient's risk for falls and implement fall prevention plan of care per policy;Ensure appropriate safety devices are available at the bedside;Provide and maintain safe environment;Include patient/ family/ care giver in decisions related to safety;Use appropriate transfer methods;Hourly rounding  Goal: Patient will be free from infection during hospitalization  Outcome: Progressing  Free from Infection during hospitalization: Assess and monitor for signs and symptoms of infection;Monitor lab/diagnostic results;Encourage patient and family to use good hand hygiene technique     Problem: Discharge Barriers  Goal: Patient will be discharged home or other facility with appropriate resources  Outcome: Progressing  Discharge to home or other facility with appropriate resources: Provide appropriate patient education;Provide information on available health resources;Initiate discharge planning     Problem: Inadequate Gas Exchange  Goal: Adequate oxygenation and improved ventilation  Adequate oxygenation and improved ventilation: Assess lung sounds;Provide mechanical and oxygen support to facilitate gas exchange;Teach/reinforce use of incentive spirometer 10 times per hour while awake, cough and deep breath as needed;Plan activities to conserve energy: plan rest periods  Goal: Patent Airway maintained  Outcome: Progressing  Patent airway maintained : Position patient for maximum ventilatory   efficiency;Reinforce use of ordered respiratory interventions (i.e. CPAP, BiPAP, Incentive Spirometer, Acapella, etc.);Reposition patient every 2 hours and as needed unless able to self-reposition     Problem: Inadequate Airway Clearance  Goal: Normal respiratory rate/effort achieved/maintained  Outcome: Progressing  Normal respiratory rate/effort achieved/maintained:  Plan activities to conserve energy: plan rest periods    Problem: Peripheral Neurovascular Impairment  Goal: Extremity color, movement, sensation are maintained or improved  Outcome: Progressing  Extremity color, movement, sensation are maintained or improved : Increase mobility as tolerated/progressive mobility;Teach/review/reinforce ankle pump exercises       A/O x4. VSS. Afebrile. On RA.   Chest tube to wall suction, -20cmH2O. Yellow color output.   Ambulates and uses IS.   Tolerated with meals, denied nausea.    Diminished lung sounds at bases, denied SOB or respiratory distress.   Getting antifungal po med and IV abx.    Mild pain today, given Tylenol.   Continue to monitor

## 2018-05-02 NOTE — Progress Notes (Signed)
Thoracic Surgery Daily Progress Note  [facility] - Thoracic Surgery   Team Spectra: [phone] / [phone]    Hospital Day: Hospital Day: 17  Post Operative Day: 10 Days Post-Op   Primary Procedure: Procedure(s) (LRB):  THORACOSCOPY, (VATS), DECORTICATION (Left)     Assessment:   Craig Phelps is a 23 y.o. male with left lung abscess and empyema s/p pigtail 2/27 with a persistent left hydropneumothorax now 10 Days Post-Op s/p left VATS, decortication. Peural cultures w/ Mycelial fungus. Continues to have large multi-chamber air leak, Cxr unchanged from previous.    Plan:   -- continue left chest tube to continuous suction - , even when ambulating  -- continue ertapenem & voriconazole per ID. PICC line prior to discharge  -- daily morning chest x-ray  -- tylenol & PRN tramadol for pain control  -- Encourage aggressive pulm toilet with ambulation, IS, PEP/PAP therapy, duo nebs     Interval History:   NAEON -pt tolerating diet, normal GI function, pain well controlled. AFVSS    Physical Exam:     Vitals:    05/01/18 1139 05/01/18 1619 05/01/18 1949 05/01/18 2300   BP: 106/70 106/60 132/78 128/78   Pulse: 77 (!) 108 (!) 102 83   Resp: 16 16 16 18    Temp: 97.4 F (36.3 C) 97.2 F (36.2 C) (!) 96.5 F (35.8 C) 97.1 F (36.2 C)   TempSrc: Oral Oral Oral    SpO2: 99% 98% 100% 98%   Weight:       Height:            PHYSICAL EXAM:  General: AAOx3, NAD  HEENT: Atraumatic, normocephalic, PERRLA  CV: RRR  Pulm: CTAB, good respiratory effort, CT to WS, large multi-chamber air leak    Lines/Drains:   Left chest tube: 100cc w/ SS output    Laboratory and Radiology Results:     Recent Labs   Lab 04/30/18  1213 04/28/18  0815   WBC  --  11.77*   RBC  --  4.70   Hgb  --  12.3*   Hematocrit  --  38.2   Sodium 140 139   Potassium 4.7 4.5   Chloride 103 104   CO2 22 23   BUN 14.0 13.0   Creatinine 0.9 0.8   Calcium 10.3 9.2   Magnesium 2.3 1.9   Glucose 67* 81                 X-ray Chest Ap Portable    Result Date: 05/02/2018  There has  been no significant interval change. Stephannie Peters, MD 05/02/2018 6:56 AM

## 2018-05-02 NOTE — Progress Notes (Signed)
Thoracic team assessed pt and set chest tube wall suction with -15cmH2O.   Plan for water seal and midline on Monday.   Will keep PIV until Monday. Dr Dorothey Baseman aware.   Continue to monitor for any changes.

## 2018-05-03 ENCOUNTER — Inpatient Hospital Stay: Payer: BLUE CROSS/BLUE SHIELD

## 2018-05-03 NOTE — Progress Notes (Signed)
ID PROGRESS NOTE      Spectra: [phone]    Office: [phone]    Date Time: 05/03/18 @NOW   Patient Name: Craig Phelps, Craig Phelps    Problem List:      Acute:  Subacute pneumonitis, likely aspiration related as the patient mentions of GERD and frequent alcohol intoxication with vomiting episodes.  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 Pleural material now noted to contain hyphae.Hx of frequent vomiting, including due to excesses of alcohol. No evidence of immunosuppression. The most likely chain of events is a cavity which formed due to aspiration becoming superinfected with fungal organism and leading to rupture into the pleural space.    Multiple cultures with fungus. ID pending. We will plan on a 4 week course of an antifungal and of Ertapenem to cover the possible bacterial component of the infection. He will stay in house for pulmonary toileting through the weekend.    Antimicrobials:   abx #18  #14 Ertapenem 1 gram IV daily  #10 Voriconazole 200mg  PO BID    Plan:     Continue Ertapenem for possible bacterial component of infection  Continue Voriconazole  ID of the fungus pending  Will need a midline prior to discharge.  F2F in place.    [facility]  [phone]  This patient is being evaluated for the need for intravenous antibiotics after discharge    Diagnosis requiring antibiotics: Empyema    ANTIBIOTIC RECOMMENDATION/DISCHARGE PLANNING  Recommendation for Definitive outpatient Antibiotic:  Ertapenem 1 gm IV  daily until 05/17/18   Weekly CBC, CMP   Discharge Destination:   Home with family    Discharge Planning:       ID requirements for Discharge: Line, home IVAB to be set up    Follow up ID appointment placed: Pt will call    Discussed with Case Management: no   Home Health Face-to-Face Placed: 04/29/18    IV ACCESS RECOMMENDATIONS: IV Access Status: will order   Preferred Post Discharge IV Access: midline      For any questions regarding home outpatient antibiotics, please call [phone]      Lines:     Patient Lines/Drains/Airways Status    Active Lines, Drains and Airways     Name:   Placement date:   Placement time:   Site:   Days:    Peripheral IV 04/25/18 Left Antecubital   04/25/18    2146    Antecubital   7    Chest Tube 1 Other (Comment) 28 Fr.   04/22/18    2210    --   10                Family History:     Family History   Problem Relation Age of Onset    ADD / ADHD Father     ADD / ADHD Brother     Cancer Paternal Grandfather 10        bladder, smoker    Pulmonary fibrosis Maternal  Grandmother         also with ITP    Alzheimer's disease Paternal Grandmother 51       Social History:     Social History     Socioeconomic History    Marital status: Single     Spouse name: Not on file    Number of children: 0    Years of education: Not on file    Highest education level: Not on file   Occupational History    Occupation: Archivist   Social Needs    Financial resource strain: Not on file    Food insecurity:     Worry: Not on file     Inability: Not on file    Transportation needs:     Medical: Not on file     Non-medical: Not on file   Tobacco Use    Smoking status: Former Smoker    Smokeless tobacco: Never Used    Tobacco comment: quit 1 week ago   Substance and Sexual Activity    Alcohol use: Not Currently     Alcohol/week: 0.0 standard drinks    Drug use: No    Sexual activity: Yes     Partners: Female     Comment: monogamous   Lifestyle    Physical activity:     Days per week: Not  on file     Minutes per session: Not on file    Stress: Not on file   Relationships    Social connections:     Talks on phone: Not on file     Gets together: Not on file     Attends religious service: Not on file     Active member of club or organization: Not on file     Attends meetings of clubs or organizations: Not on file     Relationship status: Not on file    Intimate partner violence:     Fear of current or ex partner: Not on file     Emotionally abused: Not on file     Physically abused: Not on file     Forced sexual activity: Not on file   Other Topics Concern    Not on file   Social History Narrative    Taking a break from school    Wanting to transfer for the fall    Working at [employer]        Exercise: no     Diet: nothing special, tries to avoid GERD inducing foods       Allergies:     Allergies   Allergen Reactions    Other      Environmental allergies       Review of Systems:   Pt denies fevers, chills, myalgias, nausea, diarrhea, dyspnea.    Physical Exam:     Vitals:    05/03/18 1306   BP: 118/69   Pulse: 77   Resp: 18   Temp: 97.1 F (36.2 C)   SpO2: 98%       General Appearance: alert and appropriate, non-toxic  Neuro: alert, oriented, normal speech, normal attention and cognition  HEENT: no scleral icterus, pupils round and reactive, OP clear  Neck: supple  Lungs: clear to auscultation, no wheezes, rales or rhonchi, left CT   Cardiac: normal rate, regular rhythm, normal S1, S2,   Abdomen: soft, non-tender, non-distended, normal active bowel sounds  Extremities: no pedal edema  Skin: no rash  Psych: normal mood and  affect    Labs:     Lab Results   Component Value Date    WBC 11.77 (H) 04/28/2018    HGB 12.3 (L) 04/28/2018    HCT 38.2 04/28/2018    MCV 81.3 04/28/2018    PLT 659 (H) 04/28/2018     Lab Results   Component Value Date    CREAT 0.9 04/30/2018     Lab Results   Component Value Date    ALT 16 04/30/2018    AST 17 04/30/2018    ALKPHOS 111 (H) 04/30/2018    BILITOTAL 0.3  04/30/2018     No results found for: LACTATE    Microbiology:     Microbiology Results     Procedure Component Value Units Date/Time    AFB Culture & Smear [[id number]] Collected:  04/19/18 1053    Specimen:  Sputum from Pleural Fluid Updated:  04/27/18 2006    Narrative:       ORDER#: [id number]                                    ORDERED BY: Alinda Money  SOURCE: Pleural Fluid left                           COLLECTED:  04/19/18 10:53  ANTIBIOTICS AT COLL.:                                RECEIVED :  04/19/18 15:56  Stain, Acid Fast                           FINAL       04/19/18 19:10  04/19/18   No Acid Fast Bacillus Seen  Culture Acid Fast Bacillus (AFB)           PRELIM      04/27/18 20:04  04/27/18   No growth after 1 week/s of incubation.      AFB culture and smear [[id number]] Collected:  04/22/18 2122    Specimen:  Sputum from Tissue, Deep Updated:  04/23/18 1132    Narrative:       ORDER#: [id number]                                    ORDERED BY: Dorothey Baseman, SANDE  SOURCE: Tissue, Deep LEFT ABCESS CAVITY              COLLECTED:  04/22/18 21:22  ANTIBIOTICS AT COLL.:                                RECEIVED :  04/23/18 02:26  Stain, Acid Fast                           FINAL       04/23/18 11:32  04/23/18   No Acid Fast Bacillus Seen  Culture Acid Fast Bacillus (AFB)           PENDING      AFB culture and smear [[id number]] Collected:  04/22/18 2040    Specimen:  Sputum from Tissue, Deep Updated:  04/23/18 1132  Narrative:       ORDER#: [id number]                                    ORDERED BY: Dorothey Baseman, SANDE  SOURCE: Tissue, Deep LEFT PLEURAL WITH DEBRIS        COLLECTED:  04/22/18 20:40  ANTIBIOTICS AT COLL.:                                RECEIVED :  04/23/18 02:30  Stain, Acid Fast                           FINAL       04/23/18 11:32  04/23/18   No Acid Fast Bacillus Seen  Culture Acid Fast Bacillus (AFB)           PENDING      Anaerobic culture [[id number]] Collected:  04/22/18 2040    Specimen:  Other from  Tissue Updated:  04/27/18 0729    Narrative:       ORDER#: [id number]                                    ORDERED BY: Dorothey Baseman, SANDE  SOURCE: Tissue LEFT PLEURAL WITH DEBRIS              COLLECTED:  04/22/18 20:40  ANTIBIOTICS AT COLL.:                                RECEIVED :  04/23/18 02:30  Culture, Anaerobic Bacteria                FINAL       04/27/18 07:29  04/27/18   No anaerobic growth      Anaerobic culture [[id number]] Collected:  04/22/18 2122    Specimen:  Other from Abscess Updated:  04/25/18 1144    Narrative:       ORDER#: [id number]                                    ORDERED BY: Dorothey Baseman, SANDE  SOURCE: Abscess LEFT ABCESS CAVITY                   COLLECTED:  04/22/18 21:22  ANTIBIOTICS AT COLL.:                                RECEIVED :  04/23/18 02:26  Culture, Anaerobic Bacteria                PRELIM      04/25/18 11:44  04/25/18   No growth to date, final report to follow      CULTURE + Dierdre Forth [[id number]] Collected:  04/17/18 1532    Specimen:  Sputum, Induced Updated:  04/19/18 1829    Narrative:       ORDER#: [id number]  ORDERED BY: BLOOM, ROBERT  SOURCE: Sputum, Induced induce sputum                COLLECTED:  04/17/18 15:32  ANTIBIOTICS AT COLL.:                                RECEIVED :  04/17/18 18:14  Stain, Gram (Respiratory)                  FINAL       04/17/18 19:57  04/17/18   Moderate WBC's             Few Squamous epithelial cells             Moderate Mixed Respiratory Flora  Culture and Gram Stain, Aerobic, RespiratorFINAL       04/19/18 18:29  04/19/18   Moderate growth of mixed upper respiratory flora      Culture + Gram Stain,Aerobic, Body Fluid [[id number]] Collected:  04/19/18 1053    Specimen:  Body Fluid from Pleural Fluid Updated:  04/23/18 1525    Narrative:       ORDER#: [id number]                                    ORDERED BY: Alinda Money  SOURCE: Pleural Fluid left                           COLLECTED:  04/19/18  10:53  ANTIBIOTICS AT COLL.:                                RECEIVED :  04/19/18 15:55  Stain, Gram                                FINAL       04/19/18 18:12  04/19/18   Moderate WBCs             No organisms seen  Culture and Gram Stain, Aerobic, Body FluidFINAL       04/23/18 15:25  04/23/18   No growth      Fungus culture [[id number]] Collected:  04/22/18 2122    Specimen:  Other from Tissue Updated:  04/23/18 1130    Narrative:       ORDER#: [id number]                                    ORDERED BY: Dorothey Baseman, SANDE  SOURCE: Tissue LEFT ABCESS CAVITY                    COLLECTED:  04/22/18 21:22  ANTIBIOTICS AT COLL.:                                RECEIVED :  04/23/18 02:26  Stain, Fungal                              FINAL       04/23/18 11:29  04/23/18   No Fungal or Yeast Elements  Seen  Culture Fungus                             PENDING      Fungus culture [[id number]] Collected:  04/22/18 2040    Specimen:  Other from Biopsy Updated:  04/23/18 1126    Narrative:       [id number] called Micro Results of pos fungal stain. Results read back [id number],  by [id number] on 04/23/2018 at 11:26  ORDER#: [id number]                                    ORDERED BY: HQIONGEX, SANDE  SOURCE: Biopsy LEFT PLEURAL WITH DEBRIS              COLLECTED:  04/22/18 20:40  ANTIBIOTICS AT COLL.:                                RECEIVED :  04/23/18 02:30  [id number] called Micro Results of pos fungal stain. Results read back [id number], by [id number] on 04/23/2018 at 11:26  Stain, Fungal                              FINAL       04/23/18 11:26   +  04/23/18   Many Septate Hyphae Seen  Culture Fungus                             PENDING      Legionella antigen, urine [[id number]] Collected:  04/20/18 2004    Specimen:  Urine, Catheterized, Foley Updated:  04/21/18 0403    Narrative:       ORDER#: [id number]                                    ORDERED BY: Geraldine Solar  SOURCE: Urine, Catheterized, Foley                   COLLECTED:  04/20/18 20:04  ANTIBIOTICS AT  COLL.:                                RECEIVED :  04/20/18 23:45  Legionella, Rapid Urinary Antigen          FINAL       04/21/18 04:03  04/21/18   Negative for Legionella pneumophila Serogroup 1 Antigen             Limitations of Test:             1. Negative results do not exclude infection with Legionella                pneumophila Serogroup 1.             2. Does not detect other serogroups of L. pneumophila                or other Legionella species.             Test Reference Range: Negative      Presurgical Surveillance, [place] [[id number]] Collected:  04/20/18 1630    Specimen:  Nares Updated:  04/21/18 1619     Culture Staph and MRSA Surveillance --     No Staph aureus isolated and  No Methicillin Resistant Staph aureus isolated      Narrative:       Please swab nares and throat    Presurgical Surveillance, MSSA+MRSA [[id number]] Collected:  04/20/18 1630    Specimen:  Throat Updated:  04/21/18 1619     Culture Staph and MRSA Surveillance --     No Staph aureus isolated and  No Methicillin Resistant Staph aureus isolated      S. PNEUMONIAE, RAPID URINARY ANTIGEN [[id number]] Collected:  04/20/18 2004    Specimen:  Urine, Clean Catch Updated:  04/21/18 0402    Narrative:       ORDER#: [id number]                                    ORDERED BY: Geraldine Solar  SOURCE: Urine, Clean Catch                           COLLECTED:  04/20/18 20:04  ANTIBIOTICS AT COLL.:                                RECEIVED :  04/20/18 23:45  S. pneumoniae, Rapid Urinary Antigen       FINAL       04/21/18 04:02  04/21/18   Negative for Streptococcus pneumoniae Urinary Antigen             Note:             This is a presumptive test for the direct qualitative             detection of bacterial antigen. This test is not intended as             a substitute for a gram stain and bacterial culture. Samples             with extremely low levels of antigen may yield negative             results.             Reference Range: Negative       Wound culture & gram stain [[id number]] Collected:  04/22/18 2122    Specimen:  Wound from Abscess Updated:  04/27/18 1226    Narrative:       ORDER#: [id number]                                    ORDERED BY: Dorothey Baseman, SANDE  SOURCE: Abscess LEFT ABCESS CAVITY                   COLLECTED:  04/22/18 21:22  ANTIBIOTICS AT COLL.:                                RECEIVED :  04/23/18 02:26  Stain, Gram                                FINAL       04/23/18 03:42  04/23/18  No Squamous epithelial cells seen             Moderate WBCs             No organisms seen  Culture and Gram Stain, Aerobic, Wound     PRELIM      04/27/18 12:26   +  04/24/18   Culture no growth to date, Final report to follow  04/25/18   Light growth of Mycelial Fungus               Refer to Identification on culture #[id number]        Wound culture & gram stain [[id number]] Collected:  04/22/18 2040    Specimen:  Wound Updated:  04/27/18 1226    Narrative:       ORDER#: [id number]                                    ORDERED BY: Dorothey Baseman, SANDE  SOURCE: Wound LEFT PLEURAL WITH DEBRIS               COLLECTED:  04/22/18 20:40  ANTIBIOTICS AT COLL.:                                RECEIVED :  04/23/18 02:30  Stain, Gram                                FINAL       04/23/18 05:06  04/23/18   No Squamous epithelial cells seen             Many WBCs             No organisms seen  Culture and Gram Stain, Aerobic, Wound     PRELIM      04/27/18 12:26   +  04/24/18   Culture no growth to date, Final report to follow  04/25/18   Moderate growth of Mycelial Fungus               Identification to follow              Rads:   X-ray Chest Ap Portable    Result Date: 05/03/2018   No significant change in left pneumothorax with left chest tube in place. Janina Mayo, MD 05/03/2018 11:18 AM      Signed by: Aubery Lapping, NP

## 2018-05-03 NOTE — Plan of Care (Signed)
Problem: Moderate/High Fall Risk Score >5  Goal: Patient will remain free of falls  Outcome: Progressing  Flowsheets  Taken 04/29/2018 2100 by Lynne Leader, RN  Moderate Risk (6-13): LOW-Fall Interventions Appropriate for Low Fall Risk  High (Greater than 13): LOW-Fall Interventions Appropriate for Low Fall Risk  Taken 04/27/2018 0932 by Milagros Evener, RN  VH Moderate Risk (6-13): Use of floor mat;Use assistive devices  Taken 04/28/2018 2143 by Fabian Sharp, RN  VH High Risk (Greater than 13): BED ALARM WILL BE ACTIVATED WHEN THE PATEINT IS IN BED WITH SIGNAGE "RESET BED ALARM";Use assistive devices;Use of floor mat;Keep door open for better visibility     Problem: Safety  Goal: Patient will be free from injury during hospitalization  Outcome: Progressing  Flowsheets (Taken 05/01/2018 2322 by London Pepper, RN)  Patient will be free from injury during hospitalization : Assess patient's risk for falls and implement fall prevention plan of care per policy;Ensure appropriate safety devices are available at the bedside;Provide and maintain safe environment;Include patient/ family/ care giver in decisions related to safety;Use appropriate transfer methods;Hourly rounding  Goal: Patient will be free from infection during hospitalization  Outcome: Progressing     Problem: Discharge Barriers  Goal: Patient will be discharged home or other facility with appropriate resources  Outcome: Progressing  Flowsheets (Taken 05/01/2018 2322 by London Pepper, RN)  Discharge to home or other facility with appropriate resources: Provide appropriate patient education;Provide information on available health resources;Initiate discharge planning     Problem: Inadequate Gas Exchange  Goal: Adequate oxygenation and improved ventilation  Outcome: Progressing  Flowsheets (Taken 05/01/2018 2322 by London Pepper, RN)  Adequate oxygenation and improved ventilation: Assess lung sounds;Provide mechanical and oxygen support to  facilitate gas exchange;Teach/reinforce use of incentive spirometer 10 times per hour while awake, cough and deep breath as needed;Plan activities to conserve energy: plan rest periods  Goal: Patent Airway maintained  Outcome: Progressing     Problem: Inadequate Airway Clearance  Goal: Normal respiratory rate/effort achieved/maintained  Outcome: Progressing  Flowsheets (Taken 05/01/2018 2322 by London Pepper, RN)  Normal respiratory rate/effort achieved/maintained: Plan activities to conserve energy: plan rest periods     Problem: Neurological Deficit  Goal: Neurological status is stable or improving  Outcome: Progressing  Flowsheets (Taken 05/01/2018 2322 by London Pepper, RN)  Neurological status is stable or improving: Perform CAM Assessment     Problem: Peripheral Neurovascular Impairment  Goal: Extremity color, movement, sensation are maintained or improved  Outcome: Progressing  Flowsheets (Taken 05/01/2018 2322 by London Pepper, RN)  Extremity color, movement, sensation are maintained or improved : Increase mobility as tolerated/progressive mobility;Teach/review/reinforce ankle pump exercises     Patient AOx4. Denies n/v, SOB, CP. Vitals stable.  C/o left flank pain, Lidocaine patch applied with relief  Left chest tube dressing CDI. Chest tube to continuous wall suction.  Tolerating regular diet well. Powerade encouraged.  Voiding without difficulty  Patient ambulated >40 laps today  Encouraged the use of IS  Daily CXRs  Hourly rounding on going. Will continue to monitor.

## 2018-05-03 NOTE — Plan of Care (Signed)
Problem: Safety  Goal: Patient will be free from injury during hospitalization  Outcome: Progressing  Patient will be free from injury during hospitalization : Assess patient's risk for falls and implement fall prevention plan of care per policy;Ensure appropriate safety devices are available at the bedside;Provide and maintain safe environment;Include patient/ family/ care giver in decisions related to safety;Use appropriate transfer methods;Hourly rounding  Goal: Patient will be free from infection during hospitalization  Outcome: Progressing  Free from Infection during hospitalization: Assess and monitor for signs and symptoms of infection;Monitor lab/diagnostic results;Encourage patient and family to use good hand hygiene technique     Problem: Discharge Barriers  Goal: Patient will be discharged home or other facility with appropriate resources  Outcome: Progressing  Discharge to home or other facility with appropriate resources: Provide appropriate patient education;Provide information on available health resources;Initiate discharge planning     Problem: Inadequate Gas Exchange  Goal: Adequate oxygenation and improved ventilation  Outcome: Progressing  Adequate oxygenation and improved ventilation: Assess lung sounds;Provide mechanical and oxygen support to facilitate gas exchange;Teach/reinforce use of incentive spirometer 10 times per hour while awake, cough and deep breath as needed;Plan activities to conserve energy: plan rest periods  Goal: Patent Airway maintained  Outcome: Progressing  Patent airway maintained : Position patient for maximum ventilatory efficiency;Reinforce use of ordered respiratory interventions (i.e. CPAP, BiPAP, Incentive Spirometer, Acapella, etc.);Reposition patient every 2 hours and as needed unless able to self-reposition     Problem: Inadequate Airway Clearance  Goal: Normal respiratory rate/effort achieved/maintained  Outcome: Progressing  Normal respiratory rate/effort  achieved/maintained: Plan activities to conserve energy: plan rest periods     Problem: Neurological Deficit  Goal: Neurological status is stable or improving  Outcome: Progressing  Neurological status is stable or improving: Perform CAM Assessment     Problem: Peripheral Neurovascular Impairment  Goal: Extremity color, movement, sensation are maintained or improved  Outcome: Progressing  Extremity color, movement, sensation are maintained or improved : Increase mobility as tolerated/progressive mobility;Teach/review/reinforce ankle pump exercises     POD #10 S/P THORACOSCOPY, (VATS), DECORTICATION   Chest tube to wall suction. -15cmH2O. Dressing, CDI.     A/O x4. VSS. Afebrile. On RA.  Ambulates and uses IS.   Tolerated with meals, denied nausea.    Diminished lung sounds at bases, denied SOB or respiratory distress.  Getting antifungal po med and IV abx.    Mild pain today, given Tylenol and lidocaine patch.   May water seal and midline on Monday per thoracic team.   Continue to monitor

## 2018-05-03 NOTE — Progress Notes (Signed)
Thoracic Surgery Daily Progress Note  [facility] - Thoracic Surgery   Team Spectra: [phone] / [phone]    Hospital Day: Hospital Day: 18  Post Operative Day: 11 Days Post-Op   Primary Procedure: Procedure(s) (LRB):  THORACOSCOPY, (VATS), DECORTICATION (Left)     Assessment:   Craig Phelps is a 23 y.o. male with left lung abscess and empyema s/p pigtail 2/27 with a persistent left hydropneumothorax now 11 Days Post-Op s/p left VATS, decortication. Peural cultures w/ Mycelial fungus. Continues to have large multi-chamber air leak, CXR unchanged from previous.    Plan:   -- continue left chest tube to continuous suction - , even when ambulating. May be able to transition to water seal Monday.  -- continue ertapenem & voriconazole per ID. PICC line prior to discharge  -- daily morning chest x-ray  -- tylenol & PRN tramadol for pain control  -- Encourage aggressive pulm toilet with ambulation, IS, PEP/PAP therapy, duo nebs     Interval History:   NAEON -pt tolerating diet, normal GI function, pain well controlled. AFVSS    Physical Exam:     Vitals:    05/02/18 2000 05/02/18 2121 05/02/18 2300 05/03/18 0614   BP: 116/82  93/86 118/78   Pulse: 86 86 98 90   Resp: 16 18 18 19    Temp: 98.1 F (36.7 C)  98.3 F (36.8 C) 97.1 F (36.2 C)   TempSrc: Oral   Oral   SpO2: 97% 97% 98% 98%   Weight:       Height:            PHYSICAL EXAM:  General: AAOx3, NAD  HEENT: Atraumatic, normocephalic, PERRLA  CV: RRR  Pulm: CTAB on R, expiratory wheezing on L, good respiratory effort, CT suction on L, air leak improved.    Lines/Drains:   Left chest tube: 120cc SS output, CT suction, + air leak but improved from yesterday.    Laboratory and Radiology Results:     Recent Labs   Lab 04/30/18  1213 04/28/18  0815   WBC  --  11.77*   RBC  --  4.70   Hgb  --  12.3*   Hematocrit  --  38.2   Sodium 140 139   Potassium 4.7 4.5   Chloride 103 104   CO2 22 23   BUN 14.0 13.0   Creatinine 0.9 0.8   Calcium 10.3 9.2   Magnesium 2.3 1.9   Glucose  67* 81                 No results found.        Jadene Pierini, MD  PGY2 General Surgery

## 2018-05-04 ENCOUNTER — Inpatient Hospital Stay: Payer: BLUE CROSS/BLUE SHIELD

## 2018-05-04 MED ORDER — ALBUTEROL-IPRATROPIUM 2.5-0.5 (3) MG/3ML IN SOLN
3.00 mL | Freq: Four times a day (QID) | RESPIRATORY_TRACT | Status: DC
Start: 2018-05-04 — End: 2018-05-05
  Administered 2018-05-04 – 2018-05-05 (×3): 3 mL via RESPIRATORY_TRACT
  Filled 2018-05-04 (×4): qty 3

## 2018-05-04 NOTE — Plan of Care (Addendum)
AOx4.  Respirations even and unlabored. No sob.   CT switched from suction to water seal around 1300.   CXR 4 hours later showed larger pneumo per radiologist. Resident Marquita Palms called and notified. No new orders. Maintain on water seal and notify MD if acute sob noted. Oncoming RN aware.   Ambulated over an hour off unit this shift.   Dressing to CT site changed this shift. Statlock in place to prevent pulling of tube. New dressing c.d.i.  Pain managed effectively. No complains of discomfort at this time.   Mother at bedside.  Bed locked and in lowest position, fall mat in place. Call bell within reach at all times.     Problem: Moderate/High Fall Risk Score >5  Goal: Patient will remain free of falls  Outcome: Progressing  Flowsheets (Taken 04/29/2018 2100 by Lynne Leader, RN)  Moderate Risk (6-13): LOW-Fall Interventions Appropriate for Low Fall Risk     Problem: Safety  Goal: Patient will be free from injury during hospitalization  Outcome: Progressing  Flowsheets (Taken 05/04/2018 1420)  Patient will be free from injury during hospitalization : Assess patient's risk for falls and implement fall prevention plan of care per policy;Provide and maintain safe environment;Use appropriate transfer methods;Ensure appropriate safety devices are available at the bedside;Hourly rounding  Goal: Patient will be free from infection during hospitalization  Outcome: Progressing  Flowsheets (Taken 05/04/2018 1420)  Free from Infection during hospitalization: Assess and monitor for signs and symptoms of infection;Monitor lab/diagnostic results;Encourage patient and family to use good hand hygiene technique     Problem: Discharge Barriers  Goal: Patient will be discharged home or other facility with appropriate resources  Outcome: Progressing  Flowsheets (Taken 05/04/2018 1420)  Discharge to home or other facility with appropriate resources: Provide appropriate patient education;Provide information on available health  resources;Initiate discharge planning     Problem: Inadequate Gas Exchange  Goal: Adequate oxygenation and improved ventilation  Outcome: Progressing  Flowsheets (Taken 05/04/2018 1420)  Adequate oxygenation and improved ventilation: Assess lung sounds;Position for maximum ventilatory efficiency;Increase activity as tolerated/progressive mobility  Goal: Patent Airway maintained  Outcome: Progressing  Flowsheets (Taken 05/04/2018 1420)  Patent airway maintained : Position patient for maximum ventilatory efficiency     Problem: Inadequate Airway Clearance  Goal: Normal respiratory rate/effort achieved/maintained  Outcome: Progressing  Flowsheets (Taken 05/04/2018 1420)  Normal respiratory rate/effort achieved/maintained: Plan activities to conserve energy: plan rest periods     Problem: Neurological Deficit  Goal: Neurological status is stable or improving  Outcome: Progressing  Flowsheets (Taken 05/04/2018 1420)  Neurological status is stable or improving: Observe for seizure activity and initiate seizure precautions if indicated;Perform CAM Assessment     Problem: Peripheral Neurovascular Impairment  Goal: Extremity color, movement, sensation are maintained or improved  Outcome: Progressing  Flowsheets (Taken 05/04/2018 1420)  Extremity color, movement, sensation are maintained or improved : Increase mobility as tolerated/progressive mobility;Assess extremity for proper alignment;Teach/review/reinforce ankle pump exercises

## 2018-05-04 NOTE — Progress Notes (Signed)
Thoracic Surgery Daily Progress Note  [facility] - Thoracic Surgery   Team Spectra: [phone] / [phone]    Hospital Day: Hospital Day: 4  Post Operative Day: 12 Days Post-Op   Primary Procedure: Procedure(s) (LRB):  THORACOSCOPY, (VATS), DECORTICATION (Left)     Assessment:   Craig Phelps is a 23 y.o. male with left lung abscess and empyema s/p pigtail 2/27 with a persistent left hydropneumothorax now 12 Days Post-Op s/p left VATS, decortication. Peural cultures w/ Mycelial fungus. Continues with persistent air leak.    Plan:   -- continue left chest tube to suction   -- will attempt water seal trial today with follow up CXR to evaluate lung collapse on water seal  -- continue ertapenem & voriconazole per ID. PICC line prior to discharge  -- daily morning chest x-ray  -- tylenol & PRN tramadol for pain control  -- continue aggressive pulm toiler & ambulation    Interval History:   NAEON. Doing well this morning.    Physical Exam:     Vitals:    05/03/18 1306 05/03/18 1736 05/03/18 1920 05/03/18 2306   BP: 118/69 141/75 124/82 130/83   Pulse: 77 79 100 99   Resp: 18 18 16 16    Temp: 97.1 F (36.2 C) 97.4 F (36.3 C) 97.5 F (36.4 C) 98.1 F (36.7 C)   TempSrc: Oral Oral Oral Oral   SpO2: 98% 97% 97% 99%   Weight:       Height:            PHYSICAL EXAM:  General: AAOx3, NAD  HEENT: Atraumatic, normocephalic, PERRLA  CV: RRR  Pulm: CTAB, chest tube w/ +AL    Lines/Drains:   Left chest tube: 100cc SS output, +AL    Laboratory and Radiology Results:     Recent Labs   Lab 04/30/18  1213 04/28/18  0815   WBC  --  11.77*   RBC  --  4.70   Hgb  --  12.3*   Hematocrit  --  38.2   Sodium 140 139   Potassium 4.7 4.5   Chloride 103 104   CO2 22 23   BUN 14.0 13.0   Creatinine 0.9 0.8   Calcium 10.3 9.2   Magnesium 2.3 1.9   Glucose 67* 81                 X-ray Chest Ap Portable    Result Date: 05/03/2018   No significant change in left pneumothorax with left chest tube in place. Janina Mayo, MD 05/03/2018 11:18 AM       [name], MD  Thoracic Surgery, R4      ** PLEASE DO NOT PAGE - CALL SPECTRA FIRST IF QUESTIONS **    -------------------------------------------------------------------------------------------------------------------

## 2018-05-04 NOTE — Progress Notes (Signed)
ID PROGRESS NOTE      Spectra: [phone]    Office: [phone]    Date Time: 05/04/18 @NOW   Patient Name: Craig Phelps, Craig Phelps    Problem List:      Acute:  Subacute pneumonitis, likely aspiration related as the patient mentions of GERD and frequent alcohol intoxication with vomiting episodes.  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 Pleural material now noted to contain hyphae.Hx of frequent vomiting, including due to excesses of alcohol. No evidence of immunosuppression. The most likely chain of events is a cavity which formed due to aspiration becoming superinfected with fungal organism and leading to rupture into the pleural space.    CXR 3/16 shows stable L hydropneumothorax.    Multiple cultures with fungus. ID pending. We will plan on a 4 week course of an antifungal and of Ertapenem to cover the possible bacterial component of the infection. He will stay in house for pulmonary toileting through the weekend.     Afebrile; improved WBC 15K --> 11K. tolerating abx well.     Antimicrobials:   abx #19  #15 Ertapenem 1 gram IV daily  #11 Voriconazole 200mg  PO BID    Plan:     Continue Ertapenem for possible bacterial component of infection  Continue Voriconazole (until 4/02)  ID of the fungus pending  Labs tomorrow morning  Will need a midline prior to discharge.  F2F in place.    [facility]  [phone]  This patient is being evaluated for the need for intravenous antibiotics after discharge    Diagnosis  requiring antibiotics: Empyema    ANTIBIOTIC RECOMMENDATION/DISCHARGE PLANNING  Recommendation for Definitive outpatient Antibiotic:  Ertapenem 1 gm IV daily until 05/17/18   Weekly CBC, CMP   Discharge Destination:   Home with family    Discharge Planning:       ID requirements for Discharge: Line, home IVAB to be set up    Follow up ID appointment placed: Pt will call    Discussed with Case Management: no   Home Health Face-to-Face Placed: 04/29/18    IV ACCESS RECOMMENDATIONS: IV Access Status: will order   Preferred Post Discharge IV Access: midline      For any questions regarding home outpatient antibiotics, please call [phone]      Lines:     Patient Lines/Drains/Airways Status    Active Lines, Drains and Airways     Name:   Placement date:   Placement time:   Site:   Days:    Peripheral IV 04/25/18 Left Antecubital   04/25/18    2146    Antecubital   8    Chest Tube 1 Other (Comment) 28 Fr.   04/22/18    2210    --   11                Family History:     Family History   Problem Relation Age of Onset    ADD / ADHD Father  ADD / ADHD Brother     Cancer Paternal Grandfather 38        bladder, smoker    Pulmonary fibrosis Maternal Grandmother         also with ITP    Alzheimer's disease Paternal Grandmother 14       Social History:     Social History     Socioeconomic History    Marital status: Single     Spouse name: Not on file    Number of children: 0    Years of education: Not on file    Highest education level: Not on file   Occupational History    Occupation: Archivist   Social Needs    Financial resource strain: Not on file    Food insecurity:     Worry: Not on file     Inability: Not on file    Transportation needs:     Medical: Not on file     Non-medical: Not on file   Tobacco Use    Smoking status: Former Smoker    Smokeless tobacco: Never Used    Tobacco comment: quit 1 week ago   Substance and Sexual Activity    Alcohol use: Not Currently     Alcohol/week: 0.0 standard  drinks    Drug use: No    Sexual activity: Yes     Partners: Female     Comment: monogamous   Lifestyle    Physical activity:     Days per week: Not on file     Minutes per session: Not on file    Stress: Not on file   Relationships    Social connections:     Talks on phone: Not on file     Gets together: Not on file     Attends religious service: Not on file     Active member of club or organization: Not on file     Attends meetings of clubs or organizations: Not on file     Relationship status: Not on file    Intimate partner violence:     Fear of current or ex partner: Not on file     Emotionally abused: Not on file     Physically abused: Not on file     Forced sexual activity: Not on file   Other Topics Concern    Not on file   Social History Narrative    Taking a break from school    Wanting to transfer for the fall    Working at [employer]        Exercise: no     Diet: nothing special, tries to avoid GERD inducing foods       Allergies:     Allergies   Allergen Reactions    Other      Environmental allergies       Review of Systems:   Denies fevers, chills, ns, n/v/d, abdominal pain, sob, cp.     Physical Exam:     Vitals:    05/03/18 2306   BP: 130/83   Pulse: 99   Resp: 16   Temp: 98.1 F (36.7 C)   SpO2: 99%       General Appearance: alert and appropriate, non-toxic  Neuro: alert, oriented, normal speech, normal attention and cognition  HEENT: no scleral icterus, pupils round and reactive, OP clear  Neck: supple  Lungs: dim bases on the L-side, inspiratory wheeze on L-side; left CT to suction  Cardiac:  normal rate, regular rhythm, normal S1, S2,   Abdomen: soft, non-tender, non-distended  Extremities: no pedal edema  Skin: no rash  Psych: normal mood and affect    Labs:     Lab Results   Component Value Date    WBC 11.77 (H) 04/28/2018    HGB 12.3 (L) 04/28/2018    HCT 38.2 04/28/2018    MCV 81.3 04/28/2018    PLT 659 (H) 04/28/2018     Lab Results   Component Value Date    CREAT 0.9 04/30/2018      Lab Results   Component Value Date    ALT 16 04/30/2018    AST 17 04/30/2018    ALKPHOS 111 (H) 04/30/2018    BILITOTAL 0.3 04/30/2018     No results found for: LACTATE    Microbiology:     Microbiology Results     Procedure Component Value Units Date/Time    AFB Culture & Smear [[id number]] Collected:  04/19/18 1053    Specimen:  Sputum from Pleural Fluid Updated:  04/27/18 2006    Narrative:       ORDER#: [id number]                                    ORDERED BY: Alinda Money  SOURCE: Pleural Fluid left                           COLLECTED:  04/19/18 10:53  ANTIBIOTICS AT COLL.:                                RECEIVED :  04/19/18 15:56  Stain, Acid Fast                           FINAL       04/19/18 19:10  04/19/18   No Acid Fast Bacillus Seen  Culture Acid Fast Bacillus (AFB)           PRELIM      04/27/18 20:04  04/27/18   No growth after 1 week/s of incubation.      AFB culture and smear [[id number]] Collected:  04/22/18 2122    Specimen:  Sputum from Tissue, Deep Updated:  04/23/18 1132    Narrative:       ORDER#: [id number]                                    ORDERED BY: Dorothey Baseman, SANDE  SOURCE: Tissue, Deep LEFT ABCESS CAVITY              COLLECTED:  04/22/18 21:22  ANTIBIOTICS AT COLL.:                                RECEIVED :  04/23/18 02:26  Stain, Acid Fast                           FINAL       04/23/18 11:32  04/23/18   No Acid Fast Bacillus Seen  Culture Acid Fast Bacillus (AFB)           PENDING  AFB culture and smear [[id number]] Collected:  04/22/18 2040    Specimen:  Sputum from Tissue, Deep Updated:  04/23/18 1132    Narrative:       ORDER#: [id number]                                    ORDERED BY: Dorothey Baseman, SANDE  SOURCE: Tissue, Deep LEFT PLEURAL WITH DEBRIS        COLLECTED:  04/22/18 20:40  ANTIBIOTICS AT COLL.:                                RECEIVED :  04/23/18 02:30  Stain, Acid Fast                           FINAL       04/23/18 11:32  04/23/18   No Acid Fast Bacillus Seen  Culture Acid  Fast Bacillus (AFB)           PENDING      Anaerobic culture [[id number]] Collected:  04/22/18 2040    Specimen:  Other from Tissue Updated:  04/27/18 0729    Narrative:       ORDER#: [id number]                                    ORDERED BY: Dorothey Baseman, SANDE  SOURCE: Tissue LEFT PLEURAL WITH DEBRIS              COLLECTED:  04/22/18 20:40  ANTIBIOTICS AT COLL.:                                RECEIVED :  04/23/18 02:30  Culture, Anaerobic Bacteria                FINAL       04/27/18 07:29  04/27/18   No anaerobic growth      Anaerobic culture [[id number]] Collected:  04/22/18 2122    Specimen:  Other from Abscess Updated:  04/25/18 1144    Narrative:       ORDER#: [id number]                                    ORDERED BY: Dorothey Baseman, SANDE  SOURCE: Abscess LEFT ABCESS CAVITY                   COLLECTED:  04/22/18 21:22  ANTIBIOTICS AT COLL.:                                RECEIVED :  04/23/18 02:26  Culture, Anaerobic Bacteria                PRELIM      04/25/18 11:44  04/25/18   No growth to date, final report to follow      CULTURE + Dierdre Forth [[id number]] Collected:  04/17/18 1532    Specimen:  Sputum, Induced Updated:  04/19/18 1829    Narrative:       ORDER#: [id number]  ORDERED BY: BLOOM, ROBERT  SOURCE: Sputum, Induced induce sputum                COLLECTED:  04/17/18 15:32  ANTIBIOTICS AT COLL.:                                RECEIVED :  04/17/18 18:14  Stain, Gram (Respiratory)                  FINAL       04/17/18 19:57  04/17/18   Moderate WBC's             Few Squamous epithelial cells             Moderate Mixed Respiratory Flora  Culture and Gram Stain, Aerobic, RespiratorFINAL       04/19/18 18:29  04/19/18   Moderate growth of mixed upper respiratory flora      Culture + Gram Stain,Aerobic, Body Fluid [[id number]] Collected:  04/19/18 1053    Specimen:  Body Fluid from Pleural Fluid Updated:  04/23/18 1525    Narrative:       ORDER#: [id number]                                     ORDERED BY: Alinda Money  SOURCE: Pleural Fluid left                           COLLECTED:  04/19/18 10:53  ANTIBIOTICS AT COLL.:                                RECEIVED :  04/19/18 15:55  Stain, Gram                                FINAL       04/19/18 18:12  04/19/18   Moderate WBCs             No organisms seen  Culture and Gram Stain, Aerobic, Body FluidFINAL       04/23/18 15:25  04/23/18   No growth      Fungus culture [[id number]] Collected:  04/22/18 2122    Specimen:  Other from Tissue Updated:  04/23/18 1130    Narrative:       ORDER#: [id number]                                    ORDERED BY: Dorothey Baseman, SANDE  SOURCE: Tissue LEFT ABCESS CAVITY                    COLLECTED:  04/22/18 21:22  ANTIBIOTICS AT COLL.:                                RECEIVED :  04/23/18 02:26  Stain, Fungal                              FINAL       04/23/18 11:29  04/23/18   No Fungal or Yeast Elements  Seen  Culture Fungus                             PENDING      Fungus culture [[id number]] Collected:  04/22/18 2040    Specimen:  Other from Biopsy Updated:  04/23/18 1126    Narrative:       [id number] called Micro Results of pos fungal stain. Results read back [id number],  by [id number] on 04/23/2018 at 11:26  ORDER#: [id number]                                    ORDERED BY: HQIONGEX, SANDE  SOURCE: Biopsy LEFT PLEURAL WITH DEBRIS              COLLECTED:  04/22/18 20:40  ANTIBIOTICS AT COLL.:                                RECEIVED :  04/23/18 02:30  [id number] called Micro Results of pos fungal stain. Results read back [id number], by [id number] on 04/23/2018 at 11:26  Stain, Fungal                              FINAL       04/23/18 11:26   +  04/23/18   Many Septate Hyphae Seen  Culture Fungus                             PENDING      Legionella antigen, urine [[id number]] Collected:  04/20/18 2004    Specimen:  Urine, Catheterized, Foley Updated:  04/21/18 0403    Narrative:       ORDER#: [id number]                                    ORDERED  BY: Geraldine Solar  SOURCE: Urine, Catheterized, Foley                   COLLECTED:  04/20/18 20:04  ANTIBIOTICS AT COLL.:                                RECEIVED :  04/20/18 23:45  Legionella, Rapid Urinary Antigen          FINAL       04/21/18 04:03  04/21/18   Negative for Legionella pneumophila Serogroup 1 Antigen             Limitations of Test:             1. Negative results do not exclude infection with Legionella                pneumophila Serogroup 1.             2. Does not detect other serogroups of L. pneumophila                or other Legionella species.             Test Reference Range: Negative      Presurgical Surveillance, [place] [[id number]] Collected:  04/20/18 1630    Specimen:  Nares Updated:  04/21/18 1619     Culture Staph and MRSA Surveillance --     No Staph aureus isolated and  No Methicillin Resistant Staph aureus isolated      Narrative:       Please swab nares and throat    Presurgical Surveillance, MSSA+MRSA [[id number]] Collected:  04/20/18 1630    Specimen:  Throat Updated:  04/21/18 1619     Culture Staph and MRSA Surveillance --     No Staph aureus isolated and  No Methicillin Resistant Staph aureus isolated      S. PNEUMONIAE, RAPID URINARY ANTIGEN [[id number]] Collected:  04/20/18 2004    Specimen:  Urine, Clean Catch Updated:  04/21/18 0402    Narrative:       ORDER#: [id number]                                    ORDERED BY: Geraldine Solar  SOURCE: Urine, Clean Catch                           COLLECTED:  04/20/18 20:04  ANTIBIOTICS AT COLL.:                                RECEIVED :  04/20/18 23:45  S. pneumoniae, Rapid Urinary Antigen       FINAL       04/21/18 04:02  04/21/18   Negative for Streptococcus pneumoniae Urinary Antigen             Note:             This is a presumptive test for the direct qualitative             detection of bacterial antigen. This test is not intended as             a substitute for a gram stain and bacterial culture. Samples             with  extremely low levels of antigen may yield negative             results.             Reference Range: Negative      Wound culture & gram stain [[id number]] Collected:  04/22/18 2122    Specimen:  Wound from Abscess Updated:  04/27/18 1226    Narrative:       ORDER#: [id number]                                    ORDERED BY: Dorothey Baseman, SANDE  SOURCE: Abscess LEFT ABCESS CAVITY                   COLLECTED:  04/22/18 21:22  ANTIBIOTICS AT COLL.:                                RECEIVED :  04/23/18 02:26  Stain, Gram                                FINAL       04/23/18 03:42  04/23/18  No Squamous epithelial cells seen             Moderate WBCs             No organisms seen  Culture and Gram Stain, Aerobic, Wound     PRELIM      04/27/18 12:26   +  04/24/18   Culture no growth to date, Final report to follow  04/25/18   Light growth of Mycelial Fungus               Refer to Identification on culture #[id number]        Wound culture & gram stain [[id number]] Collected:  04/22/18 2040    Specimen:  Wound Updated:  04/27/18 1226    Narrative:       ORDER#: [id number]                                    ORDERED BY: Dorothey Baseman, SANDE  SOURCE: Wound LEFT PLEURAL WITH DEBRIS               COLLECTED:  04/22/18 20:40  ANTIBIOTICS AT COLL.:                                RECEIVED :  04/23/18 02:30  Stain, Gram                                FINAL       04/23/18 05:06  04/23/18   No Squamous epithelial cells seen             Many WBCs             No organisms seen  Culture and Gram Stain, Aerobic, Wound     PRELIM      04/27/18 12:26   +  04/24/18   Culture no growth to date, Final report to follow  04/25/18   Moderate growth of Mycelial Fungus               Identification to follow              Rads:   X-ray Chest Ap Portable    Result Date: 05/04/2018  1. Stable left hydropneumothorax. Olen Pel, MD 05/04/2018 6:42 AM      Signed by: Cleone Slim, NP

## 2018-05-04 NOTE — Plan of Care (Addendum)
POD#13S/P Thoracoscopy (VATS)    Major Shift Events:NONE    Orientation: AOx4, follow commands, know his need. In good spirit.  Rhythm on tele:none  Cardiac:110- 120's , HR in80's  Respiratory:RA, Regular easy and unlabored  Ambulation:Independent.  Pain:L side chest. Incision site  Lines/Drips:PIV RUA,- saline locked CT L chest  GI/GU:Continent in both bowel/bladder, voiding freely.  BM this shift?  Skin:incision left chest.  Braden Score:   Fall Score:Low  Does the patient require an Interpreter?No.      Comments:  VSS, no fever ,   On CT water seal. Dressing remain CDI-15cm H20.+ for air leak  IS- 3500  Tolerating regular diet  He denies sob/nv/ha/cp  Pain3/10 -PRN pain med's well controled.  RefusedKepra   IV antibiotics- IV ertapenem  Antifungal - VFEND PO  *0600 - refused to draw lab - reinforced that Dr. need to see it, pt states wanted to do it at 0800 am- pt request acknowledged. Will let MD know during rounds.     PLAN:  Vitals Q4H:  IS . Ambulate  Daily CXR  Midline/PICC prior to [place] 3/17. F2F placed.   Call MD with questions or concerns.    Problem: Moderate/High Fall Risk Score >5  Goal: Patient will remain free of falls  Outcome: Progressing  Flowsheets (Taken 04/29/2018 2100 by Lynne Leader, RN)  Moderate Risk (6-13): LOW-Fall Interventions Appropriate for Low Fall Risk     Problem: Safety  Goal: Patient will be free from injury during hospitalization  Outcome: Progressing  Flowsheets (Taken 05/04/2018 2244)  Patient will be free from injury during hospitalization : Assess patient's risk for falls and implement fall prevention plan of care per policy; Provide and maintain safe environment; Use appropriate transfer methods; Hourly rounding; Include patient/ family/ care giver in decisions related to safety; Ensure appropriate safety devices are available at the bedside  Goal: Patient will be free from infection during hospitalization  Outcome:  Progressing  Flowsheets (Taken 05/04/2018 2244)  Free from Infection during hospitalization: Assess and monitor for signs and symptoms of infection; Monitor lab/diagnostic results     Problem: Discharge Barriers  Goal: Patient will be discharged home or other facility with appropriate resources  Outcome: Progressing  Flowsheets (Taken 05/04/2018 2244)  Discharge to home or other facility with appropriate resources: Provide appropriate patient education; Provide information on available health resources     Problem: Inadequate Gas Exchange  Goal: Adequate oxygenation and improved ventilation  Outcome: Progressing  Flowsheets (Taken 05/04/2018 2244)  Adequate oxygenation and improved ventilation: Assess lung sounds; Monitor SpO2 and treat as needed; Teach/reinforce use of incentive spirometer 10 times per hour while awake, cough and deep breath as needed; Increase activity as tolerated/progressive mobility; Plan activities to conserve energy: plan rest periods  Goal: Patent Airway maintained  Outcome: Progressing  Flowsheets (Taken 05/04/2018 2244)  Patent airway maintained : Position patient for maximum ventilatory efficiency; Provide adequate fluid intake to liquefy secretions; Suction secretions as needed; Reposition patient every 2 hours and as needed unless able to self-reposition     Problem: Inadequate Airway Clearance  Goal: Normal respiratory rate/effort achieved/maintained  Outcome: Progressing  Flowsheets (Taken 05/04/2018 2244)  Normal respiratory rate/effort achieved/maintained: Plan activities to conserve energy: plan rest periods

## 2018-05-04 NOTE — Consults (Signed)
Consult Note     Length of stay: Hospital Day 18    Consult: "outpt IV ABX"  DISPO: Home with [facility] for Home IV ABX  Current estimated discharge date on file: TBD  Barriers to discharge: None identified at this time      S: Patient admitted through Emergency Department due to seizure at home and found to have left pneumothorax after about 3 months of persistent cough and 5 days of likely viral gastroenteritis.      B: Patient is a 23 year old male with PMH of seizure.      A: Patient found to have a left lung abscess and empyema s/p pigtail 2/27 with a persistent left hydropneumothorax. Patient is 11 days post-op. S/P left VATS, decortication.     Plan at this time is to continue left chest tube to continuous suction, even when ambulating; continue IV abx; and encourage aggressive pulm toileting.      R: Spoke with [facility] Betha Loa regarding patient; referral has been sent for home IV abx, awaiting a clear discharge date.      CM continues to follow for care coordination.     Tressie Stalker  Clinical Case Manager  [phone]

## 2018-05-05 ENCOUNTER — Inpatient Hospital Stay: Payer: BLUE CROSS/BLUE SHIELD

## 2018-05-05 LAB — COMPREHENSIVE METABOLIC PANEL
ALT: 14 U/L (ref 0–55)
AST (SGOT): 18 U/L (ref 5–34)
Albumin/Globulin Ratio: 0.9 (ref 0.9–2.2)
Albumin: 3.5 g/dL (ref 3.5–5.0)
Alkaline Phosphatase: 114 U/L — ABNORMAL HIGH (ref 38–106)
BUN: 13 mg/dL (ref 9.0–28.0)
Bilirubin, Total: 0.4 mg/dL (ref 0.2–1.2)
CO2: 24 mEq/L (ref 22–29)
Calcium: 9.8 mg/dL (ref 8.5–10.5)
Chloride: 104 mEq/L (ref 100–111)
Creatinine: 0.8 mg/dL (ref 0.7–1.3)
Globulin: 4.1 g/dL — ABNORMAL HIGH (ref 2.0–3.6)
Glucose: 71 mg/dL (ref 70–100)
Potassium: 4.5 mEq/L (ref 3.5–5.1)
Protein, Total: 7.6 g/dL (ref 6.0–8.3)
Sodium: 139 mEq/L (ref 136–145)

## 2018-05-05 LAB — MAN DIFF ONLY
Band Neutrophils Absolute: 0 10*3/uL (ref 0.00–1.00)
Band Neutrophils: 0 %
Basophils Absolute Manual: 0 10*3/uL (ref 0.00–0.08)
Basophils Manual: 0 %
Eosinophils Absolute Manual: 3.6 10*3/uL — ABNORMAL HIGH (ref 0.00–0.44)
Eosinophils Manual: 22 %
Lymphocytes Absolute Manual: 3.11 10*3/uL (ref 0.42–3.22)
Lymphocytes Manual: 19 %
Monocytes Absolute: 0.98 10*3/uL — ABNORMAL HIGH (ref 0.21–0.85)
Monocytes Manual: 6 %
Myelocytes Absolute: 0.16 10*3/uL — ABNORMAL HIGH
Myelocytes: 1 %
Neutrophils Absolute Manual: 8.51 10*3/uL — ABNORMAL HIGH (ref 1.10–6.33)
Segmented Neutrophils: 52 %

## 2018-05-05 LAB — GFR: EGFR: >60

## 2018-05-05 LAB — CBC AND DIFFERENTIAL
Absolute NRBC: 0 10*3/uL (ref 0.00–0.00)
Hematocrit: 40.9 % (ref 37.6–49.6)
Hgb: 13.5 g/dL (ref 12.5–17.1)
MCH: 26.9 pg (ref 25.1–33.5)
MCHC: 33 g/dL (ref 31.5–35.8)
MCV: 81.6 fL (ref 78.0–96.0)
MPV: 9.4 fL (ref 8.9–12.5)
Nucleated RBC: 0 /100 WBC (ref 0.0–0.0)
Platelets: 782 10*3/uL — ABNORMAL HIGH (ref 142–346)
RBC: 5.01 10*6/uL (ref 4.20–5.90)
RDW: 13 % (ref 11–15)
WBC: 16.36 10*3/uL — ABNORMAL HIGH (ref 3.10–9.50)

## 2018-05-05 LAB — CELL MORPHOLOGY
Cell Morphology: NORMAL
Platelet Estimate: INCREASED — AB

## 2018-05-05 MED ORDER — SODIUM CHLORIDE 0.9 % IV MBP
1000.00 mg | Freq: Every day | INTRAVENOUS | 0 refills | Status: DC
Start: 2018-05-06 — End: 2018-06-11

## 2018-05-05 MED ORDER — TRAMADOL HCL 50 MG PO TABS
50.00 mg | ORAL_TABLET | Freq: Four times a day (QID) | ORAL | 0 refills | Status: AC | PRN
Start: 2018-05-05 — End: 2018-05-12

## 2018-05-05 MED ORDER — ACETAMINOPHEN 325 MG PO TABS
650.00 mg | ORAL_TABLET | Freq: Four times a day (QID) | ORAL | 0 refills | Status: AC
Start: 2018-05-05 — End: ?

## 2018-05-05 MED ORDER — VORICONAZOLE 200 MG PO TABS
200.00 mg | ORAL_TABLET | Freq: Two times a day (BID) | ORAL | 0 refills | Status: AC
Start: 2018-05-05 — End: 2018-05-21

## 2018-05-05 NOTE — Progress Notes (Signed)
ID PROGRESS NOTE      Spectra: [phone]    Office: [phone]    Date Time: 05/05/18 @NOW   Patient Name: Craig Phelps, Craig Phelps    Problem List:      Acute:  Subacute pneumonitis, likely aspiration related as the patient mentions of GERD and frequent alcohol intoxication with vomiting episodes.  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 Pleural material now noted to contain hyphae.Hx of frequent vomiting, including due to excesses of alcohol. No evidence of immunosuppression. The most likely chain of events is a cavity which formed due to aspiration becoming superinfected with fungal organism and leading to rupture into the pleural space.    CXR 3/16 shows stable L hydropneumothorax.    Multiple cultures with fungus. We will plan on a 4 week course of an antifungal and of Ertapenem to cover the possible bacterial component of the infection. Now pt with L chest tube to water seal. D/c home underway; midline ordered.    Afebrile; continues to do well. Increase in WBCs today but pt states he's feeling well. No more wheezing.    Antimicrobials:   abx #20  #16 Ertapenem 1 gram IV daily  #12 Voriconazole 200mg  PO BID    Plan:     Continue Ertapenem for possible bacterial component of infection  Continue Voriconazole (until 4/02)  Trend labs  ID of the fungus pending  Midline ordered  F2F in place.  Will need to follow-up in the office after d/c    Discussed with pt and his mother.     IDP [facility] of Care Program  [id number]  This patient is  being evaluated for the need for intravenous antibiotics after discharge    Diagnosis requiring antibiotics: Empyema    ANTIBIOTIC RECOMMENDATION/DISCHARGE PLANNING  Recommendation for Definitive outpatient Antibiotic:  Ertapenem 1 gm IV daily until 05/17/18   Weekly CBC, CMP   Discharge Destination:   Home with family    Discharge Planning:       ID requirements for Discharge: Line, home IVAB to be set up    Follow up ID appointment placed: Pt will call    Discussed with Case Management: Not yet   Home Health Face-to-Face Placed: 04/29/18    IV ACCESS RECOMMENDATIONS: IV Access Status: ordered  Preferred Post Discharge IV Access: midline      For any questions regarding home outpatient antibiotics, please call [phone]      Lines:     Patient Lines/Drains/Airways Status    Active Lines, Drains and Airways     Name:   Placement date:   Placement time:   Site:   Days:    Peripheral IV 04/25/18 Left Antecubital   04/25/18    2146    Antecubital   9    Chest Tube 1 Other (Comment) 28 Fr.   04/22/18    2210    --   12                Family History:  Family History   Problem Relation Age of Onset    ADD / ADHD Father     ADD / ADHD Brother     Cancer Paternal Grandfather 73        bladder, smoker    Pulmonary fibrosis Maternal Grandmother         also with ITP    Alzheimer's disease Paternal Grandmother 37       Social History:     Social History     Socioeconomic History    Marital status: Single     Spouse name: Not on file    Number of children: 0    Years of education: Not on file    Highest education level: Not on file   Occupational History    Occupation: Archivist   Social Needs    Financial resource strain: Not on file    Food insecurity:     Worry: Not on file     Inability: Not on file    Transportation needs:     Medical: Not on file     Non-medical: Not on file   Tobacco Use    Smoking status: Former Smoker    Smokeless tobacco: Never Used    Tobacco comment: quit 1 week ago    Substance and Sexual Activity    Alcohol use: Not Currently     Alcohol/week: 0.0 standard drinks    Drug use: No    Sexual activity: Yes     Partners: Female     Comment: monogamous   Lifestyle    Physical activity:     Days per week: Not on file     Minutes per session: Not on file    Stress: Not on file   Relationships    Social connections:     Talks on phone: Not on file     Gets together: Not on file     Attends religious service: Not on file     Active member of club or organization: Not on file     Attends meetings of clubs or organizations: Not on file     Relationship status: Not on file    Intimate partner violence:     Fear of current or ex partner: Not on file     Emotionally abused: Not on file     Physically abused: Not on file     Forced sexual activity: Not on file   Other Topics Concern    Not on file   Social History Narrative    Taking a break from school    Wanting to transfer for the fall    Working at [employer]        Exercise: no     Diet: nothing special, tries to avoid GERD inducing foods       Allergies:     Allergies   Allergen Reactions    Other      Environmental allergies       Review of Systems:   Denies fevers, chills, ns, n/v/d, abdominal pain, sob, cp.     Physical Exam:     Vitals:    05/05/18 1040   BP: 129/69   Pulse: 99   Resp: 21   Temp: 97.7 F (36.5 C)   SpO2: 97%       General Appearance: alert and appropriate, non-toxic  Neuro: alert, oriented, normal speech, normal attention and cognition  HEENT: no scleral icterus, pupils round and reactive, OP clear  Neck: supple  Lungs: dim bases on the L-side; left CT    Cardiac: normal rate, regular rhythm, normal S1, S2,   Abdomen: soft, non-tender, non-distended   Extremities: no pedal edema  Skin: no rash  Psych: normal mood and affect    Labs:     Lab Results   Component Value Date    WBC 16.36 (H) 05/05/2018    HGB 13.5 05/05/2018    HCT 40.9 05/05/2018    MCV 81.6 05/05/2018    PLT 782 (H) 05/05/2018     Lab  Results   Component Value Date    CREAT 0.8 05/05/2018     Lab Results   Component Value Date    ALT 14 05/05/2018    AST 18 05/05/2018    ALKPHOS 114 (H) 05/05/2018    BILITOTAL 0.4 05/05/2018     No results found for: LACTATE    Microbiology:     Microbiology Results     Procedure Component Value Units Date/Time    AFB Culture & Smear [[id number]] Collected:  04/19/18 1053    Specimen:  Sputum from Pleural Fluid Updated:  04/27/18 2006    Narrative:       ORDER#: [id number]                                    ORDERED BY: Alinda Money  SOURCE: Pleural Fluid left                           COLLECTED:  04/19/18 10:53  ANTIBIOTICS AT COLL.:                                RECEIVED :  04/19/18 15:56  Stain, Acid Fast                           FINAL       04/19/18 19:10  04/19/18   No Acid Fast Bacillus Seen  Culture Acid Fast Bacillus (AFB)           PRELIM      04/27/18 20:04  04/27/18   No growth after 1 week/s of incubation.      AFB culture and smear [[id number]] Collected:  04/22/18 2122    Specimen:  Sputum from Tissue, Deep Updated:  04/23/18 1132    Narrative:       ORDER#: [id number]                                    ORDERED BY: Dorothey Baseman, SANDE  SOURCE: Tissue, Deep LEFT ABCESS CAVITY              COLLECTED:  04/22/18 21:22  ANTIBIOTICS AT COLL.:                                RECEIVED :  04/23/18 02:26  Stain, Acid Fast                           FINAL       04/23/18 11:32  04/23/18   No Acid Fast Bacillus Seen  Culture Acid  Fast Bacillus (AFB)           PENDING      AFB culture and smear [[id number]] Collected:  04/22/18 2040    Specimen:  Sputum from Tissue, Deep Updated:  04/23/18 1132    Narrative:       ORDER#: [id number]                                    ORDERED BY: Dorothey Baseman, SANDE  SOURCE: Tissue, Deep LEFT PLEURAL WITH DEBRIS        COLLECTED:  04/22/18 20:40  ANTIBIOTICS AT COLL.:                                RECEIVED :  04/23/18 02:30  Stain, Acid Fast                           FINAL       04/23/18  11:32  04/23/18   No Acid Fast Bacillus Seen  Culture Acid Fast Bacillus (AFB)           PENDING      Anaerobic culture [[id number]] Collected:  04/22/18 2040    Specimen:  Other from Tissue Updated:  04/27/18 0729    Narrative:       ORDER#: [id number]                                    ORDERED BY: Dorothey Baseman, SANDE  SOURCE: Tissue LEFT PLEURAL WITH DEBRIS              COLLECTED:  04/22/18 20:40  ANTIBIOTICS AT COLL.:                                RECEIVED :  04/23/18 02:30  Culture, Anaerobic Bacteria                FINAL       04/27/18 07:29  04/27/18   No anaerobic growth      Anaerobic culture [[id number]] Collected:  04/22/18 2122    Specimen:  Other from Abscess Updated:  04/25/18 1144    Narrative:       ORDER#: [id number]                                    ORDERED BY: Dorothey Baseman, SANDE  SOURCE: Abscess LEFT ABCESS CAVITY                   COLLECTED:  04/22/18 21:22  ANTIBIOTICS AT COLL.:                                RECEIVED :  04/23/18 02:26  Culture, Anaerobic Bacteria                PRELIM      04/25/18 11:44  04/25/18   No growth to date, final report to follow      CULTURE + Dierdre Forth [[id number]] Collected:  04/17/18 1532    Specimen:  Sputum, Induced Updated:  04/19/18 1829  Narrative:       ORDER#: [id number]                                    ORDERED BY: Timothy Lasso  SOURCE: Sputum, Induced induce sputum                COLLECTED:  04/17/18 15:32  ANTIBIOTICS AT COLL.:                                RECEIVED :  04/17/18 18:14  Stain, Gram (Respiratory)                  FINAL       04/17/18 19:57  04/17/18   Moderate WBC's             Few Squamous epithelial cells             Moderate Mixed Respiratory Flora  Culture and Gram Stain, Aerobic, RespiratorFINAL       04/19/18 18:29  04/19/18   Moderate growth of mixed upper respiratory flora      Culture + Gram Stain,Aerobic, Body Fluid [[id number]] Collected:  04/19/18 1053    Specimen:  Body Fluid from Pleural Fluid Updated:  04/23/18  1525    Narrative:       ORDER#: [id number]                                    ORDERED BY: Alinda Money  SOURCE: Pleural Fluid left                           COLLECTED:  04/19/18 10:53  ANTIBIOTICS AT COLL.:                                RECEIVED :  04/19/18 15:55  Stain, Gram                                FINAL       04/19/18 18:12  04/19/18   Moderate WBCs             No organisms seen  Culture and Gram Stain, Aerobic, Body FluidFINAL       04/23/18 15:25  04/23/18   No growth      Fungus culture [[id number]] Collected:  04/22/18 2122    Specimen:  Other from Tissue Updated:  04/23/18 1130    Narrative:       ORDER#: [id number]                                    ORDERED BY: Dorothey Baseman, SANDE  SOURCE: Tissue LEFT ABCESS CAVITY                    COLLECTED:  04/22/18 21:22  ANTIBIOTICS AT COLL.:                                RECEIVED :  04/23/18 02:26  Stain, Fungal  FINAL       04/23/18 11:29  04/23/18   No Fungal or Yeast Elements Seen  Culture Fungus                             PENDING      Fungus culture [[id number]] Collected:  04/22/18 2040    Specimen:  Other from Biopsy Updated:  04/23/18 1126    Narrative:       [id number] called Micro Results of pos fungal stain. Results read back [id number],  by [id number] on 04/23/2018 at 11:26  ORDER#: [id number]                                    ORDERED BY: HQIONGEX, SANDE  SOURCE: Biopsy LEFT PLEURAL WITH DEBRIS              COLLECTED:  04/22/18 20:40  ANTIBIOTICS AT COLL.:                                RECEIVED :  04/23/18 02:30  [id number] called Micro Results of pos fungal stain. Results read back [id number], by [id number] on 04/23/2018 at 11:26  Stain, Fungal                              FINAL       04/23/18 11:26   +  04/23/18   Many Septate Hyphae Seen  Culture Fungus                             PENDING      Legionella antigen, urine [[id number]] Collected:  04/20/18 2004    Specimen:  Urine, Catheterized, Foley Updated:  04/21/18 0403    Narrative:        ORDER#: [id number]                                    ORDERED BY: Geraldine Solar  SOURCE: Urine, Catheterized, Foley                   COLLECTED:  04/20/18 20:04  ANTIBIOTICS AT COLL.:                                RECEIVED :  04/20/18 23:45  Legionella, Rapid Urinary Antigen          FINAL       04/21/18 04:03  04/21/18   Negative for Legionella pneumophila Serogroup 1 Antigen             Limitations of Test:             1. Negative results do not exclude infection with Legionella                pneumophila Serogroup 1.             2. Does not detect other serogroups of L. pneumophila                or other Legionella species.             Test Reference Range:  Negative      Presurgical Surveillance, Delta [[id number]] Collected:  04/20/18 1630    Specimen:  Nares Updated:  04/21/18 1619     Culture Staph and MRSA Surveillance --     No Staph aureus isolated and  No Methicillin Resistant Staph aureus isolated      Narrative:       Please swab nares and throat    Presurgical Surveillance, MSSA+MRSA [[id number]] Collected:  04/20/18 1630    Specimen:  Throat Updated:  04/21/18 1619     Culture Staph and MRSA Surveillance --     No Staph aureus isolated and  No Methicillin Resistant Staph aureus isolated      S. PNEUMONIAE, RAPID URINARY ANTIGEN [[id number]] Collected:  04/20/18 2004    Specimen:  Urine, Clean Catch Updated:  04/21/18 0402    Narrative:       ORDER#: [id number]                                    ORDERED BY: Geraldine Solar  SOURCE: Urine, Clean Catch                           COLLECTED:  04/20/18 20:04  ANTIBIOTICS AT COLL.:                                RECEIVED :  04/20/18 23:45  S. pneumoniae, Rapid Urinary Antigen       FINAL       04/21/18 04:02  04/21/18   Negative for Streptococcus pneumoniae Urinary Antigen             Note:             This is a presumptive test for the direct qualitative             detection of bacterial antigen. This test is not intended as             a substitute for a  gram stain and bacterial culture. Samples             with extremely low levels of antigen may yield negative             results.             Reference Range: Negative      Wound culture & gram stain [[id number]] Collected:  04/22/18 2122    Specimen:  Wound from Abscess Updated:  04/27/18 1226    Narrative:       ORDER#: [id number]                                    ORDERED BY: Dorothey Baseman, SANDE  SOURCE: Abscess LEFT ABCESS CAVITY                   COLLECTED:  04/22/18 21:22  ANTIBIOTICS AT COLL.:                                RECEIVED :  04/23/18 02:26  Stain, Gram  FINAL       04/23/18 03:42  04/23/18   No Squamous epithelial cells seen             Moderate WBCs             No organisms seen  Culture and Gram Stain, Aerobic, Wound     PRELIM      04/27/18 12:26   +  04/24/18   Culture no growth to date, Final report to follow  04/25/18   Light growth of Mycelial Fungus               Refer to Identification on culture #[id number]        Wound culture & gram stain [[id number]] Collected:  04/22/18 2040    Specimen:  Wound Updated:  04/27/18 1226    Narrative:       ORDER#: [id number]                                    ORDERED BY: Dorothey Baseman, SANDE  SOURCE: Wound LEFT PLEURAL WITH DEBRIS               COLLECTED:  04/22/18 20:40  ANTIBIOTICS AT COLL.:                                RECEIVED :  04/23/18 02:30  Stain, Gram                                FINAL       04/23/18 05:06  04/23/18   No Squamous epithelial cells seen             Many WBCs             No organisms seen  Culture and Gram Stain, Aerobic, Wound     PRELIM      04/27/18 12:26   +  04/24/18   Culture no growth to date, Final report to follow  04/25/18   Moderate growth of Mycelial Fungus               Identification to follow              Rads:   X-ray Chest Ap Portable    Result Date: 05/05/2018   Slightly increased left hydropneumothorax with chest tube in place. [name], MD 05/05/2018 6:33 AM    Xr Chest Ap  Portable    Result Date: 05/04/2018   Increased left pneumothorax, now large in size, after placing chest tube to waterseal. Critical result was called to and acknowledged by short stay nurse Kat at 5:50 PM on the day of the exam. [name], MD 05/04/2018 5:50 PM      Signed by: Cleone Slim, NP

## 2018-05-05 NOTE — Discharge Summary (Signed)
DISCHARGE NOTE    Date Time: 05/05/18 2:38 PM  Patient Name: Craig Phelps  Attending Physician: Madilyn Hook, MD      Date of Admission:   04/16/2018    Date of Discharge:   05/05/18     Reason for Admission:   Seizure [R56.9]  Pneumothorax [J93.9]  Pneumothorax, unspecified type [J93.9]    Discharge Dx:   Present on Admission:   Pneumothorax   Seizure   GERD (gastroesophageal reflux disease)  Lung abscess  Lung empyema  Trapped Lung    Consultations:   Neurology  Infectious disease: Jon Billings  Pulmonology    Procedures performed:   Left VATS, complete decortication    HPI:   Craig Phelps is a 23 y.o. male with history of two prior seizures who presented to the hospital on 2/27 after having a seizure with associated fevers, chills, coughing and nausea found to have a left pneumothorax initially treated with chest tube placement. CT chest obtained in the ED demonstrated left hydropneumothorax with a left lung abscess. He has drained approximately 2L of fluid since the chest tube placement with interval resolution of fevers, nausea, and coughing. However, he continues to have an air leak with persistent small left pneumothorax without associated chest pain or SOB. Thoracic Surgery consulted by Dr. Harland Dingwall for consideration of left decortication given pleural fluid analysis (consistent with parapneumonic effusion).    Mr. Mangold reports onset of URI symptoms with coughing in November. His symptoms progressively worsened in early February with onset of fevers, chills, and overall malaise. He was evaluated by his PCP and given inhalers with no improvement in his symptoms. Subsequently last week he starting have nausea and vomiting followed by a seizure on Wednesday that prompted his visit to the ED. He reports smoking 1-2 cigarettes per day for 6 years. He also reports remote history of vaping but not in the past year. He denies any sick contacts or recent travel outside of the country.    Hospital  Course:   Patient was admitted to the hospital under the medical service on 2/27 after having seizure after an associated fevers, chills, cough. He was found to have a pneumothorax on the left. It was treated with a chest tube. Initial CT imaging demonstrated a left lung abscess with a hydropneumothorax. Infectious disease and pulmonology were consulted to help manage. Patient was on broad spectrum antibiotics. Patient had continued hydropneumothorax and thoracic surgery was subsequently consulted for possible operative intervention. Patient underwent the above listed procedure. Please operative report for full details. Pathology and cultures from the operating room were sent revealing no bacterial growth, but +fungal growth for mycelial fungus. Once cultures revealed +fungal cultures he was started on voriconazole.    Post-operatively, chest tube remained in placed on continuous suction until POD#12 due to a persistent air leak despite aggressive pulm toilet and adequate antibiotic/ antifungal coverage. Patient underwent a water seal trial, which demonstrated a 20% pneumothorax but patient was asymptomatic therefore left on water seal overnight. The following day, he was switched to a mini-atrium and after ambulating a CXR was obtained showing resolution of his pneumothorax.    Home IV ertapenem and voriconazole were established. Patient was discharged on POD#13 with close thoracic and ID follow up with chest tube in place connected to mini-atrium.    Discharge Medications:        Medication List      START taking these medications    acetaminophen 325 MG tablet  Commonly known  as:  TYLENOL  Take 2 tablets (650 mg total) by mouth every 6 (six) hours     ertapenem 1,000 mg in sodium chloride 0.9 % 100 mL IVPB mini-bag plus  Infuse 1,000 mg into the vein daily  Start taking on:  May 06, 2018     traMADol 50 MG tablet  Commonly known as:  ULTRAM  Take 1 tablet (50 mg total) by mouth every 6 (six) hours as needed for  Pain     voriconazole 200 MG tablet  Commonly known as:  VFEND  Take 1 tablet (200 mg total) by mouth every 12 (twelve) hours for 16 days        CONTINUE taking these medications    albuterol 108 (90 Base) MCG/ACT inhaler  Commonly known as:  PROVENTIL HFA;VENTOLIN HFA     benzonatate 100 MG capsule  Commonly known as:  TESSALON     fluticasone 50 MCG/ACT nasal spray  Commonly known as:  FLONASE     ondansetron 4 MG disintegrating tablet  Commonly known as:  ZOFRAN-ODT  Take 1 tablet (4 mg total) by mouth every 8 (eight) hours as needed for Nausea           Where to Get Your Medications      You can get these medications from any pharmacy    Bring a paper prescription for each of these medications   acetaminophen 325 MG tablet   traMADol 50 MG tablet   voriconazole 200 MG tablet     Information about where to get these medications is not yet available    Ask your nurse or doctor about these medications   ertapenem 1,000 mg in sodium chloride 0.9 % 100 mL IVPB mini-bag plus         Disposition:   Home     Discharge Instructions:   Activity: activity as tolerated  Diet: regular diet  Wound Care: keep wound clean and dry and reinforce dressing PRN    Follow Up:   Asencion Noble, MD  [address]  [place] [address]  [phone]    Follow up      Madilyn Hook, MD  [address]  [place] [address]  [phone]    Go on 05/12/2018  @ 9:30 AM with pre clinic chest xray      Signed by: Daymon Larsen

## 2018-05-05 NOTE — Progress Notes (Signed)
This note also relates to the following rows which could not be included:  LACE Score for RWB Report - Cannot attach notes to read only rows       05/05/18 1413   Discharge Disposition   Patient preference/choice provided? Yes   Physical Discharge Disposition Home, Home Health   Mode of Transportation Car   Patient/Family/POA notified of transfer plan Yes   Patient agreeable to discharge plan/expected d/c date? Yes   Family/POA agreeable to discharge plan/expected d/c date? Yes   Bedside nurse notified of transport plan? Yes   CM Interventions   Follow up appointment scheduled? No   Reason no follow up scheduled? Family to schedule   Multidisciplinary rounds/family meeting before d/c? No   Medicare Checklist   Is this a Medicare patient? No   Patient received 1st IMM Letter? n/a   Patient is agreeable to returning home today home with home iV ABX    Craig Phelps [facility] Discharge Planner  [facility]  [phone]  [email or web address]

## 2018-05-05 NOTE — Progress Notes (Signed)
D/c instructions given, pt verbalized understanding  IV removed by vascular nurse  Midline in LUE  Pt will walk out to car for d/c home

## 2018-05-05 NOTE — Progress Notes (Signed)
Message left for [facility] Clydie Braun [phone] alerting her of patient's discharge date    Grace Blight [facility] Discharge Planner  [facility]  [phone]  [email or web address]

## 2018-05-05 NOTE — Consults (Signed)
ARROW POWER MIDLINE INSERTION PROCEDURE       INDICATIONS: Antibiotic    The midline procedure, risks, benefits were discussed with thepatient.  All questions were answered  patient verbalized understanding and agreed to proceed. Midline education/instructions provided to patient.    PROCEDURE DETAILS:   The patient was positioned and Ultrasound was used to confirm patency of the Left Basilic vein prior to obtaining venous access. The arm was scrubbed with 2% chlorhexidine per guidelines and a maximal sterile field was established for the patient.  The clinician was attired with cap, mask and sterile gown/gloves prior to start.     Arrow Power Midline:    A sterile cover was sheathed to the Ultrasound probe.  The vein was then revisualized and 1% lidocaine injected prior to puncture of the Left Basilic vein with a 21-gauge single-wall needle under direct sonographic guidance.  The guidewire was advanced through the needle, the needle was removed and a peel-away sheath was placed over the wire.  After measuring from the insertion site to the midline of the upper arm the catheter was trimmed to insure tip location below the axillary.  The catheter was then advanced through the peel-away sheath.  The sheath was removed.  The catheter was flushed with normal saline to confirm brisk blood return and capped.  The catheter was stabilized on the skin using a securement device.  Sterile transparent occlusive dressing applied using aseptic technique. Gauze dressing applied. Informed RN to change gauze dressing in 48 hours.    Patient did tolerate the procedure well.   Catheter Type: 4Fr Arrow Power Midline  Insertion Site: Left Basilic vein  Total length: 15cm  Internal Length:  15cm  External Length: 0cm  UAC: 25cm    Midline Reference #: [id number]  Midline Kit Lot#: [id number]  Midline Kit expiration date: 05-19-2019    Findings/Conclusions:  No signs of bleeding or symptoms of nerve irritation noted at time of  insertion procedure.    Tip location is below the level of the axilla in Basilic vein. Midline is ready for immediate use.    Midline is ready for immediate use    Patrici Ranks, RN

## 2018-05-05 NOTE — Progress Notes (Signed)
Nutrition Screen:  Reason: LOS    Intervention:   Pt's medical status, pert labs and meds reviewed. No acute nutrition issues or learning needs identified at this time (no nutrition dx or additional intervention/goals needed).     Clinical Assessment/Progress:   23 year old male with left lung abscess and empyema s/p pigtail 2/27 with a persistent left hydropneumothorax now 13 days post op s/p left decortication.     Per nursing documentation, pt consuming 100% of meals. No significant recent weight loss x 1 year. No n/v/d/c.     Pertinent Labs: reviewed  Pertinent Meds: protonix    Current Nutrition Prescription:   Diet/Supplement Order: regular  Weight Monitoring Weight Weight Method   10/28/2016 71.85 kg    04/14/2018 70.035 kg    04/15/2018 70.308 kg Stated   04/16/2018 70.3 kg    04/17/2018 70.3 kg Bed Scale   04/18/2018 (No Data)    04/21/2018 69.4 kg Standing Scale     %Weight Change: no significant recent weight loss per epic weight monitoring      M/E:  Monitor po, med tx plan as needed. (Please consult RD if nutrition issue arises)     Craig Phelps, [place]  Spectra [phone]

## 2018-05-05 NOTE — Plan of Care (Signed)
Problem: Moderate/High Fall Risk Score >5  Goal: Patient will remain free of falls  Outcome: Adequate for Discharge  Flowsheets (Taken 04/29/2018 2100 by Lynne Leader, RN)  Moderate Risk (6-13): LOW-Fall Interventions Appropriate for Low Fall Risk     Problem: Safety  Goal: Patient will be free from injury during hospitalization  Outcome: Adequate for Discharge  Flowsheets (Taken 05/05/2018 1424)  Patient will be free from injury during hospitalization : Assess patient's risk for falls and implement fall prevention plan of care per policy; Provide and maintain safe environment; Use appropriate transfer methods; Hourly rounding; Include patient/ family/ care giver in decisions related to safety; Ensure appropriate safety devices are available at the bedside; Assess for patients risk for elopement and implement Elopement Risk Plan per policy  Goal: Patient will be free from infection during hospitalization  Outcome: Adequate for Discharge  Flowsheets (Taken 05/05/2018 1424)  Free from Infection during hospitalization: Assess and monitor for signs and symptoms of infection; Monitor lab/diagnostic results; Monitor all insertion sites (i.e. indwelling lines, tubes, urinary catheters, and drains); Encourage patient and family to use good hand hygiene technique     Problem: Discharge Barriers  Goal: Patient will be discharged home or other facility with appropriate resources  Outcome: Adequate for Discharge  Flowsheets (Taken 05/05/2018 1424)  Discharge to home or other facility with appropriate resources: Provide appropriate patient education; Provide information on available health resources; Initiate discharge planning     Problem: Inadequate Gas Exchange  Goal: Adequate oxygenation and improved ventilation  Outcome: Adequate for Discharge  Flowsheets (Taken 05/05/2018 1424)  Adequate oxygenation and improved ventilation: Assess lung sounds; Monitor SpO2 and treat as needed; Increase activity as  tolerated/progressive mobility; Consult/collaborate with Respiratory Therapy  Goal: Patent Airway maintained  Outcome: Adequate for Discharge  Flowsheets (Taken 05/05/2018 1424)  Patent airway maintained : Position patient for maximum ventilatory efficiency; Provide adequate fluid intake to liquefy secretions; Reinforce use of ordered respiratory interventions (i.e. CPAP, BiPAP, Incentive Spirometer, Acapella, etc.)     Problem: Inadequate Airway Clearance  Goal: Normal respiratory rate/effort achieved/maintained  Outcome: Adequate for Discharge  Flowsheets (Taken 05/05/2018 1424)  Normal respiratory rate/effort achieved/maintained: Plan activities to conserve energy: plan rest periods     Pt A/Ox4, c/o discomfort around chest tube - admin tylenol w/ relief  No c/o n/v, SOB, CP, HA  Medications admin per order  Chest tube atrium changed to mini atrium by thoracic team this morning w/ small serosanguinous output  Ambulates well, tolerating diet, voiding well  Need midline placed prior to d/c   Will prepare pt for d/c after midline placement

## 2018-05-05 NOTE — Progress Notes (Signed)
Thoracic Surgery Daily Progress Note  [facility] - Thoracic Surgery   Team Spectra: [phone] / [phone]    Hospital Day: Hospital Day: 20  Post Operative Day: 13 Days Post-Op   Primary Procedure: Procedure(s) (LRB):  THORACOSCOPY, (VATS), DECORTICATION (Left)     Assessment:   Craig Phelps is a 23 y.o. male with left lung abscess and empyema s/p pigtail 2/27 with a persistent left hydropneumothorax now 13 Days Post-Op s/p left VATS, decortication. Peural cultures w/ Mycelial fungus.   -- placed to WS POD#12 (3/16) with slight PTX, slightly increased this morning CXR before ambulating    Plan:   -- continue left chest tube to water seal  -- will switch atrium to mini atrium and repeat CXR around noon  -- will order mid-line today  -- continue ertapenem & voriconazole per ID, FTF placed by ID  -- daily morning chest x-ray  -- tylenol & PRN tramadol for pain control  -- continue aggressive pulm toiler & ambulation  -- possible [place] tomorrow if continues to do well and PTX not worsened with mini-atrium    Interval History:   CT placed to [facility] yesterday with 20% pneumothorax on repeat CXR. Kept to WS overnight. Denies SOB but does endorse slight "increased resistance" when taking a deep breath, which improves over time. Denies any CP, dizziness, lightheadedness, palpitations.    Physical Exam:     Vitals:    05/04/18 1612 05/04/18 2001 05/04/18 2300 05/05/18 0551   BP: 128/84 140/77 117/80 108/78   Pulse: 94 99 (!) 102 88   Resp: 19 18 18 18    Temp: 97 F (36.1 C) (!) 96.4 F (35.8 C) 97.1 F (36.2 C) (!) 96.6 F (35.9 C)   TempSrc: Oral Oral Oral Oral   SpO2: 98% 98% 98% 98%   Weight:       Height:            PHYSICAL EXAM:  General: AAOx3, NAD  HEENT: Atraumatic, normocephalic, PERRLA  CV: RRR  Pulm: CTAB, chest tube on water seal w/ intermittent air leak on forceful expiration and cough, no air leak with deep inhalation    Lines/Drains:   Left chest tube: 110cc SS output, air leak intermittent on water seal    Laboratory and  Radiology Results:     Recent Labs   Lab 04/30/18  1213   Sodium 140   Potassium 4.7   Chloride 103   CO2 22   BUN 14.0   Creatinine 0.9   Calcium 10.3   Magnesium 2.3   Glucose 67*                 X-ray Chest Ap Portable    Result Date: 05/05/2018   Slightly increased left hydropneumothorax with chest tube in place. Craig Isaacs, MD 05/05/2018 6:33 AM    Xr Chest Ap Portable    Result Date: 05/04/2018   Increased left pneumothorax, now large in size, after placing chest tube to waterseal. Critical result was called to and acknowledged by short stay nurse Kat at 5:50 PM on the day of the exam. Craig Solian, MD 05/04/2018 5:50 PM       Craig Sorrow, MD  Thoracic Surgery, R4      ** PLEASE DO NOT PAGE - CALL SPECTRA FIRST IF QUESTIONS **    -------------------------------------------------------------------------------------------------------------------

## 2018-05-05 NOTE — Progress Notes (Addendum)
1552Herbert Seta met again with patient and discussed home IV service.  Medication will be delivered this evening and SOC with [facility] nurse will be tomorrow, 05/06/18.      Informed by case manager, Grace Blight, that patient is to be discharged today.  Patient continues on IV ABX and a new F2F was entered on 04/29/18.  Will inform Heather from [facility]. No needs other than the IVs.    Betha Loa, RN  Post Acute Care Coordinator  [phone]

## 2018-05-12 ENCOUNTER — Other Ambulatory Visit: Payer: Self-pay | Admitting: Specialist

## 2018-05-18 LAB — FUNGAL IDENTIFICATION, MOLDS

## 2018-05-19 ENCOUNTER — Other Ambulatory Visit: Payer: Self-pay | Admitting: Family

## 2018-05-20 LAB — MISCELLANEOUS QUEST TEST

## 2018-05-20 LAB — FUNGAL IDENTIFICATION, MOLDS

## 2018-05-26 ENCOUNTER — Other Ambulatory Visit: Payer: Self-pay | Admitting: Family

## 2018-06-05 ENCOUNTER — Other Ambulatory Visit: Payer: Self-pay | Admitting: Family

## 2018-06-05 ENCOUNTER — Ambulatory Visit
Admission: RE | Admit: 2018-06-05 | Discharge: 2018-06-05 | Disposition: A | Discharge status: Home / Self Care | Payer: BLUE CROSS/BLUE SHIELD | Source: Ambulatory Visit | Attending: Family | Admitting: Family

## 2018-06-05 ENCOUNTER — Other Ambulatory Visit: Payer: Self-pay | Admitting: Specialist

## 2018-06-05 DIAGNOSIS — J852 Abscess of lung without pneumonia: Secondary | ICD-10-CM

## 2018-06-05 DIAGNOSIS — IMO0002 Reserved for concepts with insufficient information to code with codable children: Secondary | ICD-10-CM

## 2018-06-05 DIAGNOSIS — J869 Pyothorax without fistula: Secondary | ICD-10-CM

## 2018-06-05 DIAGNOSIS — R918 Other nonspecific abnormal finding of lung field: Secondary | ICD-10-CM | POA: Insufficient documentation

## 2018-06-05 DIAGNOSIS — J939 Pneumothorax, unspecified: Secondary | ICD-10-CM

## 2018-06-07 NOTE — H&P (Signed)
THORACIC SERVICE ATTENDING NOTE    As the attending physician, I certify that I have seen and examined the patient and I have reviewed the notes, assessments, and radiological studies as outlined and amended.   CT imaging reveals smaller than expected PTX, ?visceral pleural rind, and smaller cavity with obvious defect. Discussed with patient and mother. Operative intervention intended to wash out space, obtain new cultures to appropriate direct abx management, possible abscess resection vs. Maneuvers to close airleak, and possible decortication. Indications, risks, benefits and alternatives discussed in detail with patient and family. Have also discussed case with Dr. Lorel Monaco and multiple pulmonologistsAll questions answered and all members of the team in agreement to proceed.        Madilyn Hook, MD  Thoracic Surgery  [phone]          SURGICAL HISTORY and PHYSICAL  [facility] - Thoracic Surgery  Team Spectralink: [phone] / [phone]      Date Time: 06/07/18 9:18 PM  Attending Physician: Marijean Heath,  MD      History of Presenting Illness:   - Craig Phelps is a 23 y.o. male with past medical h/o seizure disorder who presented to the hospital on April 16, 2018 after having a seizure after upper respiratory infection and was found to have a left pneumothorax. He underwent pigtail placement and persisted with a left hydropneumothorax and lung abscess.     On April 22, 2018 he underwent operative intervention of a left thoracoscopic complete decortication. Operative findings included an entrapped lung, multiloculated mucopurulent and gelatinous pleural fluid collection as well as an abscess in the fissure between the upper and lower lobes of the left lung. 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 After an approximately 14-day postoperative hospital stay he was discharged home with his left chest tube in place  due to a persistent air leak. His outpatient antibiotics and antifungals which were managed by Dr. Micheline Rough.     His outpatient course since discharge has been comprised of weekly visits to assess his chest tube output and his persistent air leak. He has had chest x-rays every 7-10 days with each one showing a moderate to large left pneumothorax. At his office visit on Friday April 17 his chest x-ray once again demonstrates a moderate to large left pneumothorax. It was noted that his chest tube had significant buildup of thick purulent drainage and that the character of the drainage had changed from serous to significantly purulent. Of note, his ertapenem was stopped approximately 2 weeks ago although he continues to take his antifungal. He remains afebrile and is without constitutional complaints. Due to concern for re-demonstration of his empyema he presents today for operative intervention.     Past Medical History:  Past Medical History:   Diagnosis Date    ADHD (attention deficit hyperactivity disorder)     took Adderall in highschool and college but sopped    ENT disease     Pollen allergies/sneezing    Fractures     rt wrist x 2    GERD (gastroesophageal reflux disease)     occ/prilosec OTC    H1N1 influenza 2009    Pilonidal cyst 2014    Seizure        Past Surgical History:  Past Surgical History:   Procedure Laterality Date    CYSTECTOMY, PILONIDAL  08/24/2012    Procedure: CYSTECTOMY, PILONIDAL;  Surgeon: Despina Hidden, MD;  Location:  [place] [facility];  Service: General;  Laterality: N/A;    THORACOSCOPY, (VATS), DECORTICATION Left 04/22/2018    Procedure: THORACOSCOPY, (VATS), DECORTICATION;  Surgeon: Madilyn Hook, MD;  Location: [facility];  Service: Cardiothoracic;  Laterality: Left;  LEFT VATS DECORTICATION    WISDOM TOOTH EXTRACTION  2014       Allergies:  Allergies   Allergen Reactions    Other      Environmental allergies       Home Medications:  No current  facility-administered medications on file prior to encounter.      Current Outpatient Medications on File Prior to Encounter   Medication Sig Dispense Refill    acetaminophen (TYLENOL) 325 MG tablet Take 2 tablets (650 mg total) by mouth every 6 (six) hours 10 tablet 0    albuterol (PROVENTIL HFA;VENTOLIN HFA) 108 (90 Base) MCG/ACT inhaler as needed      benzonatate (TESSALON) 100 MG capsule as needed      ertapenem 1,000 mg in sodium chloride 0.9 % 100 mL IVPB mini-bag plus Infuse 1,000 mg into the vein daily 1000 mg 0    fluticasone (FLONASE) 50 MCG/ACT nasal spray 1 spray by Nasal route daily         Social History:  Social History     Socioeconomic History    Marital status: Single     Spouse name: Not on file    Number of children: 0    Years of education: Not on file    Highest education level: Not on file   Occupational History    Occupation: Archivist   Social Needs    Financial resource strain: Not on file    Food insecurity:     Worry: Not on file     Inability: Not on file    Transportation needs:     Medical: Not on file     Non-medical: Not on file   Tobacco Use    Smoking status: Former Smoker    Smokeless tobacco: Never Used    Tobacco comment: quit 1 week ago   Substance and Sexual Activity    Alcohol use: Not Currently     Alcohol/week: 0.0 standard drinks    Drug use: No    Sexual activity: Yes     Partners: Female     Comment: monogamous   Lifestyle    Physical activity:     Days per week: Not on file     Minutes per session: Not on file    Stress: Not on file   Relationships    Social connections:     Talks on phone: Not on file     Gets together: Not on file     Attends religious service: Not on file     Active member of club or organization: Not on file     Attends meetings of clubs or organizations: Not on file     Relationship status: Not on file    Intimate partner violence:     Fear of current or ex partner: Not on file     Emotionally abused: Not on file      Physically abused: Not on file     Forced sexual activity: Not on file   Other Topics Concern    Not on file   Social History Narrative    Taking a break from school    Wanting to transfer for the fall    Working at [employer]  Exercise: no     Diet: nothing special, tries to avoid GERD inducing foods       Family History:  Family History   Problem Relation Age of Onset    ADD / ADHD Father     ADD / ADHD Brother     Cancer Paternal Grandfather 70        bladder, smoker    Pulmonary fibrosis Maternal Grandmother         also with ITP    Alzheimer's disease Paternal Grandmother 51       Review of Systems: As per HPI. A complete 10 point review of systems was performed and all other systems reviewed were negative.    Physical Exam:  There were no vitals filed for this visit.    General: Appears healthy, no acute distress  Pulmonary: Diminished in left lateral and left apex, clear on right  Cardiovascular: RRR, no murmurs, rubs or gallops  Abdomen: Soft, non-tender, nondistended  Extremities: Warm, well perfused. No Edema    Labs and radiology:   - Reviewed prior to examination    Assessment:   - 23 y.o. male with h/o left lung abscess and empyema who is s/p left VATS and complete decortication. He has continued to have a persistent air leak and left pneumothorax. Due to change in the quality of the chest tube drainage there is concern for re-demonstration of his empyema and possible entrapped lung. He presents today for left redo VATS, decortication.    Plan:   - Will proceed with left redo thoracoscopic decortication   - Cefuroxime 1.5 grams for pre-operative antibiotics   - SCDs for mechanical DVT PPx   - Risks and benefits of surgery discussed with patient and informed consent obtained.      Melida Quitter, FNP-BC  Thoracic Surgery  [phone]

## 2018-06-08 ENCOUNTER — Inpatient Hospital Stay: Payer: BLUE CROSS/BLUE SHIELD | Admitting: Anesthesiology

## 2018-06-08 ENCOUNTER — Ambulatory Visit: Payer: BLUE CROSS/BLUE SHIELD

## 2018-06-08 ENCOUNTER — Inpatient Hospital Stay: Payer: BLUE CROSS/BLUE SHIELD

## 2018-06-08 ENCOUNTER — Encounter
Admission: RE | Disposition: A | Discharge status: Home Health | Payer: Self-pay | Source: Ambulatory Visit | Attending: Specialist

## 2018-06-08 ENCOUNTER — Inpatient Hospital Stay
Admission: RE | Admit: 2018-06-08 | Discharge: 2018-06-11 | DRG: 163 | Disposition: A | Discharge status: Home Health | Payer: BLUE CROSS/BLUE SHIELD | Source: Ambulatory Visit | Attending: Specialist | Admitting: Specialist

## 2018-06-08 DIAGNOSIS — Z87891 Personal history of nicotine dependence: Secondary | ICD-10-CM

## 2018-06-08 DIAGNOSIS — J869 Pyothorax without fistula: Secondary | ICD-10-CM | POA: Diagnosis present

## 2018-06-08 DIAGNOSIS — J852 Abscess of lung without pneumonia: Secondary | ICD-10-CM | POA: Diagnosis present

## 2018-06-08 DIAGNOSIS — J9383 Other pneumothorax: Principal | ICD-10-CM | POA: Diagnosis present

## 2018-06-08 DIAGNOSIS — K219 Gastro-esophageal reflux disease without esophagitis: Secondary | ICD-10-CM | POA: Diagnosis present

## 2018-06-08 DIAGNOSIS — J939 Pneumothorax, unspecified: Secondary | ICD-10-CM

## 2018-06-08 DIAGNOSIS — B382 Pulmonary coccidioidomycosis, unspecified: Secondary | ICD-10-CM | POA: Diagnosis present

## 2018-06-08 HISTORY — PX: THORACOSCOPY, (VATS), DECORTICATION: SHX5578

## 2018-06-08 LAB — BASIC METABOLIC PANEL
BUN: 12 mg/dL (ref 9.0–28.0)
CO2: 19 mEq/L — ABNORMAL LOW (ref 22–29)
Calcium: 9 mg/dL (ref 8.5–10.5)
Chloride: 101 mEq/L (ref 100–111)
Creatinine: 0.8 mg/dL (ref 0.7–1.3)
Glucose: 144 mg/dL — ABNORMAL HIGH (ref 70–100)
Potassium: 4.5 mEq/L (ref 3.5–5.1)
Sodium: 134 mEq/L — ABNORMAL LOW (ref 136–145)

## 2018-06-08 LAB — CBC
Absolute NRBC: 0 10*3/uL (ref 0.00–0.00)
Hematocrit: 35.5 % — ABNORMAL LOW (ref 37.6–49.6)
Hgb: 11.8 g/dL — ABNORMAL LOW (ref 12.5–17.1)
MCH: 25.9 pg (ref 25.1–33.5)
MCHC: 33.2 g/dL (ref 31.5–35.8)
MCV: 78 fL (ref 78.0–96.0)
MPV: 9.2 fL (ref 8.9–12.5)
Nucleated RBC: 0 /100 WBC (ref 0.0–0.0)
Platelets: 662 10*3/uL — ABNORMAL HIGH (ref 142–346)
RBC: 4.55 10*6/uL (ref 4.20–5.90)
RDW: 13 % (ref 11–15)
WBC: 20.95 10*3/uL — ABNORMAL HIGH (ref 3.10–9.50)

## 2018-06-08 LAB — GFR: EGFR: >60

## 2018-06-08 LAB — MAGNESIUM: Magnesium: 2.1 mg/dL (ref 1.6–2.6)

## 2018-06-08 SURGERY — THORACOSCOPY, (VATS), DECORTICATION
Anesthesia: Anesthesia General | Site: Chest | Laterality: Left | Wound class: Clean Contaminated

## 2018-06-08 MED ORDER — VANCOMYCIN HCL IN NACL 1.25-0.9 GM/250ML-% IV SOLN
1250.00 mg | Freq: Two times a day (BID) | INTRAVENOUS | Status: DC
Start: 2018-06-08 — End: 2018-06-09
  Administered 2018-06-08 – 2018-06-09 (×2): 1250 mg via INTRAVENOUS
  Filled 2018-06-08 (×4): qty 250

## 2018-06-08 MED ORDER — ONDANSETRON HCL 4 MG/2ML IJ SOLN
4.0000 mg | Freq: Once | INTRAMUSCULAR | Status: DC | PRN
Start: 2018-06-08 — End: 2018-06-08

## 2018-06-08 MED ORDER — STERILE WATER FOR IRRIGATION IR SOLN
Status: DC | PRN
Start: 2018-06-08 — End: 2018-06-08
  Administered 2018-06-08 (×2): 2000 mL

## 2018-06-08 MED ORDER — HYDROMORPHONE HCL 1 MG/ML IJ SOLN
INTRAMUSCULAR | Status: AC
Start: 2018-06-08 — End: ?
  Filled 2018-06-08: qty 1

## 2018-06-08 MED ORDER — FENTANYL CITRATE (PF) 50 MCG/ML IJ SOLN (WRAP)
INTRAMUSCULAR | Status: DC | PRN
Start: 2018-06-08 — End: 2018-06-08
  Administered 2018-06-08: 100 ug via INTRAVENOUS
  Administered 2018-06-08: 25 ug via INTRAVENOUS
  Administered 2018-06-08: 100 ug via INTRAVENOUS
  Administered 2018-06-08 (×3): 25 ug via INTRAVENOUS

## 2018-06-08 MED ORDER — LIDOCAINE HCL 2 % IJ SOLN
INTRAMUSCULAR | Status: DC | PRN
Start: 2018-06-08 — End: 2018-06-08
  Administered 2018-06-08: 100 mg via INTRAVENOUS

## 2018-06-08 MED ORDER — PIPERACILLIN SOD-TAZOBACTAM SO 4.5 (4-0.5) G IV SOLR
4.50 g | Freq: Four times a day (QID) | INTRAVENOUS | Status: DC
Start: 2018-06-08 — End: 2018-06-08

## 2018-06-08 MED ORDER — ONDANSETRON HCL 4 MG/2ML IJ SOLN
INTRAMUSCULAR | Status: DC | PRN
Start: 2018-06-08 — End: 2018-06-08
  Administered 2018-06-08: 4 mg via INTRAVENOUS

## 2018-06-08 MED ORDER — TRAMADOL HCL 50 MG PO TABS
50.0000 mg | ORAL_TABLET | Freq: Four times a day (QID) | ORAL | Status: DC | PRN
Start: 2018-06-08 — End: 2018-06-11
  Administered 2018-06-09 – 2018-06-11 (×8): 50 mg via ORAL
  Filled 2018-06-08 (×8): qty 1

## 2018-06-08 MED ORDER — TRAMADOL HCL 50 MG PO TABS
ORAL_TABLET | ORAL | Status: AC
Start: 2018-06-08 — End: 2018-06-08
  Administered 2018-06-08: 50 mg via ORAL
  Filled 2018-06-08: qty 1

## 2018-06-08 MED ORDER — ACETAMINOPHEN 10 MG/ML IV SOLN
1000.0000 mg | Freq: Once | INTRAVENOUS | Status: AC
Start: 2018-06-08 — End: 2018-06-08
  Administered 2018-06-08: 15:00:00 1000 mg via INTRAVENOUS
  Filled 2018-06-08: qty 100

## 2018-06-08 MED ORDER — FLUCONAZOLE 100 MG PO TABS
100.0000 mg | ORAL_TABLET | ORAL | Status: DC
Start: 2018-06-08 — End: 2018-06-08

## 2018-06-08 MED ORDER — MAGNESIUM SULFATE IN D5W 1-5 GM/100ML-% IV SOLN
1.00 g | Freq: Once | INTRAVENOUS | Status: AC
Start: 2018-06-08 — End: 2018-06-08
  Administered 2018-06-08: 20:00:00 1 g via INTRAVENOUS
  Filled 2018-06-08: qty 100

## 2018-06-08 MED ORDER — GLYCOPYRROLATE 0.2 MG/ML IJ SOLN
INTRAMUSCULAR | Status: AC
Start: 2018-06-08 — End: ?
  Filled 2018-06-08: qty 1

## 2018-06-08 MED ORDER — SODIUM CHLORIDE (PF) 0.9 % IJ SOLN
3.0000 mL | Freq: Three times a day (TID) | INTRAMUSCULAR | Status: DC
Start: 2018-06-08 — End: 2018-06-11
  Administered 2018-06-11: 02:00:00 3 mL via INTRAVENOUS

## 2018-06-08 MED ORDER — PROPOFOL 10 MG/ML IV EMUL (WRAP)
INTRAVENOUS | Status: AC
Start: 2018-06-08 — End: ?
  Filled 2018-06-08: qty 20

## 2018-06-08 MED ORDER — HYDROMORPHONE HCL 1 MG/ML IJ SOLN
INTRAMUSCULAR | Status: DC | PRN
Start: 2018-06-08 — End: 2018-06-08
  Administered 2018-06-08: 0.5 mg via INTRAVENOUS

## 2018-06-08 MED ORDER — CHLORHEXIDINE GLUCONATE 0.12 % MT SOLN
15.0000 mL | Freq: Once | OROMUCOSAL | Status: AC
Start: 2018-06-08 — End: 2018-06-08
  Administered 2018-06-08: 10:00:00 15 mL via OROMUCOSAL

## 2018-06-08 MED ORDER — BUPIVACAINE HCL (PF) 0.25 % IJ SOLN
INTRAMUSCULAR | Status: AC
Start: 2018-06-08 — End: 2018-06-08
  Filled 2018-06-08: qty 30

## 2018-06-08 MED ORDER — MAGNESIUM SULFATE 50 % IJ SOLN
INTRAMUSCULAR | Status: DC | PRN
Start: 2018-06-08 — End: 2018-06-08
  Administered 2018-06-08 (×2): 1 g via INTRAVENOUS

## 2018-06-08 MED ORDER — FENTANYL CITRATE (PF) 50 MCG/ML IJ SOLN (WRAP)
25.0000 ug | INTRAMUSCULAR | Status: DC | PRN
Start: 2018-06-08 — End: 2018-06-08

## 2018-06-08 MED ORDER — DEXAMETHASONE SODIUM PHOSPHATE 4 MG/ML IJ SOLN (WRAP)
INTRAMUSCULAR | Status: DC | PRN
Start: 2018-06-08 — End: 2018-06-08
  Administered 2018-06-08: 8 mg via INTRAVENOUS

## 2018-06-08 MED ORDER — PROPOFOL 10 MG/ML IV EMUL (WRAP)
INTRAVENOUS | Status: AC
Start: 2018-06-08 — End: ?
  Filled 2018-06-08: qty 100

## 2018-06-08 MED ORDER — FLUCONAZOLE 100 MG PO TABS
400.0000 mg | ORAL_TABLET | ORAL | Status: DC
Start: 2018-06-08 — End: 2018-06-11
  Administered 2018-06-08 – 2018-06-11 (×4): 400 mg via ORAL
  Filled 2018-06-08 (×7): qty 4

## 2018-06-08 MED ORDER — CEFUROXIME SODIUM 1.5 G IJ/IV SOLR (WRAP)
1.5000 g | Freq: Once | Status: AC
Start: 2018-06-08 — End: 2018-06-08
  Administered 2018-06-08 (×2): 1.5 g via INTRAVENOUS

## 2018-06-08 MED ORDER — ROCURONIUM BROMIDE 50 MG/5ML IV SOLN
INTRAVENOUS | Status: AC
Start: 2018-06-08 — End: ?
  Filled 2018-06-08: qty 5

## 2018-06-08 MED ORDER — HYDROMORPHONE HCL 2 MG PO TABS
2.0000 mg | ORAL_TABLET | Freq: Once | ORAL | Status: DC | PRN
Start: 2018-06-08 — End: 2018-06-08

## 2018-06-08 MED ORDER — BUPIVACAINE HCL 0.25 % IJ SOLN
INTRAMUSCULAR | Status: DC | PRN
Start: 2018-06-08 — End: 2018-06-08
  Administered 2018-06-08: 30 mL

## 2018-06-08 MED ORDER — CEFUROXIME SODIUM 1.5 G IJ/IV SOLR (WRAP)
Status: AC
Start: 2018-06-08 — End: ?
  Filled 2018-06-08: qty 1500

## 2018-06-08 MED ORDER — LIDOCAINE 5 % EX PTCH
1.00 | MEDICATED_PATCH | CUTANEOUS | Status: DC
Start: 2018-06-08 — End: 2018-06-11
  Administered 2018-06-08 – 2018-06-10 (×4): 1 via TRANSDERMAL
  Filled 2018-06-08 (×4): qty 1

## 2018-06-08 MED ORDER — PROPOFOL INFUSION 10 MG/ML
INTRAVENOUS | Status: DC | PRN
Start: 2018-06-08 — End: 2018-06-08
  Administered 2018-06-08: 300 mg via INTRAVENOUS
  Administered 2018-06-08: 50 mg via INTRAVENOUS
  Administered 2018-06-08: 30 mg via INTRAVENOUS

## 2018-06-08 MED ORDER — ROCURONIUM BROMIDE 50 MG/5ML IV SOLN
INTRAVENOUS | Status: DC | PRN
Start: 2018-06-08 — End: 2018-06-08
  Administered 2018-06-08: 5 mg via INTRAVENOUS
  Administered 2018-06-08 (×2): 10 mg via INTRAVENOUS
  Administered 2018-06-08: 20 mg via INTRAVENOUS
  Administered 2018-06-08: 30 mg via INTRAVENOUS
  Administered 2018-06-08: 10 mg via INTRAVENOUS

## 2018-06-08 MED ORDER — ONDANSETRON HCL 4 MG/2ML IJ SOLN
INTRAMUSCULAR | Status: AC
Start: 2018-06-08 — End: ?
  Filled 2018-06-08: qty 2

## 2018-06-08 MED ORDER — PROPOFOL INFUSION 10 MG/ML
INTRAVENOUS | Status: DC | PRN
Start: 2018-06-08 — End: 2018-06-08
  Administered 2018-06-08: 75 ug/kg/min via INTRAVENOUS

## 2018-06-08 MED ORDER — CHLORHEXIDINE GLUCONATE 0.12 % MT SOLN
OROMUCOSAL | Status: AC
Start: 2018-06-08 — End: ?
  Filled 2018-06-08: qty 15

## 2018-06-08 MED ORDER — SUCCINYLCHOLINE CHLORIDE 20 MG/ML IJ SOLN
INTRAMUSCULAR | Status: DC | PRN
Start: 2018-06-08 — End: 2018-06-08
  Administered 2018-06-08: 120 mg via INTRAVENOUS

## 2018-06-08 MED ORDER — LACTATED RINGERS IV SOLN
INTRAVENOUS | Status: DC | PRN
Start: 2018-06-08 — End: 2018-06-08

## 2018-06-08 MED ORDER — GLYCOPYRROLATE 0.2 MG/ML IJ SOLN
INTRAMUSCULAR | Status: DC | PRN
Start: 2018-06-08 — End: 2018-06-08
  Administered 2018-06-08: .6 mg via INTRAVENOUS
  Administered 2018-06-08 (×2): 0.1 mg via INTRAVENOUS

## 2018-06-08 MED ORDER — NEOSTIGMINE METHYLSULFATE 1 MG/ML IJ/IV SOLN (WRAP)
Status: DC | PRN
Start: 2018-06-08 — End: 2018-06-08
  Administered 2018-06-08: 4 mg via INTRAVENOUS

## 2018-06-08 MED ORDER — ONDANSETRON HCL 4 MG/2ML IJ SOLN
4.0000 mg | Freq: Every day | INTRAMUSCULAR | Status: DC | PRN
Start: 2018-06-08 — End: 2018-06-11

## 2018-06-08 MED ORDER — SODIUM CHLORIDE 0.9 % IV MBP
4.5000 g | Freq: Four times a day (QID) | INTRAVENOUS | Status: AC
Start: 2018-06-08 — End: 2018-06-09
  Administered 2018-06-08 – 2018-06-09 (×4): 4.5 g via INTRAVENOUS
  Filled 2018-06-08 (×4): qty 20

## 2018-06-08 MED ORDER — HYDROMORPHONE HCL 0.5 MG/0.5 ML IJ SOLN
0.5000 mg | INTRAMUSCULAR | Status: DC | PRN
Start: 2018-06-08 — End: 2018-06-08

## 2018-06-08 MED ORDER — NEOSTIGMINE METHYLSULFATE 1 MG/ML IJ/IV SOLN (WRAP)
Status: AC
Start: 2018-06-08 — End: ?
  Filled 2018-06-08: qty 5

## 2018-06-08 MED ORDER — FAMOTIDINE 10 MG/ML IV SOLN (WRAP)
INTRAVENOUS | Status: DC | PRN
Start: 2018-06-08 — End: 2018-06-08
  Administered 2018-06-08: 20 mg via INTRAVENOUS

## 2018-06-08 MED ORDER — AMMONIA AROMATIC IN INHA
1.0000 | Freq: Once | RESPIRATORY_TRACT | Status: DC | PRN
Start: 2018-06-08 — End: 2018-06-08

## 2018-06-08 MED ORDER — KETOROLAC TROMETHAMINE 30 MG/ML IJ SOLN
INTRAMUSCULAR | Status: DC
Start: 2018-06-08 — End: 2018-06-08
  Filled 2018-06-08: qty 1

## 2018-06-08 MED ORDER — ALBUTEROL-IPRATROPIUM 2.5-0.5 (3) MG/3ML IN SOLN
3.00 mL | RESPIRATORY_TRACT | Status: DC
Start: 2018-06-08 — End: 2018-06-10
  Administered 2018-06-08 – 2018-06-09 (×6): 3 mL via RESPIRATORY_TRACT
  Filled 2018-06-08 (×6): qty 3

## 2018-06-08 MED ORDER — DOCUSATE SODIUM 100 MG PO CAPS
100.0000 mg | ORAL_CAPSULE | Freq: Two times a day (BID) | ORAL | Status: DC
Start: 2018-06-08 — End: 2018-06-11
  Filled 2018-06-08 (×3): qty 1

## 2018-06-08 MED ORDER — BISACODYL 10 MG RE SUPP
10.0000 mg | Freq: Every day | RECTAL | Status: DC | PRN
Start: 2018-06-08 — End: 2018-06-11

## 2018-06-08 SURGICAL SUPPLY — 71 items
ADHESIVE SKIN CLOSURE DERMABOND ADVANCED (Skin Closure) ×1
ADHESIVE SKIN CLOSURE DERMABOND ADVANCED .7 ML LIQUID APPLICATOR (Skin Closure) ×1 IMPLANT
ADHESIVE SKIN CLOSURE DERMABOND MINI .36 (Suture) ×1 IMPLANT
ADHESIVE SKIN CLOSURE DERMABOND MINI .36 ML LIQUID APPLICATOR (Suture) ×1 IMPLANT
ADHESIVE SKNCLS 2 OCTYL CYNCRLT .36ML MN (Suture) ×2
ADHESIVE SKNCLS 2 OCTYL CYNCRLT .7ML (Skin Closure) ×2
BLANKET WARMING L60 IN X W36 IN BAIR (Patient Supply) ×1 IMPLANT
BLANKET WRM PLMR BR HGR 60X36IN LF NS FT (Patient Supply) ×2
CATHETER DRN PVC HDRPH STRG TPR HDGLD XL (Catheter) ×2
CATHETER THORAX OD36 FR L23 IN STRAIGHT (Catheter) ×1 IMPLANT
CATHETER THORAX OD36 FR L23 IN STRAIGHT TAPER DRAINAGE HYDRAGLIDE XL (Catheter) ×1 IMPLANT
DISSECTOR SECTO OD3/8 IN SURGICAL (Sponge) ×6 IMPLANT
DISSECTOR SECTO OD3/8 IN SURGICAL PEANUT SPONGE (Sponge) ×6 IMPLANT
DISSECTOR SRG SCT 3/8IN PNUT SPNG (Sponge) ×12
DRAIN CHEST THORACIC TUBE DRY SUCTION (Drain) ×1 IMPLANT
DRAIN CHEST THORACIC TUBE DRY SUCTION COLLECTION CHAMBER ATRIUM OASIS (Drain) ×1 IMPLANT
DRAIN INCS PLS ATR OAS LF STRL TUBE DRY (Drain) ×2
DRAPE 66X44IN SLUSH WARMER PLATE EQUIPMENT ORS HUSH SLUSH STERILE (Drape) ×1 IMPLANT
DRAPE CARDIOVASCULAR SURGICAL SMS 84IN 38IN (Drape) ×1 IMPLANT
DRAPE EQP ORS HSLSH 66X44IN STRL SLSH (Drape) ×2 IMPLANT
DRAPE SRG SMS 84IN 38IN CVARTS 100X60IN (Drape) ×2 IMPLANT
GLOVE SRG PLISPRN 6.5 BGL PI ULTRATOUCH (Glove) ×2
GLOVE SRG PLISPRN 7.5 BGL PI ULTRATOUCH (Glove) ×2
GLOVE SURGICAL 6 1/2 BIOGEL PI (Glove) ×1 IMPLANT
GLOVE SURGICAL 6 1/2 BIOGEL PI ULTRATOUCH G POWDER FREE BEAD CUFF (Glove) ×1 IMPLANT
GLOVE SURGICAL 7 1/2 BIOGEL PI (Glove) ×1 IMPLANT
GLOVE SURGICAL 7 1/2 BIOGEL PI ULTRATOUCH G POWDER FREE ROUGH BEAD (Glove) ×1 IMPLANT
GOWN SRG LG SMARTGOWN LF STRL LVL 4 (Gown) ×2 IMPLANT
GOWN SRGCL LG LEVEL 4 BRTHBL STRL LF DSPSBL SMARTGOWN (Gown) ×1 IMPLANT
KIT TISS CLSR 4ML PROGEL LF STRL SLNT (Sealant) ×2 IMPLANT
KIT TISSUE CLOSURE SEALANT ADHESIVE AIR LEAK PROGEL 4 ML PLEURA (Sealant) ×1 IMPLANT
KIT TISSUE CLOSURE SEALANT ADHESIVE AR (Sealant) ×1 IMPLANT
PACK CVOR VATS THORACOSCPY (Pack) ×2 IMPLANT
PACK CVOR VATS THORACOSCPY SCV12CTHF4 (Pack) ×1 IMPLANT
PAD ARMBOARD FOAM 20X8X2IN (Positioning Supplies) ×4
PAD ARMBOARD L20 IN X W8 IN X H2 IN (Positioning Supplies) ×2 IMPLANT
PAD ARMBOARD L20 IN X W8 IN X H2 IN CONVOLUTE FOAM PURPLE (Positioning Supplies) ×2 IMPLANT
POSITIONER OR CONVOLUTE FOAM HEEL (Patient Supply) ×1 IMPLANT
POSITIONER OR FM LF CONVOLUTE HL (Patient Supply) ×2
POSITIONER OR FM LF HI RSLNT CSHN (Positioning Supplies) ×2
POSITIONER OR HIGH RESILIENT CUSHION (Positioning Supplies) ×1 IMPLANT
POSITIONER OR HIGH RESILIENT CUSHION MULTIRING FOAM HEAD RASPBERRY (Positioning Supplies) ×1 IMPLANT
SEALER/DIVIDER LAPAROSCOPIC L37 CM 350 D (Laparoscopy Supplies) IMPLANT
SEALER/DIVIDER LAPAROSCOPIC L37 CM 350 D L18.5 MM MARYLAND CURVE JAW (Laparoscopy Supplies) IMPLANT
SEALER/DIVIDER LAPSCP 350D 18.5MM LGSR (Laparoscopy Supplies)
SLEEVE CMPR MED KN LGTH KDL SCD 21- IN (Sleeve) ×2 IMPLANT
SLEEVE COMPRESSION MEDIUM KNEE LENGTH KENDALL SEQUENTIAL OD21- IN (Sleeve) ×1 IMPLANT
SPONGE TNSL CTTN 7/8IN STRL RADOPQ UNSTR (Sponge) ×10
SPONGE TONSIL OD7/8 IN RADIOPAQUE (Sponge) ×5 IMPLANT
SPONGE TONSIL OD7/8 IN RADIOPAQUE UNSTRUNG COTTON (Sponge) ×5 IMPLANT
STAPLER INTNL PVC STD UNV EGIA 12MM (Staplers) ×2
STAPLER TISSUE L16 CM X W4 MM OD12 MM (Staplers) ×1 IMPLANT
STAPLER TISSUE L16 CM X W4 MM OD12 MM ENDOSCOPIC ENDO GIA PVC INTERNAL (Staplers) ×1 IMPLANT
SUTURE ABS 2 TP-1 VCL 54IN BRD COAT UD (Suture) ×2
SUTURE ABS 3-0 RB1 MNCRL 27IN MFL UD (Suture) ×2
SUTURE COATED VICRYL 2 TP-1 L54 IN BRAID (Suture) ×1 IMPLANT
SUTURE MONOCRYL 3-0 RB-1 L27 IN (Suture) ×1 IMPLANT
SUTURE MONOCRYL 3-0 RB-1 L27 IN MONOFILAMENT UNDYED ABSORBABLE (Suture) ×1 IMPLANT
TAPE SRG CLTH DPRE 10YDX3IN LF HPOAL WTR (Tape) ×2
TAPE SURGICAL L10 YD X W3 IN (Tape) ×1
TAPE SURGICAL L10 YD X W3 IN HYPOALLERGENIC WATER RESISTANT (Tape) ×1 IMPLANT
TOWEL L26 IN X W17 IN COTTON PREWASH DELINT BLUE ACTISORB DELUXE (Procedure Accessories) ×2 IMPLANT
TOWEL SRG CTTN 26X17IN LF STRL PREWASH (Procedure Accessories) ×4
TROCAR LAPAROSCOPIC BLADELESS STABILITY SLEEVE L75 MM OD5 MM ENDOPATH (Laparoscopy Supplies) ×1 IMPLANT
TROCAR LAPAROSCOPIC STABILITY SLEEVE (Laparoscopy Supplies) ×1 IMPLANT
TROCAR LAPSCP EPTH XCL 5MM 75MM LF STRL (Laparoscopy Supplies) ×2
WATER STERILE PLASTIC POUR BOTTLE 1000 (Irrigation Solutions) ×1 IMPLANT
WATER STERILE PLASTIC POUR BOTTLE 1000 ML (Irrigation Solutions) ×1 IMPLANT
WATER STERILE PVC FREE DEHP FREE 1000 ML (Solution) ×1 IMPLANT
WATER STRL 1000ML LF PLS PR BTL (Irrigation Solutions) ×2
WATER STRL 1000ML PIC LF PVC FR DEHP-FR (Solution) ×2

## 2018-06-08 NOTE — Consults (Signed)
ID CONSULTATION      Spectra: [phone]    Office: [phone]    Date Time: 06/08/18 4:58 PM  Patient Name: Craig Phelps  Requesting Physician: Madilyn Hook, MD     Reason for Consultation:       Problem List:   Acute Problem List:   Returns for decortication and abscess resection 06/08/18   --Noted purulent drainage from chest tube and persistent air leak   --Significant pleural inflammation noted, multiple Cx sent  Presented with Pleural effusion, pneumothorax 04/2018  -- pigtail catheter placed  Lung abscess  -- sputum: mixed resp flora  -- pleural fluid: mod WBC, no growth, 10K WBC, pH- 7.87  Pleural/debris biopsy from 3/04 (updated report from 3/05)   -- many septate hyphae (fungal growth). Multiple Cx positive, Final ID Coccidiodes immitis   --Road trip [place]2017  Chronic Conditions:  H/O tobacco use(quit 10 days ago)  H/O vaping 6 months before  Seizure Disorder  GERD  Pilonidal Cyst  Assessment:   Pt is seen in the PACU post-op after a decortication L side.  Pt is well known to me from a previous hospitalization and office follow up.  He was admitted for subacute fevers and spontaneous pneumothorax caused by erosion of a pulmonary cavity into the pleural space.  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  Pt was initially treated with Voriconazole and Ertapenem for possible bacterial component and was subsequently switched to Fluconazole once the organism was identified.  He was noted to have a persistent air leak since discharge.  Drainage from the tube became purulent at the end of last week and the pt was brought in for another decortication and resection of the pulmonary abscess.  Possibilities include ongoing fungal infection with pt needing source control or a secondary bacterial infection.  Will tell by Cx since these were obtained without Abx.  Broad empiric coverage is indicated.       Antimicrobials:   #46 Antifungal therapy  #20 Fluconazole 400mg  PO Qday  #1 Vancomycin 1.25gm IV BID  #1 Zosyn 4.5 gm IV q 6    Recommendations:   Continue Fluconazole for coverage of known Coccidiodes infection  Rx empiric Vancomycin and Zosyn  Cx are pending  Surg path is pending  Discussed with pt and Dr. Dorothey Baseman  I have notified the lab and IP about the potential for Coccidiodes to grow again.    Thank you for this consult, will follow with you closely.        ________________________________________________________________________    Thank you for allowing me to participate in the care of this very interesting and pleasant patient    History:   Craig Phelps is a 23 y.o. male who presents to the hospital on 06/08/2018 for a scheduled decortication and resection of a L sided pulmonary abscess.  S/P recent hospitalization as outlined above.  Completed course of IV Abx and has remained on PO Fluconazole.  Has been feeling well, but purulent drainage from the chest tube was noted in his surgeon's office.  Significant inflammation noted in the pleural space, Cx are pending.  S/P complex resection of the abscess.  Pt was seen in the PACU and could not contribute significantly to the history.      Past Medical History:     Past Medical History:   Diagnosis Date    ADHD (attention deficit hyperactivity disorder)     took Adderall in highschool  and college but sopped    ENT disease     Pollen allergies/sneezing    Fractures     rt wrist x 2    GERD (gastroesophageal reflux disease)     occ/prilosec OTC    H1N1 influenza 2009    Pilonidal cyst 2014    Seizure        Past Surgical History:     Past Surgical History:   Procedure Laterality Date    CYSTECTOMY, PILONIDAL  08/24/2012    Procedure: CYSTECTOMY, PILONIDAL;  Surgeon: Despina Hidden, MD;  Location: [place] [facility];  Service: General;  Laterality: N/A;    THORACOSCOPY, (VATS), DECORTICATION Left 04/22/2018    Procedure: Augustine Radar, (VATS),  DECORTICATION;  Surgeon: Madilyn Hook, MD;  Location: [id number];  Service: Cardiothoracic;  Laterality: Left;  LEFT VATS DECORTICATION    WISDOM TOOTH EXTRACTION  2014       Family History:     Family History   Problem Relation Age of Onset    ADD / ADHD Father     ADD / ADHD Brother     Cancer Paternal Grandfather 47        bladder, smoker    Pulmonary fibrosis Maternal Grandmother         also with ITP    Alzheimer's disease Paternal Grandmother 46       Social History:     Social History     Socioeconomic History    Marital status: Single     Spouse name: Not on file    Number of children: 0    Years of education: Not on file    Highest education level: Not on file   Occupational History    Occupation: Archivist   Social Needs    Financial resource strain: Not on file    Food insecurity:     Worry: Not on file     Inability: Not on file    Transportation needs:     Medical: Not on file     Non-medical: Not on file   Tobacco Use    Smoking status: Former Smoker    Smokeless tobacco: Never Used    Tobacco comment: quit 1 week ago   Substance and Sexual Activity    Alcohol use: Not Currently     Alcohol/week: 0.0 standard drinks    Drug use: No    Sexual activity: Yes     Partners: Female     Comment: monogamous   Lifestyle                                                                                                       Social History Narrative    Taking a break from school    Wanting to transfer for the fall    Working at [employer]        Exercise: no     Diet: nothing special, tries to avoid GERD inducing foods       Allergies:     Allergies   Allergen Reactions  Other      Environmental allergies       Lines:   piv    Medications:     Current Facility-Administered Medications   Medication Dose Route Frequency    bupivacaine (PF)           Review of Systems:   General ROS: negative for - chills, fevers, night sweats, weight loss   HEENT: negative for - blurry  vision, sore throat, thrush   Respiratory ROS: negative for cough, SOB  Cardiovascular ROS: negative for - chest pain, palpitations   Gastrointestinal ROS: negative for - abdominal pain, nausea, vomiting, diarrhea  Genito-Urinary ROS: negative for - dysuria, urinary frequency/urgency   Musculoskeletal ROS: negative for - joint pain, joint stiffness or muscle pain   Dermatological ROS: negative for - rash and skin lesion changes   Neurological ROS: negative for - confusion, headache, dizziness  Hematological ROS: negative for - bruising, bleeding   Psychological ROS: negative for - changes in mood      Physical Exam:     Vitals:    06/08/18 1640   BP: 136/82   Pulse: 100   Resp: 16   Temp: 98.3   SpO2: 99%       General Appearance: alert and appropriate, non-toxic  Neuro: alert, oriented, normal speech, normal attention and cognition   HEENT: no scleral icterus, pupils round and reactive, OP clear, NCAT, EOMI  Neck: supple, no significant adenopathy   Lungs: clear to auscultation, no wheezes, rales or rhonchi, symmetric air entry.  Chest tube in place  Cardiac: normal rate, regular rhythm, normal S1, S2, No m/r/g  Abdomen: soft, non-tender, non-distended, normal active bowel sounds, no masses ororganomegaly  Extremities: no pedal edema, no c/c  Skin: no rash    Labs:     Lab Results   Component Value Date    WBC 20.95 (H) 06/08/2018    HGB 11.8 (L) 06/08/2018    HCT 35.5 (L) 06/08/2018    MCV 78.0 06/08/2018    PLT 662 (H) 06/08/2018     Lab Results   Component Value Date    CREAT 0.8 06/08/2018     Lab Results   Component Value Date    ALT 14 05/05/2018    AST 18 05/05/2018    ALKPHOS 114 (H) 05/05/2018    BILITOTAL 0.4 05/05/2018     No results found for: LACTATE    Microbiology:     Microbiology Results     Procedure Component Value Units Date/Time    AFB culture and smear [[id number]] Collected:  06/08/18 1416    Specimen:  Sputum from Tissue, Deep Updated:  06/08/18 1654    AFB culture and smear [[id number]]  Collected:  06/08/18 1130    Specimen:  Sputum from Tissue, Deep Updated:  06/08/18 1616    AFB culture and smear [[id number]] Collected:  06/08/18 1130    Specimen:  Sputum from Tissue, Deep Updated:  06/08/18 1616    AFB culture and smear [[id number]] Collected:  06/08/18 1050    Specimen:  Sputum from Tissue, Deep Updated:  06/08/18 1447    AFB culture and smear [[id number]] Collected:  06/08/18 1050    Specimen:  Sputum from Tissue, Deep Updated:  06/08/18 1445    Anaerobic culture [[id number]] Collected:  06/08/18 1416    Specimen:  Other from Lung Updated:  06/08/18 1654    Anaerobic culture [[id number]] Collected:  06/08/18 1130    Specimen:  Other from Tissue Updated:  06/08/18 1616    Anaerobic culture [[id number]] Collected:  06/08/18 1130    Specimen:  Other from Lung Updated:  06/08/18 1616    Anaerobic culture [[id number]] Collected:  06/08/18 1050    Specimen:  Other from Lung Updated:  06/08/18 1447    Anaerobic culture [[id number]] Collected:  06/08/18 1050    Specimen:  Other from Lung Updated:  06/08/18 1445    Fungus culture [[id number]] Collected:  06/08/18 1416    Specimen:  Other from Lung Updated:  06/08/18 1654    Fungus culture [[id number]] Collected:  06/08/18 1130    Specimen:  Other from Tissue Updated:  06/08/18 1616    Fungus culture [[id number]] Collected:  06/08/18 1130    Specimen:  Other from Lung Updated:  06/08/18 1616    Fungus culture [[id number]] Collected:  06/08/18 1050    Specimen:  Other from Lung Updated:  06/08/18 1447    Fungus culture [[id number]] Collected:  06/08/18 1050    Specimen:  Other from Lung Updated:  06/08/18 1445    Wound culture & gram stain [[id number]] Collected:  06/08/18 1416    Specimen:  Wound from Lesion Updated:  06/08/18 1654    Wound culture & gram stain [[id number]] Collected:  06/08/18 1130    Specimen:  Wound Updated:  06/08/18 1616    Wound culture & gram stain [[id number]] Collected:  06/08/18 1130    Specimen:  Wound Updated:  06/08/18 1616    Wound  culture & gram stain [[id number]] Collected:  06/08/18 1050    Specimen:  Wound Updated:  06/08/18 1447    Wound culture & gram stain [[id number]] Collected:  06/08/18 1050    Specimen:  Wound Updated:  06/08/18 1445          Rads:   No results found.    Signed by: Micheline Rough, MD

## 2018-06-08 NOTE — Op Note (Signed)
Procedure Date: 06/08/2018     Patient Type: I     SURGEON: Madilyn Hook MD  ASSISTANT:  Rosette Reveal PA     PREOPERATIVE DIAGNOSES:  Persistent air leak and recurrent empyema.     POSTOPERATIVE DIAGNOSES:  Persistent air leak and recurrent empyema.     TITLE OF PROCEDURE:  Left thoracoscopic complete decortication with upper lobe/lower lobe wedge  resection.     INDICATIONS FOR PROCEDURE:  Craig Phelps is a 23 year old gentleman who is status post left  thoracoscopic decortication for an empyema, persistent pneumothorax 6 weeks  ago.  Operative cultures eventually grew coccidioidomycosis and his  ertapenem antibiotics were discontinued 10 days prior and he was kept on  Diflucan.  He came to the office.  He has had a persistent air leak in the  office 3 days ago.  He is draining frank purulence.  Given these findings,  although the patient was doing quite well clinically, it was elected to  bring him to the operating room for operative washout, change of chest tube  and possible decortication, resection/repair of air leak as indicated.     ANESTHESIOLOGIST:  Leslie Dales MD, PhD and [facility].     ANESTHESIA:  Dual-lumen left-sided general endotracheal anesthesia.     ESTIMATED BLOOD LOSS:  10 mL.     COMPLICATIONS:  None.     FINDINGS:  Gross purulence with caviar-like whitish deposits throughout the entirety  of the visceral and parietal pleural surface and thickened visceral pleural  rind with dense adhesions to the mediastinum and posterior and inferior  pleural surfaces.  Previous lung abscess noted quite peripheral and with a  very large air leak and gross purulence within the abscess cavity.     SPECIMENS:  Left upper/lower lobe wedge resection for microbiologic and pathologic  evaluation.  Pleural debris visceral pleura and parietal pleura all for  microbiologic and pathologic evaluation.     DESCRIPTION OF PROCEDURE:  Craig Phelps was brought to the operating room, placed in supine  position,  underwent dual-lumen left-sided general endotracheal anesthesia by the  anesthesia team.  Single lung ventilation was instituted.  He was turned to  the right lateral decubitus position.  The existing left chest tube was  removed.  It was grossly purulent, although the incision had no cellulitis.   The anterior incision slightly separated anteriorly and the patient's  chest was scrubbed and then prepped and draped in sterile fashion.   Surgical pause was performed.  Cephalosporin-based antibiotics were  administered and discontinued postoperatively.     The posterior incision was approached and a 5 mm scope was placed within  the chest.  Upon entry, there was complete kinking of all visceral and  parietal pleural surfaces with a white cheesy granular exudate which on  closer inspection appeared like a tiny confluent caviar-like substance.   This was grossly debrided off the parietal pleura.  The area of lung  abscess was appreciated.  There was a significant defect present extending  down approximately 1 cm filled with purulence which was expressible.   Surrounding tissues were slightly thick but very pliable.  Complete  decortication was then ensued.  The area of adhesion laterally and medially  and apically and at the base was broken through and lung on the other side  was quite normal and was debrided away from the chest wall.  The whitish  exudative substance created significant thickening of the visceral pleura,  some of which was  through a visceral pleural rind especially inferiorly,  which was able to be debrided and some of it more superiorly along the  upper lobe was simply thickened visceral pleura.  Multiple attempts of the  debridement of the other potential arrived at this location subsequently  resulted in visceral pleural entry.  The lung was mobilized very tediously  from apex to base from medial pleural reflection to lateral pleural  reflection.  The area of thickened pleura was limited  to the lateral  exposed surface of the lung where the chest tube was, clearly delineated on  the CT scan.  The remaining portions and majority of the lung was entirely  normal.  Again, the lung was mobilized from apex to base from medial  pleural reflection to lateral pleural reflection.  Fissure was exposed.   The abscess cavity was at the level of the fissure and incorporating both  upper and lower lobe component seen on CT scan.  Once all debridement was  completed, a wedge resection was designed below the abscess cavity  encompassing both upper portions of the upper and lower lobes reassuring  the base of the cavity was excised and this was done with serial fires of  Endo-GIA tristapler black load, completing with purple load.  A minimal  amount of lung parenchyma was resected.  The staple line was intact.  Chest  irrigated with 3 liters of warm water, insufflation revealed only small  visceral pleural leaks treated with ProGel.  Hemostasis was confirmed.   Posteriorly based 36-French chest tube was placed towards the apex and lung  inflated under thoracoscopic visualization, noted to fill the space nicely  both incisions.  The anterior incision was debrided and the skin edges  freshened.  Both incisions were closed in standard fashion.  Subcutaneous  bupivacaine and bupivacaine was instilled.  The patient was turned to the  supine position, extubated in the operating room and taken to the post  anesthesia care unit in stable fashion.  There was a very small  intermittent grade I air leak at the conclusion of the case.  I have  apprised the patient's family of our intraoperative course, findings, and  immediate postoperative condition as well as notified Dr. Doreene Adas and  Dr. Rich Brave.           D:  06/08/2018 16:00 PM by Dr. Madilyn Hook, MD ([id number])  T:  06/08/2018 18:25 PM by NTS      Everlean Cherry: [id number]) (Doc ID: [id number])

## 2018-06-08 NOTE — Anesthesia Preprocedure Evaluation (Signed)
Anesthesia Evaluation    AIRWAY    Mallampati: II    TM distance: >3 FB  Neck ROM: full  Mouth Opening:full   CARDIOVASCULAR    cardiovascular exam normal, regular and normal       DENTAL    no notable dental hx     PULMONARY    pulmonary exam normal and clear to auscultation     OTHER FINDINGS                  Relevant Problems   NEURO/PSYCH   (+) Seizure      GI   (+) GERD (gastroesophageal reflux disease)               Anesthesia Plan    ASA 2     general                     intravenous induction   Detailed anesthesia plan: general endotracheal            informed consent obtained    Plan discussed with CRNA.    ECG reviewed  pertinent labs reviewed           ===============================================================  Inpatient Anesthesia Evaluation    Patient Name: Craig Phelps  Surgeon: Madilyn Hook, MD  Patient Age / Sex: 23 y.o. / male    Medical History:     Past Medical History:   Diagnosis Date    ADHD (attention deficit hyperactivity disorder)     took Adderall in highschool and college but sopped    ENT disease     Pollen allergies/sneezing    Fractures     rt wrist x 2    GERD (gastroesophageal reflux disease)     occ/prilosec OTC    H1N1 influenza 2009    Pilonidal cyst 2014    Seizure        Past Surgical History:   Procedure Laterality Date    CYSTECTOMY, PILONIDAL  08/24/2012    Procedure: CYSTECTOMY, PILONIDAL;  Surgeon: Despina Hidden, MD;  Location: [facility];  Service: General;  Laterality: N/A;    THORACOSCOPY, (VATS), DECORTICATION Left 04/22/2018    Procedure: Augustine Radar, (VATS), DECORTICATION;  Surgeon: Madilyn Hook, MD;  Location: [facility];  Service: Cardiothoracic;  Laterality: Left;  LEFT VATS DECORTICATION    WISDOM TOOTH EXTRACTION  2014         Allergies:     Allergies   Allergen Reactions    Other      Environmental allergies         Medications:     Current Facility-Administered Medications   Medication Dose Route Frequency Last  Rate Last Dose    bupivacaine (PF) (MARCAINE) 0.25 % injection            cefuroxime (ZINACEF) 1.5 g in sodium chloride 0.9 % 100 mL IVPB mini-bag plus  1.5 g Intravenous Once                  Prior to Admission medications    Medication Sig Start Date End Date Taking? Authorizing Provider   fluticasone (FLONASE) 50 MCG/ACT nasal spray 1 spray by Nasal route daily   Yes [provider]   acetaminophen (TYLENOL) 325 MG tablet Take 2 tablets (650 mg total) by mouth every 6 (six) hours 05/05/18   Hadesty, Julianne C, PA   albuterol (PROVENTIL HFA;VENTOLIN HFA) 108 (90 Base) MCG/ACT inhaler as needed 04/02/18  [provider]   benzonatate (TESSALON) 100 MG capsule as needed 04/02/18   [provider]   ertapenem 1,000 mg in sodium chloride 0.9 % 100 mL IVPB mini-bag plus Infuse 1,000 mg into the vein daily 05/06/18   Hadesty, Julianne C, PA     Vitals   Temp:  [37.7 C (99.9 F)] 37.7 C (99.9 F)  Heart Rate:  [113] 113  Resp Rate:  [18] 18  BP: (127)/(70) 127/70    Wt Readings from Last 3 Encounters:   06/08/18 70.5 kg (155 lb 6.8 oz)   04/21/18 69.4 kg (153 lb)   04/16/18 70.3 kg (155 lb)     BMI (Estimated body mass index is 21.07 kg/m as calculated from the following:    Height as of 04/16/18: 1.829 m (6' 0.01").    Weight as of this encounter: 70.5 kg (155 lb 6.8 oz).)  Temp Readings from Last 3 Encounters:   06/08/18 37.7 C (99.9 F) (Temporal)   05/05/18 36.5 C (97.7 F) (Oral)   04/14/18 37.3 C (99.1 F)     BP Readings from Last 3 Encounters:   06/08/18 127/70   05/05/18 129/69   04/14/18 145/86     Pulse Readings from Last 3 Encounters:   06/08/18 (!) 113   05/05/18 99   04/14/18 (!) 116           Labs:   CBC:  Lab Results   Component Value Date    WBC 16.36 (H) 05/05/2018    HGB 13.5 05/05/2018    HCT 40.9 05/05/2018    PLT 782 (H) 05/05/2018       Chemistries:  Lab Results   Component Value Date    NA 139 05/05/2018    K 4.5 05/05/2018    CL 104 05/05/2018    CO2 24 05/05/2018     BUN 13.0 05/05/2018    CREAT 0.8 05/05/2018    GLU 71 05/05/2018    CA 9.8 05/05/2018    AST 18 05/05/2018       Coags:  No results found for: PT, PTT, INR  _____________________      Signed by: Leslie Dales  06/08/18   9:53 AM    =============================================================      Signed by: Leslie Dales 06/08/18 9:52 AM

## 2018-06-08 NOTE — Transfer of Care (Signed)
Anesthesia Transfer of Care Note    Patient: Craig Phelps    Procedures performed: Procedure(s) with comments:  THORACOSCOPY, (VATS), DECORTICATION - LEFT VATS, DECORTICATION    Anesthesia type: General ETT    Patient location:PACU    Last vitals:   Vitals:    06/08/18 0937   BP: 127/70   Pulse: (!) 113   Resp: 18   Temp: 37.7 C (99.9 F)   SpO2: 99%       Post pain: Patient not complaining of pain, continue current therapy      Mental Status:awake and alert     Respiratory Function: tolerating face mask    Cardiovascular: stable    Nausea/Vomiting: patient not complaining of nausea or vomiting    Hydration Status: adequate    Post assessment: no apparent anesthetic complications, no reportable events and no evidence of recall    Signed by: Leslie Dales  06/08/18 3:57 PM

## 2018-06-08 NOTE — OR Nursing (Signed)
Virl Cagey, Nurse Practitioner, phoned patient Craig Phelps mother at 11:40 and updated her as to progress of case and condition of her son.

## 2018-06-08 NOTE — Progress Notes (Addendum)
Post-Op Check     Craig Phelps is a 23 y.o.  male s/p left thoracoscopic complete decortication with upper lobe/lower lobe wedge resection.    Subjective:   Pain currently 6/10.   Ambulating in the hallway, has walked > 10 laps.   Tolerating regular diet without any nausea or vomiting. Did not feel hungry for dinner.   No sob, chest pain.  Has not voided yet since the OR  Chest tube in place to suction.   Mag 2.1, repleted.       Objective:   No intake/output data recorded.     Vitals:    06/08/18 1802   BP: 152/87   Pulse: (!) 126   Resp: 18   Temp: 98.3 F (36.8 C)   SpO2: 99%       Gen: AOx3.   Cardio: RRR.   Pulm: Chest tube in place with serosanguinous output. + Air leak. Breath sounds clear bilaterally.   Abd: Soft. NT/ND.    Ext: BLE no edema    Assessment:   Craig Phelps is a 23 y.o.  male s/p left thoracoscopic complete decortication with upper lobe/lower lobe wedge resection.    Plan:   - Encourage OOB and Ambulation  - Chest tube to suction  - Regular diet  - IV Mag replacement ordered  - Pain control: Tylenol, PRN tramadol. Lidocaine patch added.   - Continue Vancomycin, Zosyn, Diflucan      ----------------------------------------------------------------------------------------------------------------------------  Addendum:  Called d/t increasing pain at chest tube insertion site. At that time, tachycardiac to 140s. Went to see patient and per patient, reports the pain improved with repositioning. Denies SOB. HR now 110. On exam, no erythema noted at chest tube site. Persistent air leak present. Breath sounds equal bilaterally. Lidocaine patch placed superior/anterior to chest tube.  Plan: Please position lidocaine patch between spine and chest tube - discussed with RN. Repositioning, heat, and ice packs for comfort. Give tylenol / tramadol as ordered. No indication for other narcotic analgesia. Chest tube to suction. Frequent ambulation.       Signed by: Audry Riles, MD

## 2018-06-08 NOTE — Progress Notes (Signed)
Pt ambulated to new room, 254. Pt is A&Ox4, VSS.     L CT to continuous suction, airleak present and Dr. Dorothey Baseman aware.     Upon arrival, RN educated pt on the need to walk this evening and pt refused. Dr. Dorothey Baseman notified on his cell, and is now at bedside speaking with patient.     Bed in lowest position, call bell and personal belongings within reach, and fall mat in place. Will continue to monitor patient and report any changes.

## 2018-06-09 ENCOUNTER — Other Ambulatory Visit: Payer: BLUE CROSS/BLUE SHIELD

## 2018-06-09 ENCOUNTER — Encounter: Payer: Self-pay | Admitting: Specialist

## 2018-06-09 ENCOUNTER — Inpatient Hospital Stay: Payer: BLUE CROSS/BLUE SHIELD

## 2018-06-09 LAB — CBC
Absolute NRBC: 0 10*3/uL (ref 0.00–0.00)
Hematocrit: 35.4 % — ABNORMAL LOW (ref 37.6–49.6)
Hgb: 11.6 g/dL — ABNORMAL LOW (ref 12.5–17.1)
MCH: 25.7 pg (ref 25.1–33.5)
MCHC: 32.8 g/dL (ref 31.5–35.8)
MCV: 78.5 fL (ref 78.0–96.0)
MPV: 9.4 fL (ref 8.9–12.5)
Nucleated RBC: 0 /100 WBC (ref 0.0–0.0)
Platelets: 657 10*3/uL — ABNORMAL HIGH (ref 142–346)
RBC: 4.51 10*6/uL (ref 4.20–5.90)
RDW: 13 % (ref 11–15)
WBC: 17.12 10*3/uL — ABNORMAL HIGH (ref 3.10–9.50)

## 2018-06-09 LAB — BASIC METABOLIC PANEL
BUN: 9 mg/dL (ref 9.0–28.0)
CO2: 20 mEq/L — ABNORMAL LOW (ref 22–29)
Calcium: 9.3 mg/dL (ref 8.5–10.5)
Chloride: 101 mEq/L (ref 100–111)
Creatinine: 0.9 mg/dL (ref 0.7–1.3)
Glucose: 135 mg/dL — ABNORMAL HIGH (ref 70–100)
Potassium: 4.8 mEq/L (ref 3.5–5.1)
Sodium: 136 mEq/L (ref 136–145)

## 2018-06-09 LAB — MAGNESIUM: Magnesium: 2.1 mg/dL (ref 1.6–2.6)

## 2018-06-09 LAB — GFR: EGFR: >60

## 2018-06-09 MED ORDER — SODIUM CHLORIDE 0.9 % IV MBP
4.5000 g | Freq: Four times a day (QID) | INTRAVENOUS | Status: DC
Start: 2018-06-09 — End: 2018-06-10
  Administered 2018-06-09 – 2018-06-10 (×3): 4.5 g via INTRAVENOUS
  Filled 2018-06-09 (×4): qty 20

## 2018-06-09 MED ORDER — MAGNESIUM SULFATE IN D5W 1-5 GM/100ML-% IV SOLN
1.00 g | Freq: Once | INTRAVENOUS | Status: AC
Start: 2018-06-09 — End: 2018-06-09
  Administered 2018-06-09: 09:00:00 1 g via INTRAVENOUS
  Filled 2018-06-09: qty 100

## 2018-06-09 MED ORDER — LIDOCAINE 5 % EX PTCH
1.00 | MEDICATED_PATCH | CUTANEOUS | Status: DC
Start: 2018-06-09 — End: 2018-06-11
  Administered 2018-06-09 – 2018-06-11 (×2): 1 via TRANSDERMAL
  Filled 2018-06-09 (×2): qty 1

## 2018-06-09 NOTE — Progress Notes (Addendum)
Thoracic Surgery Daily Progress Note  Team Spectra: [phone] / [phone]      Hospital Day: Hospital Day: 2  Primary Procedure: L VATS decortication, resection of abscess    Assessment:   Craig Phelps is a 23 y.o. 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 Doing well post-operatively    Plan:   - Chest tube to water seal  - Daily AM CXR  - Ambulation as much as possible today, goal is >20 laps, encourage IS  - Q4 nebs, pap therapy  - Replace Mg today  -  PRN tramadol for pain control  - Abx zosyn, vancomycin, diflucan, f/up cultures, appreciate ID recs  - Regular diet, bowel regimen      Interval History:   24 hour Events: ambulation for 10 laps last night. One episode of severe pain relieved with position change and tramadol    Subjective: Feels well, pain controlled, no dyspnea. Walking without issue. Voided.    Physical Exam:   Current Vitals:   BP 129/73    Pulse 97    Temp 98.5 F (36.9 C) (Oral)    Resp 20    Wt 68.1 kg (150 lb 3.2 oz)    SpO2 100%    BMI 20.37 kg/m       GEN: sitting up in chair, NAD   COR: RRR  LUNGS: B/L CTA anteriorly, good respiratory effort with IS to 3000  INCISION/WOUND: Clean, dry, and intact   ABD: soft, nttp, non-distended  EXTREMITIES: warm, no edema    Lines/Drains:   Chest Tube: of serosanguinous drainage since the OR. Primarily expiratory airleak, intermittent episodes of inspiratory leak.     Laboratory and Radiological Results:   Morning CXR reviewed.    Lab Results   Component Value Date    WBC 17.12 (H) 06/09/2018    HGB 11.6 (L) 06/09/2018    HCT 35.4 (L) 06/09/2018    MCV 78.5 06/09/2018    PLT 657 (H) 06/09/2018     Lab Results   Component Value Date    NA 134 (L) 06/08/2018    K 4.5 06/08/2018    CL 101 06/08/2018    CO2 19 (L) 06/08/2018    BUN 12.0 06/08/2018    CREAT 0.8 06/08/2018    MG 2.1 06/08/2018          X-ray Chest Ap Portable (once)    Result Date: 06/08/2018   Postsurgical changes of left chest with small pleural effusion and small pneumothorax. Chest tube in place Genelle Bal, MD 06/08/2018 5:17 PM      Montine Circle MD  General Surgery, PGY-3

## 2018-06-09 NOTE — Discharge Summary (Signed)
DISCHARGE NOTE  Thoracic Surgery  Team Spectra: [phone] / [phone]      Patient Name: Craig Phelps, Craig Phelps  DOB: 02-01-1996  MR#: [id number]  Attending Physician: Madilyn Hook, MD      Date of Admission:   06/08/2018    Date of Discharge:   06/11/18    Admission Diagnosis:   Pneumothorax, left [J93.9]  Pyothorax [J86.9]    Discharge Diagnosis:   Pneumothorax, left [J93.9]  Pyothorax [J86.9]    Operative Procedure:   Procedure(s):  THORACOSCOPY, (VATS), DECORTICATION,LEFT WEDGE RESECTION    Complications:   None to date    Consultations:   Infectious disease     Hospital Course:   Mr. Mayorquin was [facility] on 06/08/2018 and was taken to the OR that same day for surgery. The patient tolerated the procedure well and was brought to the PACU in stable condition. Once in the PACU the patient was ambulated multiple times and was transferred to the floor when PACU discharge criteria were met.    Mr. Murty ambulated freely in hallways during hospitalizaton. Pain well controlled with oral pain medication. Tolerating regular diet without nausea or vomiting.  The patient voiding well without difficulty.     Patient was seen and followed throughout hospitalization by infectious disease team. He had a midline placed on 4/22 for home antibiotic infusion.     The patient remained hemodynamically stable during hospitalization. Peak leukocytosis of 20.95 was trending down to 12.56 on day of discharge.      Chest tube with intermittent air leak post operatively which had resolved by POD #2.  Chest tube draining minimal serosanguinous drainage with no air leak on day of discharge. The chest tube was removed at bedside without difficulty.     Verbal and written discharge instructions given to patient and mother via telephone. All questions answered to patient and family's satisfaction.  Patient will follow up with infectious disease in 7-10 weeks, he will continue ertapenam for 5 days or until cultures finalize,  continue diflucan until stop time determined by infectious disease.     Follow up arranged for thoracic surgery. Patient instructed by infectious disease to call to make an appointment for 7-10 days.     Discharge Medications:     Current Discharge Medication List      START taking these medications    Details   fluconazole (DIFLUCAN) 200 MG tablet Take 2 tablets (400 mg total) by mouth every 24 hours  Qty: 60 tablet, Refills: 0      traMADol (ULTRAM) 50 MG tablet Take 1 tablet (50 mg total) by mouth every 6 (six) hours as needed for Pain  Qty: 10 tablet, Refills: 0         CONTINUE these medications which have CHANGED    Details   ertapenem 1,000 mg in sodium chloride 0.9 % 100 mL IVPB mini-bag plus Infuse 1,000 mg into the vein every 24 hours for 4 days  Qty: 4 Bag, Refills: 0         CONTINUE these medications which have NOT CHANGED    Details   fluticasone (FLONASE) 50 MCG/ACT nasal spray 1 spray by Nasal route daily      acetaminophen (TYLENOL) 325 MG tablet Take 2 tablets (650 mg total) by mouth every 6 (six) hours  Qty: 10 tablet, Refills: 0      albuterol (PROVENTIL HFA;VENTOLIN HFA) 108 (90 Base) MCG/ACT inhaler as needed         STOP  taking these medications       benzonatate (TESSALON) 100 MG capsule              Disposition:   Discharged to home in good condition    Follow up:   Madilyn Hook, MD  [address]  [place] [address]  [phone]    Follow up on 06/19/2018  @ 1 pm with pre-clinic Chest x-ray    Marga Hoots, MD  [address]  [place] [address]  [phone]    Schedule an appointment as soon as possible for a visit in 1 week(s)  Please call to make an appointment for 7-10 days        Signed by: Elvina Sidle, PA-C  Thoracic Surgery

## 2018-06-09 NOTE — Progress Notes (Addendum)
Dr. Dorothey Baseman called for updates on patient and order given to place chest tube to water-seal. Chest tube placed on water-seal at this time.

## 2018-06-09 NOTE — Plan of Care (Addendum)
Problem: Safety  Goal: Patient will be free from injury during hospitalization  Outcome: Progressing  Goal: Patient will be free from infection during hospitalization  Outcome: Progressing     Problem: Pain  Goal: Pain at adequate level as identified by patient  Outcome: Progressing     Problem: Side Effects from Pain Analgesia  Goal: Patient will experience minimal side effects of analgesic therapy  Outcome: Progressing     Problem: Discharge Barriers  Goal: Patient will be discharged home or other facility with appropriate resources  Outcome: Progressing     Problem: Psychosocial and Spiritual Needs  Goal: Demonstrates ability to cope with hospitalization/illness  Outcome: Progressing       Comments: Shift Note   ?   Neuro/Pain: A&Ox4, pain well-controlled on current regimen  Cardiac: sinus tachy 90-110s, SBP 110s  Resp: 99% on RA, denies SOB and dizziness   GI/GU: LBM 4/19  Incisions: L chest incision CDI, dermabond, OTA  Lines/Drips: 1 PIV  Drains: 1 CT  Assist: independent    Plan: ?   - ambulate  - continue antibiotic & antifungal medications    Fall safety reviewed with pt; verbalizes and demonstrates good understanding. Fall mat and SCDs in place. Call bell, personal belongings, and room phone within pt's reach; will continue to monitor with hourly rounds.

## 2018-06-09 NOTE — Progress Notes (Signed)
ID PROGRESS NOTE      Spectra: [phone]    Office: [phone]    Date Time: 06/09/18 @NOW   Patient Name: Craig Phelps, Craig Phelps    Problem List:    Acute Problem List:   Returns for decortication and abscess resection 06/08/18            --Noted purulent drainage from chest tube and persistent air leak            --Significant pleural inflammation noted, multiple Cx sent  Presented with Pleural effusion, pneumothorax 04/2018  -- pigtail catheter placed  Lung abscess  -- sputum: mixed resp flora  -- pleural fluid: mod WBC, no growth, 10K WBC, pH- 7.87  Pleural/debris biopsy from 3/04 (updated report from 3/05)   -- many septate hyphae (fungal growth). Multiple Cx positive, Final ID Coccidiodes immitis            --[place]    Chronic Conditions:  H/O tobacco use(quit 10 days ago)  H/O vaping 6 months before  Seizure Disorder  GERD  Pilonidal Cyst    Assessment:   S/p decortication L side with an upper lobe/lower lob wedge resection on 4/20.  Pt is well known to our practicfrom a previous hospitalization and office follow up.  He was admitted for subacute fevers and spontaneous pneumothorax caused by erosion of a pulmonary cavity into the pleural space.  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  Pt was initially treated with voriconazole and ertapenem for possible bacterial component and was subsequently switched to Fluconazole once the organism was identified.  He was noted to have a persistent air leak since discharge.  Drainage from the tube became purulent at the end of last week and the pt was brought in for another decortication and resection of the pulmonary abscess.  Possibilities include ongoing fungal infection with pt needing source control or a secondary bacterial infection.  Will tell by Cx since these were obtained without Abx.       No fevers.  Doing well after surgery on 4/20.  On room  air.  WBC 17K, Cr 0.9.  Multiple stains from yesterday's surgery reviewed, no organisms (yeast or bacteria) but will need to follow up the cultures.  Ruptured cavities in cocci are a known entity and typically occur in young, healthy men and are not necessarily suggestive of immunocompromise.  We will check HIV and hemoglobin A1C to be complete.  Immunoglobulins were normal in 2017.    Antimicrobials:   #47 Antifungal therapy  #21 Fluconazole 400mg  PO Qday  #2 Vancomycin 1.25gm IV BID (stop)  #2 Zosyn 4.5 gm IV q 6    Plan:   1.  Continue fluconazole for coverage of known Coccidiodes infection.  2.  Stop vancomycin.  3.  Continue pip-tazo for now.    4.  Follow up the cultures and path.  5.  Check HIV (patient agreeable) and Hgb A1C.    Dr. Lorel Monaco already notified the lab and IP about the potential for Coccidiodes to grow again.     Discussed with the patient and the thoracic surgery team.    Lines:   PIV    Family History:     Family History   Problem Relation Age of Onset    ADD / ADHD Father     ADD / ADHD Brother     Cancer Paternal Grandfather 98        bladder, smoker  Pulmonary fibrosis Maternal Grandmother         also with ITP    Alzheimer's disease Paternal Grandmother 34       Social History:     Social History     Socioeconomic History    Marital status: Single     Spouse name: Not on file    Number of children: 0    Years of education: Not on file    Highest education level: Not on file   Occupational History    Occupation: Archivist   Social Needs    Financial resource strain: Not on file    Food insecurity:     Worry: Not on file     Inability: Not on file    Transportation needs:     Medical: Not on file     Non-medical: Not on file   Tobacco Use    Smoking status: Former Smoker    Smokeless tobacco: Never Used    Tobacco comment: quit 1 week ago   Substance and Sexual Activity    Alcohol use: Not Currently     Alcohol/week: 0.0 standard drinks    Drug use: No    Sexual  activity: Yes     Partners: Female     Comment: monogamous   Lifestyle    Physical activity:     Days per week: Not on file     Minutes per session: Not on file    Stress: Not on file   Relationships    Social connections:     Talks on phone: Not on file     Gets together: Not on file     Attends religious service: Not on file     Active member of club or organization: Not on file     Attends meetings of clubs or organizations: Not on file     Relationship status: Not on file    Intimate partner violence:     Fear of current or ex partner: Not on file     Emotionally abused: Not on file     Physically abused: Not on file     Forced sexual activity: Not on file   Other Topics Concern    Not on file   Social History Narrative    Taking a break from school    Wanting to transfer for the fall    Working at [employer]        Exercise: no     Diet: nothing special, tries to avoid GERD inducing foods       Allergies:     Allergies   Allergen Reactions    Other      Environmental allergies       Review of Systems:   No fevers  Surgery yesterday  Walking laps    Physical Exam:     Vitals:    06/09/18 1350   BP:    Pulse: (!) 101   Resp:    Temp:    SpO2:      Bp112/64, rr 18, temp 98.46F, sat 99%    General Appearance: non-toxic  Neuro: awake, alert, grossly non-focal  Lungs: decreased BS, no wheezing  Cardiac: regular  Abdomen: soft, benign  Extremities: scds in place b/l    Labs:     Lab Results   Component Value Date    WBC 17.12 (H) 06/09/2018    HGB 11.6 (L) 06/09/2018    HCT 35.4 (L) 06/09/2018  MCV 78.5 06/09/2018    PLT 657 (H) 06/09/2018     Lab Results   Component Value Date    CREAT 0.9 06/09/2018     Lab Results   Component Value Date    ALT 14 05/05/2018    AST 18 05/05/2018    ALKPHOS 114 (H) 05/05/2018    BILITOTAL 0.4 05/05/2018     No results found for: LACTATE    Microbiology:     Microbiology Results     Procedure Component Value Units Date/Time    AFB culture and smear [[id number]] Collected:   06/08/18 1130    Specimen:  Sputum from Tissue, Deep Updated:  06/09/18 1107    Narrative:       ORDER#: [id number]                                    ORDERED BY: Dorothey Baseman, SANDE  SOURCE: Tissue, Deep Visceral Pleural Rind           COLLECTED:  06/08/18 11:30  ANTIBIOTICS AT COLL.:                                RECEIVED :  06/08/18 16:16  Stain, Acid Fast                           FINAL       06/09/18 11:07  06/09/18   No Acid Fast Bacillus Seen  Culture Acid Fast Bacillus (AFB)           PENDING      AFB culture and smear [[id number]] Collected:  06/08/18 1130    Specimen:  Sputum from Tissue, Deep Updated:  06/09/18 1107    Narrative:       ORDER#: [id number]                                    ORDERED BY: Dorothey Baseman, SANDE  SOURCE: Tissue, Deep Parietal Pleural Rind           COLLECTED:  06/08/18 11:30  ANTIBIOTICS AT COLL.:                                RECEIVED :  06/08/18 16:16  Stain, Acid Fast                           FINAL       06/09/18 11:07  06/09/18   No Acid Fast Bacillus Seen  Culture Acid Fast Bacillus (AFB)           PENDING      AFB culture and smear [[id number]] Collected:  06/08/18 1416    Specimen:  Sputum from Tissue, Deep Updated:  06/09/18 1107    Narrative:       ORDER#: [id number]                                    ORDERED BY: Dorothey Baseman, SANDE  SOURCE: Tissue, Deep upper/lower lobe wedge          COLLECTED:  06/08/18 14:16  ANTIBIOTICS AT COLL.:  RECEIVED :  06/08/18 19:08  Stain, Acid Fast                           FINAL       06/09/18 11:07  06/09/18   No Acid Fast Bacillus Seen  Culture Acid Fast Bacillus (AFB)           PENDING      AFB culture and smear [[id number]] Collected:  06/08/18 1050    Specimen:  Sputum from Tissue, Deep Updated:  06/08/18 2215    Narrative:       ORDER#: [id number]                                    ORDERED BY: Dorothey Baseman, SANDE  SOURCE: Tissue, Deep Pleural Biopsy                  COLLECTED:  06/08/18 10:50  ANTIBIOTICS AT COLL.:                                 RECEIVED :  06/08/18 14:47  Stain, Acid Fast                           FINAL       06/08/18 22:15  06/08/18   No Acid Fast Bacillus Seen  Culture Acid Fast Bacillus (AFB)           PENDING      AFB culture and smear [[id number]] Collected:  06/08/18 1050    Specimen:  Sputum from Tissue, Deep Updated:  06/08/18 2215    Narrative:       ORDER#: [id number]                                    ORDERED BY: Dorothey Baseman, SANDE  SOURCE: Tissue, Deep Pleural Biopsy #2               COLLECTED:  06/08/18 10:50  ANTIBIOTICS AT COLL.:                                RECEIVED :  06/08/18 14:45  Stain, Acid Fast                           FINAL       06/08/18 22:15  06/08/18   No Acid Fast Bacillus Seen  Culture Acid Fast Bacillus (AFB)           PENDING      Anaerobic culture [[id number]] Collected:  06/08/18 1416    Specimen:  Other from Lung Updated:  06/08/18 1908    Anaerobic culture [[id number]] Collected:  06/08/18 1130    Specimen:  Other from Tissue Updated:  06/08/18 1616    Anaerobic culture [[id number]] Collected:  06/08/18 1130    Specimen:  Other from Lung Updated:  06/08/18 1616    Anaerobic culture [[id number]] Collected:  06/08/18 1050    Specimen:  Other from Lung Updated:  06/08/18 1447    Anaerobic culture [[id number]] Collected:  06/08/18 1050    Specimen:  Other from Lung Updated:  06/08/18  1445    Fungus culture [[id number]] Collected:  06/08/18 1130    Specimen:  Other from Lung Updated:  06/09/18 1109    Narrative:       ORDER#: [id number]                                    ORDERED BY: Dorothey Baseman, SANDE  SOURCE: Lung Parietal Pleural Rind                   COLLECTED:  06/08/18 11:30  ANTIBIOTICS AT COLL.:                                RECEIVED :  06/08/18 16:16  Stain, Fungal                              FINAL       06/09/18 11:08  06/09/18   No Fungal or Yeast Elements Seen  Culture Fungus                             PENDING      Fungus culture [[id number]] Collected:  06/08/18 1416    Specimen:   Other from Lung Updated:  06/09/18 1109    Narrative:       ORDER#: [id number]                                    ORDERED BY: Dorothey Baseman, SANDE  SOURCE: Lung upper/lower lobe wedge                  COLLECTED:  06/08/18 14:16  ANTIBIOTICS AT COLL.:                                RECEIVED :  06/08/18 19:08  Stain, Fungal                              FINAL       06/09/18 11:08  06/09/18   No Fungal or Yeast Elements Seen  Culture Fungus                             PENDING      Fungus culture [[id number]] Collected:  06/08/18 1050    Specimen:  Other from Lung Updated:  06/09/18 1109    Narrative:       ORDER#: [id number]                                    ORDERED BY: Dorothey Baseman, SANDE  SOURCE: Lung Pleural Biopsy #2                       COLLECTED:  06/08/18 10:50  ANTIBIOTICS AT COLL.:                                RECEIVED :  06/08/18 14:45  Stain,  Fungal                              FINAL       06/09/18 11:08  06/09/18   No Fungal or Yeast Elements Seen  Culture Fungus                             PENDING      Fungus culture [[id number]] Collected:  06/08/18 1130    Specimen:  Other from Tissue Updated:  06/09/18 1109    Narrative:       ORDER#: [id number]                                    ORDERED BY: Dorothey Baseman, SANDE  SOURCE: Tissue Visceral Pleural Rind                 COLLECTED:  06/08/18 11:30  ANTIBIOTICS AT COLL.:                                RECEIVED :  06/08/18 16:16  Stain, Fungal                              FINAL       06/09/18 11:08  06/09/18   No Fungal or Yeast Elements Seen  Culture Fungus                             PENDING      Fungus culture [[id number]] Collected:  06/08/18 1050    Specimen:  Other from Lung Updated:  06/09/18 1109    Narrative:       ORDER#: [id number]                                    ORDERED BY: Dorothey Baseman, SANDE  SOURCE: Lung Pleural Biopsy                          COLLECTED:  06/08/18 10:50  ANTIBIOTICS AT COLL.:                                RECEIVED :  06/08/18 14:47  Stain, Fungal                               FINAL       06/09/18 11:08  06/09/18   No Fungal or Yeast Elements Seen  Culture Fungus                             PENDING      Wound culture & gram stain [[id number]] Collected:  06/08/18 1416    Specimen:  Wound from Lesion Updated:  06/08/18 2026    Narrative:       ORDER#: [id number]  ORDERED BY: KHANDHAR, SANDE  SOURCE: Lesion upper/lower lobe wedge                COLLECTED:  06/08/18 14:16  ANTIBIOTICS AT COLL.:                                RECEIVED :  06/08/18 19:08  Stain, Gram                                FINAL       06/08/18 20:26  06/08/18   Moderate WBCs             No Squamous epithelial cells seen             No organisms seen  Culture and Gram Stain, Aerobic, Wound     PENDING      Wound culture & gram stain [[id number]] Collected:  06/08/18 1130    Specimen:  Wound Updated:  06/08/18 2012    Narrative:       ORDER#: [id number]                                    ORDERED BY: Dorothey Baseman, SANDE  SOURCE: Wound Parietal Pleural Rind                  COLLECTED:  06/08/18 11:30  ANTIBIOTICS AT COLL.:                                RECEIVED :  06/08/18 16:16  Stain, Gram                                FINAL       06/08/18 20:12  06/08/18   Few WBCs             No Squamous epithelial cells seen             No organisms seen  Culture and Gram Stain, Aerobic, Wound     PENDING      Wound culture & gram stain [[id number]] Collected:  06/08/18 1130    Specimen:  Wound Updated:  06/08/18 2011    Narrative:       ORDER#: [id number]                                    ORDERED BY: Dorothey Baseman, SANDE  SOURCE: Wound Visceral Pleural Rind                  COLLECTED:  06/08/18 11:30  ANTIBIOTICS AT COLL.:                                RECEIVED :  06/08/18 16:16  Stain, Gram                                FINAL       06/08/18 20:11  06/08/18   Few WBCs  No Squamous epithelial cells seen             No organisms seen  Culture and Gram Stain, Aerobic, Wound      PENDING      Wound culture & gram stain [[id number]] Collected:  06/08/18 1050    Specimen:  Wound Updated:  06/08/18 2007    Narrative:       ORDER#: [id number]                                    ORDERED BY: Dorothey Baseman, SANDE  SOURCE: Wound Pleural Biopsy                         COLLECTED:  06/08/18 10:50  ANTIBIOTICS AT COLL.:                                RECEIVED :  06/08/18 14:47  Stain, Gram                                FINAL       06/08/18 20:07  06/08/18   Few WBCs             No Squamous epithelial cells seen             No organisms seen  Culture and Gram Stain, Aerobic, Wound     PENDING      Wound culture & gram stain [[id number]] Collected:  06/08/18 1050    Specimen:  Wound Updated:  06/08/18 2007    Narrative:       ORDER#: [id number]                                    ORDERED BY: Dorothey Baseman, SANDE  SOURCE: Wound Pleural Biopsy #2                      COLLECTED:  06/08/18 10:50  ANTIBIOTICS AT COLL.:                                RECEIVED :  06/08/18 14:45  Stain, Gram                                FINAL       06/08/18 20:07  06/08/18   Many WBCs             No Squamous epithelial cells seen             No organisms seen  Culture and Gram Stain, Aerobic, Wound     PENDING            Rads:   Xr Chest Ap Portable    Result Date: 06/09/2018   Stable postop change noted in the chest. Geanie Cooley, MD 06/09/2018 8:39 AM    X-ray Chest Ap Portable (once)    Result Date: 06/08/2018   Postsurgical changes of left chest with small pleural effusion and small pneumothorax. Chest tube in place Genelle Bal, MD 06/08/2018 5:17 PM      Signed  by: Marga Hoots, MD    *I spent over 35 minutes managing this complex case, including reviewing the chart, labs, micro, radiology (both recent CTs viewed), interviewing and examining the patient, and discussing with the patient, Dr. Lorel Monaco, and the thoracic surgery team.

## 2018-06-09 NOTE — Progress Notes (Signed)
Case Management Initial Assessment and Discharge Planning:  Situation Patient: Craig Phelps, Craig Phelps , 23 y.o., male  Admission DX: Pneumothorax, left [J93.9]  Pyothorax [J86.9]  IP days: 1  Lace Score:   Background This CM has met with pt.  by the bedside for the initial assessment.    Pt is A & O x4.    Pt. lives with his parents.    Demographics confirmed.     Pts designee (Mother): [place], [place]: [phone].    Pt. is self-care & ambulatory prior to admission.    DME: None     Assessment   Pt has Insurance.    Pt has family support.    Transportation: Mother.          Recommendation   CM will continue to follow up for safe [place]              06/09/18 1649   Patient Type   Within 30 Days of Previous Admission? No   Healthcare Decisions   Interviewed: Patient   [place] Information: [phone]   Orientation/Decision Making Abilities of Patient Alert and Oriented x3, able to make decisions   Prior to admission   Prior level of function Independent with ADLs   Type of Residence Private residence  (Lives with his parents )   Home Layout Multi-level   Have running water, electricity, heat, etc? Yes   Living Arrangements Parent   How do you get to your MD appointments? Independent    How do you get your groceries? Independent   Who fixes your meals? Independent   Who does your laundry? Independent   Who picks up your prescriptions? Independent   Dressing Independent   Grooming Independent   Feeding Independent   Bathing Independent   Toileting Independent   DME Currently at Home   (None)   Adult Protective Services (APS) involved? No   Discharge Planning   Support Systems Parent   Patient expects to be discharged to: Home with [facility]   Anticipated [place] plan discussed with: Same as interviewed   [place] discussion contact information: Patient   Potential barriers to discharge:   (None)   Mode of transportation: Private car (family member)  (Mother)   Does the patient have perscription coverage? Yes    Consults/Providers   PT Evaluation Needed 2   OT Evalulation Needed 2   Correct PCP listed in Epic? Yes   PCP   PCP on file was verified as the current PCP? Yes   Important Message from Medicare Notice   Patient received 1st IMM Letter? n/a   Adora Fridge  RN Case Manager  [facility]  Case Management Department  [phone]

## 2018-06-09 NOTE — Anesthesia Postprocedure Evaluation (Signed)
Anesthesia Post Evaluation    Patient: Craig Phelps    Procedure(s) with comments:  THORACOSCOPY, (VATS), DECORTICATION,LEFT WEDGE RESECTION - LEFT VATS, DECORTICATION    Anesthesia type: general    Last Vitals:   Vitals Value Taken Time   BP 141/82 06/08/2018  5:20 PM   Temp 36.7 C (98 F) 06/08/2018  5:10 PM   Pulse 97 06/08/2018  5:20 PM   Resp 18 06/08/2018  5:20 PM   SpO2 99 % 06/08/2018  5:20 PM       Anesthesia Post Evaluation:     Patient Evaluated: PACU    Level of Consciousness: awake and alert    Pain Management: adequate    Airway Patency: patent    Anesthetic complications: No      PONV Status: none    Cardiovascular status: acceptable  Respiratory status: acceptable  Hydration status: acceptable        Anesthesia Qualified Clinical Data Registry 2018    PACU Reintubation  Did the Patient have general anesthesia with intubation: Yes  Did the Patient require reintubation in the PACU?: No  Was this a planned exubation trial (documented in the medical record)?: No    PONV Adult  Is the patient aged 10 or older: Yes  Did the patient receive recieve a general anesthestic: Yes  Does the patient have 3 or more risk factors for PONV? No        PONV Pediatric  Is the patient aged 56-17? No            PACU Transfer Checklist Protocol  Was the patient transferred to the PACU at the conclusion of surgery? Yes  Was a checklist or transfer protocol used? Yes    ICU Transfer Checklist Protocol  Was the patient transferred to the ICU at the conclusion of surgery? No      Post-op Pain Assessment Prior to Anesthesia Care End  Age >=18 and assessed for pain in PACU: Yes  Pacu pain score <7/10: Yes      Perioperative Mortality  Perioperative mortality prior to Anesthesia end time: No    Perioperative Cardiac Arrest  Did the patient have an unanticipated intraoperative cardiac arrest between anesthesia start time and anesthesia end time? No    Unplanned Admission to ICU  Did the patient have an unplanned admission  to the ICU (not initially anticipated at anesthesia start time)? No      Signed by: Leslie Dales, 06/09/2018 9:29 AM

## 2018-06-09 NOTE — UM Notes (Addendum)
06/08/18 0981  Admit to Inpatient Once         A 23 year old gentleman who is status post left thoracoscopic decortication for an empyema, persistent pneumothorax 6 weeks ago. Operative cultures eventually grew coccidioidomycosis and his ertapenem antibiotics were discontinued 10 days prior and he was kept on Diflucan.  He came to the office.  He has had a persistent air leak in the office 3 days ago. He is draining frank purulence. Given these findings, although the patient was doing quite well clinically, it was elected to bring him to the operating room for operative washout, change of chest tube and possible decortication, resection/repair of air leak as indicated.     Procedure Date: 06/08/2018    TITLE OF PROCEDURE:  Left thoracoscopic complete decortication with upper lobe/lower lobe wedge  resection.    PREOPERATIVE AND POSTOPERATIVE DIAGNOSES:  Persistent air leak and recurrent empyema.     VS--BP 152/87  P 126  R 18  T 98.3  SpO2 99%    CXR-- Postsurgical changes of left chest with small pleural effusion and small pneumothorax. Chest tube in place    LABS--WBC 20.95  H/H 11.8 / 35.5  PLT 662 Glu 144  NA 134  CO2 19     Gen: AOx3.   Cardio: RRR.   Pulm: Chest tube in place with serosanguinous output. + Air leak. Breath sounds clear bilaterally.   Abd: Soft. NT/ND.    Ext: BLE no edema    SUBJECTIVE--Pain currently 6/10.   Ambulating in the hallway, has walked > 10 laps. Tolerating regular diet without any nausea or vomiting. Did not feel hungry for dinner. No sob, chest pain. Has not voided yet since the OR.     PLAN:  - Encourage OOB and Ambulation  - Chest tube to suction  - Regular diet  - IV Mag replacement ordered  - Pain control: Tylenol, PRN tramadol. Lidocaine patch added.   - Continue Vancomycin, Zosyn, Diflucan    ADDENDUM PER SURGERY:  Called d/t increasing pain at chest tube insertion site. At that time, tachycardiac to 140s. Went to see patient and per patient, reports the pain improved with  repositioning. Denies SOB. HR now 110. On exam, no erythema noted at chest tube site. Persistent air leak present. Breath sounds equal bilaterally. Lidocaine patch placed superior/anterior to chest tube.  Plan: Please position lidocaine patch between spine and chest tube - discussed with RN. Repositioning, heat, and ice packs for comfort. Give tylenol / tramadol as ordered. No indication for other narcotic analgesia. Chest tube to suction. Frequent ambulation.     Aislee Landgren P. [facility] BSN  Utilization Review  [employer]  [phone]

## 2018-06-09 NOTE — Plan of Care (Signed)
Problem: Safety  Goal: Patient will be free from injury during hospitalization  Outcome: Progressing  Goal: Patient will be free from infection during hospitalization  Outcome: Progressing     Problem: Pain  Goal: Pain at adequate level as identified by patient  Outcome: Progressing     Problem: Side Effects from Pain Analgesia  Goal: Patient will experience minimal side effects of analgesic therapy  Outcome: Progressing     Problem: Discharge Barriers  Goal: Patient will be discharged home or other facility with appropriate resources  Outcome: Progressing     Problem: Psychosocial and Spiritual Needs  Goal: Demonstrates ability to cope with hospitalization/illness  Outcome: Progressing     Patient alert and oriented times four, vital signs stable, tele sinus tach heart rate in the 110-120's. Patient's left lateral chest tube to suction has a persistent air leak, and is draining moderate amounts of serosanguinous drainage. Ambulated  independently in the hallway and encouraged to do breathing exercises.  Around midnight, patient complained of severe sharp pain at the chest tube insertion site, stating that pain level was a 10, on the pain scale. He looked very uncomfortable and had a heart rate between 130-140's. Was medicated with tramadol with no relief. Surgery resident was informed and arrived to assess patient. New orders were written for lidoderm patch.  Patient positioned in the cardiac chair for comfort. Will continue to monitor by hourly rounding, call bell placed within reach.

## 2018-06-10 ENCOUNTER — Inpatient Hospital Stay: Payer: BLUE CROSS/BLUE SHIELD

## 2018-06-10 LAB — GFR: EGFR: >60

## 2018-06-10 LAB — CBC
Absolute NRBC: 0 10*3/uL (ref 0.00–0.00)
Hematocrit: 32 % — ABNORMAL LOW (ref 37.6–49.6)
Hgb: 10 g/dL — ABNORMAL LOW (ref 12.5–17.1)
MCH: 25.2 pg (ref 25.1–33.5)
MCHC: 31.3 g/dL — ABNORMAL LOW (ref 31.5–35.8)
MCV: 80.6 fL (ref 78.0–96.0)
MPV: 9.4 fL (ref 8.9–12.5)
Nucleated RBC: 0 /100 WBC (ref 0.0–0.0)
Platelets: 589 10*3/uL — ABNORMAL HIGH (ref 142–346)
RBC: 3.97 10*6/uL — ABNORMAL LOW (ref 4.20–5.90)
RDW: 14 % (ref 11–15)
WBC: 15.49 10*3/uL — ABNORMAL HIGH (ref 3.10–9.50)

## 2018-06-10 LAB — HEMOGLOBIN A1C
Average Estimated Glucose: 111.2 mg/dL
Hemoglobin A1C: 5.5 % (ref 4.6–5.9)

## 2018-06-10 LAB — BASIC METABOLIC PANEL
BUN: 12 mg/dL (ref 9.0–28.0)
CO2: 30 mEq/L — ABNORMAL HIGH (ref 22–29)
Calcium: 9.5 mg/dL (ref 8.5–10.5)
Chloride: 100 mEq/L (ref 100–111)
Creatinine: 0.9 mg/dL (ref 0.7–1.3)
Glucose: 96 mg/dL (ref 70–100)
Potassium: 4.5 mEq/L (ref 3.5–5.1)
Sodium: 139 mEq/L (ref 136–145)

## 2018-06-10 LAB — MAGNESIUM: Magnesium: 1.9 mg/dL (ref 1.6–2.6)

## 2018-06-10 LAB — HIV AG/AB 4TH GENERATION: HIV Ag/Ab, 4th Generation: NONREACTIVE

## 2018-06-10 MED ORDER — ALBUTEROL-IPRATROPIUM 2.5-0.5 (3) MG/3ML IN SOLN
3.00 mL | Freq: Four times a day (QID) | RESPIRATORY_TRACT | Status: DC
Start: 2018-06-10 — End: 2018-06-10
  Administered 2018-06-10 (×2): 3 mL via RESPIRATORY_TRACT
  Filled 2018-06-10 (×2): qty 3

## 2018-06-10 MED ORDER — SODIUM CHLORIDE 0.9 % IV MBP
1000.00 mg | INTRAVENOUS | Status: DC
Start: 2018-06-10 — End: 2018-06-11
  Administered 2018-06-10 – 2018-06-11 (×2): 1000 mg via INTRAVENOUS
  Filled 2018-06-10 (×2): qty 1000

## 2018-06-10 MED ORDER — ALBUTEROL-IPRATROPIUM 2.5-0.5 (3) MG/3ML IN SOLN
3.00 mL | RESPIRATORY_TRACT | Status: DC
Start: 2018-06-10 — End: 2018-06-11
  Administered 2018-06-10 – 2018-06-11 (×2): 3 mL via RESPIRATORY_TRACT
  Filled 2018-06-10 (×3): qty 3

## 2018-06-10 MED ORDER — MAGNESIUM SULFATE IN D5W 1-5 GM/100ML-% IV SOLN
1.0000 g | INTRAVENOUS | Status: AC
Start: 2018-06-10 — End: 2018-06-10
  Administered 2018-06-10 (×2): 1 g via INTRAVENOUS
  Filled 2018-06-10 (×2): qty 100

## 2018-06-10 NOTE — Progress Notes (Addendum)
ID PROGRESS NOTE      Spectra: [phone]    Office: [phone]    Date Time: 06/10/18 @NOW   Patient Name: Craig Phelps, Craig Phelps    Problem List:    Acute Problem List:  Returns for decortication and abscess resection 06/08/18  ;left   --Noted purulent drainage from chest tube and persistent air leak  --Significant pleural inflammation noted, multiple Cx sent  Presented withPleural effusion, pneumothorax 04/2018  -- pigtail catheter placed  Lung abscess  -- sputum: mixed resp flora  -- pleural fluid: mod WBC, no growth, 10K WBC, pH- 7.87  Pleural/debris biopsy from 3/04 (updated report from 3/05)   -- many septate hyphae (fungal growth). Multiple Cx positive, Final ID Coccidiodes immitis  --[place]2017    Chronic Conditions:  H/O tobacco use(quit 10 days ago)  H/O vaping 6 months before  Seizure Disorder  GERD  Pilonidal Cyst    Assessment:     S/p decortication L side with an upper lobe/lower lob wedge resection on 4/20. Pt is well known to our practicfrom a previous hospitalization and office follow up. He was admitted for subacute fevers and spontaneous pneumothorax caused by erosion of a pulmonary cavity into the pleural space. 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 Pt was initially treated with voriconazole and ertapenem for possible bacterial component and was subsequently switched to Fluconazole once the organism was identified. He was noted to have a persistent air leak since discharge. Drainage from the tube became purulent at the end of last week and the pt was brought in for another decortication and resection of the pulmonary abscess. Possibilities include ongoing fungal infection with pt needing source control or a secondary bacterial infection. Will tell by Cx since these were obtained without Abx.      No fevers.  Doing well after surgery on  4/20.  On room air.  WBC 17K, Cr 0.9.  Multiple stains from yesterday's surgery reviewed, no organisms (yeast or bacteria) but will need to follow up the cultures.      Ruptured cavities in cocci are a known entity and typically occur in young, healthy men and are not necessarily suggestive of immunocompromise.  We will check HIV and hemoglobin A1C to be complete.  Immunoglobulins were normal in 2017.      HIV  nonreactive    Vancomycin stopped 06/09/2018     WBC 15 K    BUN/ Cr 12/0.9     No fever      Antimicrobials:   #48 Antifungal therapy  #22 Fluconazole 400mg  PO Qday  #3 Zosyn 4.5 gm IV q 6  Stop   #1 Ertapenem  \1gm IV daily start     Vancomycin stopped 06/09/2018  After 2 days     Plan:   Will adjust antibiotics to Ertapenem  For 5 days pending cx  FTF and midline ordered   Continue Fluconazole   Will need prolonged antifungal    D/w pt   D/w Thoracic surgery   Follow up in our office next week; call for appt     IDP Continuum of Care Program  [phone]  This patient is being evaluated for the need for intravenous antibiotics after discharge    Diagnosis requiring antibiotics: Empyema    ANTIBIOTIC RECOMMENDATION/DISCHARGE PLANNING  Recommendation for Definitive outpatient Antibiotic:  Ertapenem 1 gm IV daily    Discharge Destination:   home   Discharge Planning:  ID requirements for Discharge:    Follow up ID appointment placed:    Discussed with Case Management:    Home Health Face-to-Face Placed:    IV ACCESS RECOMMENDATIONS: Midline  Ordered      For any questions regarding home outpatient antibiotics, please call [phone]          Lines:     piv    Family History:     Family History   Problem Relation Age of Onset    ADD / ADHD Father     ADD / ADHD Brother     Cancer Paternal Grandfather 69        bladder, smoker    Pulmonary fibrosis Maternal Grandmother         also with ITP    Alzheimer's disease Paternal Grandmother 74       Social History:     Social History     Socioeconomic  History    Marital status: Single     Spouse name: Not on file    Number of children: 0    Years of education: Not on file    Highest education level: Not on file   Occupational History    Occupation: Archivist   Social Needs    Financial resource strain: Not on file    Food insecurity:     Worry: Not on file     Inability: Not on file    Transportation needs:     Medical: Not on file     Non-medical: Not on file   Tobacco Use    Smoking status: Former Smoker    Smokeless tobacco: Never Used    Tobacco comment: quit 1 week ago   Substance and Sexual Activity    Alcohol use: Not Currently     Alcohol/week: 0.0 standard drinks    Drug use: No    Sexual activity: Yes     Partners: Female     Comment: monogamous   Lifestyle    Physical activity:     Days per week: Not on file     Minutes per session: Not on file    Stress: Not on file   Relationships    Social connections:     Talks on phone: Not on file     Gets together: Not on file     Attends religious service: Not on file     Active member of club or organization: Not on file     Attends meetings of clubs or organizations: Not on file     Relationship status: Not on file    Intimate partner violence:     Fear of current or ex partner: Not on file     Emotionally abused: Not on file     Physically abused: Not on file     Forced sexual activity: Not on file   Other Topics Concern    Not on file   Social History Narrative    Taking a break from school    Wanting to transfer for the fall    Working at [employer]        Exercise: no     Diet: nothing special, tries to avoid GERD inducing foods       Allergies:     Allergies   Allergen Reactions    Other      Environmental allergies       Review of Systems:   General ROS: negative for - chills, fevers  HEENT: negative for - blurry vision, sore throat, thrush   Respiratory ROS: negative for cough, SOB  Cardiovascular ROS: negative for - chest pain, palpitations  + CT left  Gastrointestinal ROS:  negative for - abdominal pain, nausea, vomiting, diarrhea  Genito-Urinary ROS: negative for - dysuria, urinary frequency/urgency   Musculoskeletal ROS: negative for - joint pain, joint stiffness or muscle pain   Dermatological ROS: negative for - rash and skin lesion changes   Neurological ROS: negative for - confusion, headache, dizziness  Hematological ROS: negative for - bruising, bleeding   Psychological ROS: negative for - changes in mood    Physical Exam:     Vitals:    06/10/18 0900   BP:    Pulse: 93   Resp:    Temp:    SpO2:        General Appearance: alert and appropriate, non-toxic  Neuro: alert, oriented  HEENT: no scleral icterus  Neck: supple  Lungs:  Left CT  Cardiac: normal rate  Extremities: no pedal edema  Skin: no rash  Psych: normal mood and affect    Labs:     Lab Results   Component Value Date    WBC 15.49 (H) 06/10/2018    HGB 10.0 (L) 06/10/2018    HCT 32.0 (L) 06/10/2018    MCV 80.6 06/10/2018    PLT 589 (H) 06/10/2018     Lab Results   Component Value Date    CREAT 0.9 06/10/2018     Lab Results   Component Value Date    ALT 14 05/05/2018    AST 18 05/05/2018    ALKPHOS 114 (H) 05/05/2018    BILITOTAL 0.4 05/05/2018     No results found for: LACTATE    Microbiology:     Microbiology Results     Procedure Component Value Units Date/Time    AFB culture and smear [[id number]] Collected:  06/08/18 1130    Specimen:  Sputum from Tissue, Deep Updated:  06/09/18 1107    Narrative:       ORDER#: [id number]                                    ORDERED BY: Dorothey Baseman, SANDE  SOURCE: Tissue, Deep Visceral Pleural Rind           COLLECTED:  06/08/18 11:30  ANTIBIOTICS AT COLL.:                                RECEIVED :  06/08/18 16:16  Stain, Acid Fast                           FINAL       06/09/18 11:07  06/09/18   No Acid Fast Bacillus Seen  Culture Acid Fast Bacillus (AFB)           PENDING      AFB culture and smear [[id number]] Collected:  06/08/18 1130    Specimen:  Sputum from Tissue, Deep Updated:   06/09/18 1107    Narrative:       ORDER#: [id number]                                    ORDERED BY: Letitia Libra  SOURCE: Tissue, Deep Parietal Pleural Rind           COLLECTED:  06/08/18 11:30  ANTIBIOTICS AT COLL.:                                RECEIVED :  06/08/18 16:16  Stain, Acid Fast                           FINAL       06/09/18 11:07  06/09/18   No Acid Fast Bacillus Seen  Culture Acid Fast Bacillus (AFB)           PENDING      AFB culture and smear [[id number]] Collected:  06/08/18 1416    Specimen:  Sputum from Tissue, Deep Updated:  06/09/18 1107    Narrative:       ORDER#: [id number]                                    ORDERED BY: Dorothey Baseman, SANDE  SOURCE: Tissue, Deep upper/lower lobe wedge          COLLECTED:  06/08/18 14:16  ANTIBIOTICS AT COLL.:                                RECEIVED :  06/08/18 19:08  Stain, Acid Fast                           FINAL       06/09/18 11:07  06/09/18   No Acid Fast Bacillus Seen  Culture Acid Fast Bacillus (AFB)           PENDING      AFB culture and smear [[id number]] Collected:  06/08/18 1050    Specimen:  Sputum from Tissue, Deep Updated:  06/08/18 2215    Narrative:       ORDER#: [id number]                                    ORDERED BY: Dorothey Baseman, SANDE  SOURCE: Tissue, Deep Pleural Biopsy                  COLLECTED:  06/08/18 10:50  ANTIBIOTICS AT COLL.:                                RECEIVED :  06/08/18 14:47  Stain, Acid Fast                           FINAL       06/08/18 22:15  06/08/18   No Acid Fast Bacillus Seen  Culture Acid Fast Bacillus (AFB)           PENDING      AFB culture and smear [[id number]] Collected:  06/08/18 1050    Specimen:  Sputum from Tissue, Deep Updated:  06/08/18 2215    Narrative:       ORDER#: [id number]  ORDERED BY: KHANDHAR, SANDE  SOURCE: Tissue, Deep Pleural Biopsy #2               COLLECTED:  06/08/18 10:50  ANTIBIOTICS AT COLL.:                                RECEIVED :  06/08/18 14:45  Stain, Acid  Fast                           FINAL       06/08/18 22:15  06/08/18   No Acid Fast Bacillus Seen  Culture Acid Fast Bacillus (AFB)           PENDING      Anaerobic culture [[id number]] Collected:  06/08/18 1416    Specimen:  Other from Lung Updated:  06/08/18 1908    Anaerobic culture [[id number]] Collected:  06/08/18 1130    Specimen:  Other from Tissue Updated:  06/08/18 1616    Anaerobic culture [[id number]] Collected:  06/08/18 1130    Specimen:  Other from Lung Updated:  06/08/18 1616    Anaerobic culture [[id number]] Collected:  06/08/18 1050    Specimen:  Other from Lung Updated:  06/08/18 1447    Anaerobic culture [[id number]] Collected:  06/08/18 1050    Specimen:  Other from Lung Updated:  06/08/18 1445    Fungus culture [[id number]] Collected:  06/08/18 1130    Specimen:  Other from Lung Updated:  06/09/18 1109    Narrative:       ORDER#: [id number]                                    ORDERED BY: Dorothey Baseman, SANDE  SOURCE: Lung Parietal Pleural Rind                   COLLECTED:  06/08/18 11:30  ANTIBIOTICS AT COLL.:                                RECEIVED :  06/08/18 16:16  Stain, Fungal                              FINAL       06/09/18 11:08  06/09/18   No Fungal or Yeast Elements Seen  Culture Fungus                             PENDING      Fungus culture [[id number]] Collected:  06/08/18 1416    Specimen:  Other from Lung Updated:  06/09/18 1109    Narrative:       ORDER#: [id number]                                    ORDERED BY: Dorothey Baseman, SANDE  SOURCE: Lung upper/lower lobe wedge                  COLLECTED:  06/08/18 14:16  ANTIBIOTICS AT COLL.:  RECEIVED :  06/08/18 19:08  Stain, Fungal                              FINAL       06/09/18 11:08  06/09/18   No Fungal or Yeast Elements Seen  Culture Fungus                             PENDING      Fungus culture [[id number]] Collected:  06/08/18 1050    Specimen:  Other from Lung Updated:  06/09/18 1109    Narrative:       ORDER#:  [id number]                                    ORDERED BY: Dorothey Baseman, SANDE  SOURCE: Lung Pleural Biopsy #2                       COLLECTED:  06/08/18 10:50  ANTIBIOTICS AT COLL.:                                RECEIVED :  06/08/18 14:45  Stain, Fungal                              FINAL       06/09/18 11:08  06/09/18   No Fungal or Yeast Elements Seen  Culture Fungus                             PENDING      Fungus culture [[id number]] Collected:  06/08/18 1130    Specimen:  Other from Tissue Updated:  06/09/18 1109    Narrative:       ORDER#: [id number]                                    ORDERED BY: Dorothey Baseman, SANDE  SOURCE: Tissue Visceral Pleural Rind                 COLLECTED:  06/08/18 11:30  ANTIBIOTICS AT COLL.:                                RECEIVED :  06/08/18 16:16  Stain, Fungal                              FINAL       06/09/18 11:08  06/09/18   No Fungal or Yeast Elements Seen  Culture Fungus                             PENDING      Fungus culture [[id number]] Collected:  06/08/18 1050    Specimen:  Other from Lung Updated:  06/09/18 1109    Narrative:       ORDER#: [id number]  ORDERED BY: KHANDHAR, SANDE  SOURCE: Lung Pleural Biopsy                          COLLECTED:  06/08/18 10:50  ANTIBIOTICS AT COLL.:                                RECEIVED :  06/08/18 14:47  Stain, Fungal                              FINAL       06/09/18 11:08  06/09/18   No Fungal or Yeast Elements Seen  Culture Fungus                             PENDING      Wound culture & gram stain [[id number]] Collected:  06/08/18 1050    Specimen:  Wound Updated:  06/09/18 2109    Narrative:       ORDER#: [id number]                                    ORDERED BY: Dorothey Baseman, SANDE  SOURCE: Wound Pleural Biopsy #2                      COLLECTED:  06/08/18 10:50  ANTIBIOTICS AT COLL.:                                RECEIVED :  06/08/18 14:45  Stain, Gram                                FINAL       06/08/18 20:07  06/08/18    Many WBCs             No Squamous epithelial cells seen             No organisms seen  Culture and Gram Stain, Aerobic, Wound     PRELIM      06/09/18 21:09  06/09/18   Culture no growth to date, Final report to follow      Wound culture & gram stain [[id number]] Collected:  06/08/18 1130    Specimen:  Wound Updated:  06/09/18 1849    Narrative:       ORDER#: [id number]                                    ORDERED BY: Dorothey Baseman, SANDE  SOURCE: Wound Parietal Pleural Rind                  COLLECTED:  06/08/18 11:30  ANTIBIOTICS AT COLL.:                                RECEIVED :  06/08/18 16:16  Stain, Gram                                FINAL  06/08/18 20:12  06/08/18   Few WBCs             No Squamous epithelial cells seen             No organisms seen  Culture and Gram Stain, Aerobic, Wound     PRELIM      06/09/18 18:49  06/09/18   Culture no growth to date, Final report to follow      Wound culture & gram stain [[id number]] Collected:  06/08/18 1130    Specimen:  Wound Updated:  06/09/18 1849    Narrative:       ORDER#: [id number]                                    ORDERED BY: Dorothey Baseman, SANDE  SOURCE: Wound Visceral Pleural Rind                  COLLECTED:  06/08/18 11:30  ANTIBIOTICS AT COLL.:                                RECEIVED :  06/08/18 16:16  Stain, Gram                                FINAL       06/08/18 20:11  06/08/18   Few WBCs             No Squamous epithelial cells seen             No organisms seen  Culture and Gram Stain, Aerobic, Wound     PRELIM      06/09/18 18:49  06/09/18   Culture no growth to date, Final report to follow      Wound culture & gram stain [[id number]] Collected:  06/08/18 1416    Specimen:  Wound from Lesion Updated:  06/09/18 1756    Narrative:       ORDER#: [id number]                                    ORDERED BY: Dorothey Baseman, SANDE  SOURCE: Lesion upper/lower lobe wedge                COLLECTED:  06/08/18 14:16  ANTIBIOTICS AT COLL.:                                RECEIVED :   06/08/18 19:08  Stain, Gram                                FINAL       06/08/18 20:26  06/08/18   Moderate WBCs             No Squamous epithelial cells seen             No organisms seen  Culture and Gram Stain, Aerobic, Wound     PRELIM      06/09/18 17:56  06/09/18   Culture no growth to date, Final report to follow      Wound culture & gram stain [[id number]] Collected:  06/08/18 1050  Specimen:  Wound Updated:  06/09/18 1728    Narrative:       ORDER#: [id number]                                    ORDERED BY: Dorothey Baseman, SANDE  SOURCE: Wound Pleural Biopsy                         COLLECTED:  06/08/18 10:50  ANTIBIOTICS AT COLL.:                                RECEIVED :  06/08/18 14:47  Stain, Gram                                FINAL       06/08/18 20:07  06/08/18   Few WBCs             No Squamous epithelial cells seen             No organisms seen  Culture and Gram Stain, Aerobic, Wound     PRELIM      06/09/18 17:28  06/09/18   Culture no growth to date, Final report to follow            Rads:   X-ray Chest Ap Portable (daily)    Result Date: 06/10/2018  Interval decrease in a small loculated component at the level of the left lung base. There has been no other significant interval change. Stephannie Peters, MD 06/10/2018 9:40 AM      Signed by: Dorena Cookey, MD

## 2018-06-10 NOTE — Discharge Instr - AVS First Page (Addendum)
Discharge Instructions for Video Assisted Thoracosopic Surgery (VATS)/Decortication    Follow-Up  Your follow-up appointment is on Friday, May 1 @ 1 pm with Dr. Drucie Opitz Nurse Practitioner, Melida Quitter.    You will need to follow up with [facility]. Please call their office [phone] to schedule an appointment to be seen next week.     You will need a chest x-ray on the day of your follow up appointment Dr. Lolita Rieger   You do not need to make an appointment to have the x-ray done   Simply hand carry the order form (which you were given at discharge) to [facility]   Please ask them to put a copy of the x-ray on a disc for you to bring to your follow up appointment    Home Care   Leave your chest tube dressing in place until the day after you are discharged. The dressing is waterproof; you may get it wet in the shower   Remove the dressing from your chest tube site on the day after discharge and leave the site open to air.  ? It is normal to notice some oozing of fluid from the chest tube site (especially with coughing)  ? Wear an old T-Shirt to keep your clothes from becoming soiled   Shower daily. Wash your incisions gently with mild soap and warm water. Pat dry and leave the incision open to air    Do not take a bath or swim until cleared by your surgeon   Use your incentive spirometer (blue) 10 times/hour for the first 2 weeks at home   Return to your normal diet as you feel able. Eat a healthy, well-balanced diet.   Avoid constipation  ? Narcotic pain medications (Dilaudid, Percocet, Vicodin) can cause constipation   ? Drink a variety of fluids and eat a high fiber diet to help avoid becoming constipated  ? You may use any over the counter laxative of your choice such as Miralax, Senokot, Dulcolax, fleets enema etc. if you become constipated    Medications   You will be given instructions with your discharge paperwork regarding which medications you are  to resume and which medications should be stopped   Pain Medication   We recommend the following pain medication regimen:   Acetaminophen (Tylenol) 500 mg - 2 tablets every 6 hours as needed for mild pain   Ibuprofen (Advil, Motrin) 200 mg - 2 tablets every 6 hours as needed for mild/moderate pain   Tramadol 50 mg - 1 tablet every 6 hours as needed for severe pain.        A sample schedule which works well for most patients is:  o Ibuprofen with Breakfast, Lunch, Dinner   o Tylenol in-between meals  o Tramadol for severe pain not alleviated by above    Activity   There are no restrictions on your activity   Dont worry if you are fatigued. Fatigue and weakness normally last a few weeks after surgery   You will gain strength as you recover completely   Walking is the best exercise. Walk every day and be active!!    Driving  Do not drive for 24 hours after taking any narcotic pain medications    Miscellaneous   Due to the breathing tube used during your operation and the nature of lung surgery it is common to cough up blood tinged mucus or even small blood clots. This is normal and not cause for alarm  If however, you cough up large amounts of blood (larger than a tablespoon) then you need to notify your surgeon or the surgeon on call if it is after hours    Notify your surgeon at [phone] if you experience any of the following:   Fever greater than 101.0   Sudden shortness of breath   Significant redness, swelling, tenderness of your surgical incisions    Changes in scheduling: [phone]  Emergency or On-Call physician: [phone]    Home Health Discharge Information     Your doctor has ordered [employer] and infusion nurse in-home service(s) for you while you recuperate at home, to assist you in the transition from hospital to home.    [facility]:  Name of Infusion Company Placement: [facility] [phone]  Infusion: Ertapenem daily  Start on: Date: 06/12/2018   The  above services were set up by:  Zahki Hoogendoorn L. [facility]  [facility] Liaison)   Phone ,[phone]     Additional comments:[facility] home Infusion nurse to come to home, Friday, 06/12/2018.    IF YOU HAVE NOT HEARD FROM YOUR HOME YOUR HOME HEALTH AGENCY WITHIN 24 HOURS AFTER DISCHARGE PLEASE CALL YOUR AGENCY TO ARRANGE A TIME FOR YOUR FIRST VISIT. FOR ANY SCHEDULING CONCERNS OR QUESTIONS RELATED TO HOME HEALTH, SUCH AS TIME OR DATE PLEASE CONTACT YOUR HOME HEALTH AGENCY AT THE NUMBER LISTED ABOVE.

## 2018-06-10 NOTE — Progress Notes (Signed)
Thoracic Surgery Daily Progress Note  Team Spectra: [phone] / [phone]      Hospital Day: Hospital Day: 3  Primary Procedure: L VATS decortication, resection of abscess    Assessment:   Craig Phelps is a 23 y.o. 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 Doing well but w/ persistent air leak    Plan:   - Chest tube to water seal  - Daily AM CXR  - Ambulation as much as possible, goal is >20 laps, encourage IS  - Q4 nebs, pap therapy  - Will f/u am labs  - PRN tramadol for pain control  - Abx zosyn, diflucan (coccidioides). Appreciate ID recs  - Regular diet, bowel regimen    Interval History:   24 hour Events: Regular ambulation: 25 laps. Notes pain primarily when waking up - pain is well controlled w/ PRN tramadol    Subjective: Feels well this morning. Intermittent pain episodes well-controlled with tramadol. Ambulating regularly. Denies shortness of breath.    Physical Exam:   Current Vitals:   BP 108/63    Pulse 88    Temp 98.2 F (36.8 C) (Oral)    Resp 18    Wt 69.8 kg (153 lb 12.8 oz)    SpO2 98%    BMI 20.85 kg/m       GEN: sitting up in chair, NAD   COR: RRR  LUNGS: B/L CTAB, good respiratory effort  L CT w/ serosagnuinous output, + airleak  INCISION/WOUND: well approximated w/o erythema, drainage; dermabond intact   ABD: soft, nttp, non-distended  EXTREMITIES: warm, no edema    Lines/Drains:   L Chest Tube: 70mL serosanguinous drainage in 24hrs. Continued airleak - primarily expiratory.     Laboratory and Radiological Results:   Pending am CXR 4/22.    Lab Results   Component Value Date    WBC 17.12 (H) 06/09/2018    HGB 11.6 (L) 06/09/2018    HCT 35.4 (L) 06/09/2018    MCV 78.5 06/09/2018    PLT 657 (H) 06/09/2018     Lab Results   Component Value Date    NA 136 06/09/2018    K 4.8 06/09/2018    CL 101 06/09/2018    CO2 20 (L) 06/09/2018    BUN 9.0  06/09/2018    CREAT 0.9 06/09/2018    MG 2.1 06/09/2018         Xr Chest Ap Portable    Result Date: 06/09/2018   Stable postop change noted in the chest. Geanie Cooley, MD 06/09/2018 8:39 AM

## 2018-06-10 NOTE — Procedures (Signed)
MIDLINE INSERTION PROCEDURE     [name], [name]  06/10/2018    INDICATIONS: IV Invanz therapy less than 28 days    The midline procedure, risks, benefits were discussed with thepatient.  All questions were answered  patient verbalized understanding and agreed to proceed. Midline education/instructions provided to patient.    PROCEDURE DETAILS:   The patient was positioned and Ultrasound was used to confirm patency of the Left Brachial vein prior to obtaining venous access. The arm was scrubbed with 2% chlorhexidine per guidelines and a maximal sterile field was established for the patient.  The clinician was attired with cap, mask and sterile gown/gloves prior to start.     Trimable Arrow 15cm Power Midline:  A sterile cover was sheathed to the Ultrasound probe.  The vein was then revisualized and 1% lidocaine injected prior to puncture of the Left Brachial vein with a 21-gauge single-wall needle under direct sonographic guidance.  The guidewire was advanced through the needle, the needle was removed and a peel-away sheath was placed over the wire.  After measuring from the insertion site to the midline of the upper arm the catheter was trimmed to insure tip location below the axillary.  The catheter was then advanced through the peel-away sheath.  The sheath was removed.  The catheter was flushed with normal saline to confirm brisk blood return and capped.  The catheter was stabilized on the skin using a securement device.  Antimicrobial disk and sterile transparent occlusive dressing applied using aseptic technique.    Patient did tolerate the procedure well.   Catheter Type: 4Fr power Midline  Insertion Site: Left Brachial vein  Total length: 12cm  Internal Length:  12cm  External Length: 0cm  UAC: 26cm    Midline Reference #: [id number]  Midline Kit Lot#: [id number]  Midline Kit expiration date: 2019-05-19    Findings/Conclusions:  No signs of bleeding or symptoms of nerve irritation noted at time of  insertion procedure.    Tip location is below the level of the axilla in Brachial vein. Midline is ready for immediate use.    Midline is ready for immediate use    Craig Phelps Perlie Gold, RN

## 2018-06-10 NOTE — Plan of Care (Addendum)
Problem: Safety  Goal: Patient will be free from injury during hospitalization  Outcome: Progressing  Flowsheets (Taken 06/10/2018 1642)  Patient will be free from injury during hospitalization : Assess patient's risk for falls and implement fall prevention plan of care per policy;Provide and maintain safe environment;Use appropriate transfer methods;Ensure appropriate safety devices are available at the bedside;Include patient/ family/ care giver in decisions related to safety;Hourly rounding  Goal: Patient will be free from infection during hospitalization  Outcome: Progressing  Flowsheets (Taken 06/10/2018 1642)  Free from Infection during hospitalization: Assess and monitor for signs and symptoms of infection;Monitor lab/diagnostic results;Monitor all insertion sites (i.e. indwelling lines, tubes, urinary catheters, and drains);Encourage patient and family to use good hand hygiene technique     Problem: Pain  Goal: Pain at adequate level as identified by patient  Outcome: Progressing  Flowsheets (Taken 06/10/2018 1642)  Pain at adequate level as identified by patient: Identify patient comfort function goal;Assess pain on admission, during daily assessment and/or before any "as needed" intervention(s);Reassess pain within 30-60 minutes of any procedure/intervention, per Pain Assessment, Intervention, Reassessment (AIR) Cycle;Evaluate if patient comfort function goal is met;Evaluate patient's satisfaction with pain management progress;Offer non-pharmacological pain management interventions    SHIFT NOTE   Neuro: A&Ox4.   OOB: independent.  Pain: Pain managed w/ tramadol.   Cardiac/Rhythm: NSR to Sinus Tachy on tele (HR 70-110's). SBP 110s'-120s.   Resp: >98% on RA.   Diet: Regular diet tolerated well. Mother brought food and Gatorade today.    GI/GU: LBM 4/20 +flatus. Voiding adequately.   Incisions/Skin: Left chest incision is CDI, OTA.    LDA's: Left chest tube x 1 to water seal, air leak present (team aware),  draining serosanguinous fluid, dressing changed by PA Juli. CT output of 30mL.    IV's: Right hand PIV's, saline locked, flushed. Left upper arm midline, saline locked.    ?   Labs: Mg (1.9), replaced with 2g of Mg.   Procedures/Imaging: Daily chest x-rays.   Plan: To discharge to home tomorrow, pt aware that he will have to continue IV antibiotics at home via midline. Continue to increase mobility. Updated pt's mother today.    ?   Comments: Fall safety reinforced. Call bell, personal belongings, and room phone within pt's reach; will continue to monitor with hourly rounds and report any changes.

## 2018-06-10 NOTE — Progress Notes (Signed)
Pt requested Tx be made Q6WA

## 2018-06-10 NOTE — Plan of Care (Addendum)
Problem: Safety  Goal: Patient will be free from injury during hospitalization  Outcome: Progressing  Goal: Patient will be free from infection during hospitalization  Outcome: Progressing     Problem: Pain  Goal: Pain at adequate level as identified by patient  Outcome: Progressing     Problem: Side Effects from Pain Analgesia  Goal: Patient will experience minimal side effects of analgesic therapy  Outcome: Progressing     Problem: Discharge Barriers  Goal: Patient will be discharged home or other facility with appropriate resources  Outcome: Progressing     Problem: Psychosocial and Spiritual Needs  Goal: Demonstrates ability to cope with hospitalization/illness  Outcome: Progressing    Aox4, Tramadol x1 given PRN for left midsteral pain, noted to be effective after 30 minutes. HR 90-120, Normal Sinus Rhythm, BP 100-135. Chest tube x1 to water seal on left side, serosanguinous drainage, 50ml, noted air leak, team aware. Pt gets daily chest x-rays. Good intake noted. Able to walk independently, walked 2825ft. Continued on IV Zosyn for Empyema, tolerated well. Incision on left posterior side is covered with Dermabond.

## 2018-06-11 ENCOUNTER — Inpatient Hospital Stay: Payer: BLUE CROSS/BLUE SHIELD

## 2018-06-11 LAB — CBC
Absolute NRBC: 0 10*3/uL (ref 0.00–0.00)
Hematocrit: 30.7 % — ABNORMAL LOW (ref 37.6–49.6)
Hgb: 9.4 g/dL — ABNORMAL LOW (ref 12.5–17.1)
MCH: 24.8 pg — ABNORMAL LOW (ref 25.1–33.5)
MCHC: 30.6 g/dL — ABNORMAL LOW (ref 31.5–35.8)
MCV: 81 fL (ref 78.0–96.0)
MPV: 9.1 fL (ref 8.9–12.5)
Nucleated RBC: 0 /100 WBC (ref 0.0–0.0)
Platelets: 556 10*3/uL — ABNORMAL HIGH (ref 142–346)
RBC: 3.79 10*6/uL — ABNORMAL LOW (ref 4.20–5.90)
RDW: 14 % (ref 11–15)
WBC: 12.56 10*3/uL — ABNORMAL HIGH (ref 3.10–9.50)

## 2018-06-11 LAB — BASIC METABOLIC PANEL
BUN: 11 mg/dL (ref 9.0–28.0)
CO2: 27 mEq/L (ref 22–29)
Calcium: 9 mg/dL (ref 8.5–10.5)
Chloride: 100 mEq/L (ref 100–111)
Creatinine: 0.9 mg/dL (ref 0.7–1.3)
Glucose: 100 mg/dL (ref 70–100)
Potassium: 4.2 mEq/L (ref 3.5–5.1)
Sodium: 138 mEq/L (ref 136–145)

## 2018-06-11 LAB — GFR: EGFR: >60

## 2018-06-11 LAB — MAGNESIUM: Magnesium: 1.6 mg/dL (ref 1.6–2.6)

## 2018-06-11 MED ORDER — MAGNESIUM SULFATE IN D5W 1-5 GM/100ML-% IV SOLN
1.0000 g | INTRAVENOUS | Status: AC
Start: 2018-06-11 — End: 2018-06-11
  Administered 2018-06-11 (×2): 1 g via INTRAVENOUS
  Filled 2018-06-11 (×3): qty 100

## 2018-06-11 MED ORDER — FLUCONAZOLE 200 MG PO TABS
400.00 mg | ORAL_TABLET | ORAL | 0 refills | Status: AC
Start: 2018-06-12 — End: 2018-07-12

## 2018-06-11 MED ORDER — ERTAPENEM SODIUM 1 G IJ SOLR
1000.00 mg | INTRAMUSCULAR | 0 refills | Status: AC
Start: 2018-06-11 — End: 2018-06-15

## 2018-06-11 MED ORDER — TRAMADOL HCL 50 MG PO TABS
50.00 mg | ORAL_TABLET | Freq: Four times a day (QID) | ORAL | 0 refills | Status: AC | PRN
Start: 2018-06-11 — End: 2018-06-18

## 2018-06-11 NOTE — Progress Notes (Signed)
Case Management Discharge Checklist    PLAN:      Transport plan: Mother           06/11/18 1345   Discharge Disposition   Patient preference/choice provided? Yes   Physical Discharge Disposition Home, Home Health   Name of Home Health Agency Placement [facility]   Mode of Transportation Car  (Mother)   Patient/Family/POA notified of transfer plan Patient informed only   Patient agreeable to discharge plan/expected d/c date? Yes   Bedside nurse notified of transport plan? Yes   Outpatient Services   Home Health Skilled Nursing   CM Interventions   Follow up appointment scheduled? No   Reason no follow up scheduled? Family to schedule   Referral made for home health RN visit? Yes   Multidisciplinary rounds/family meeting before d/c? Yes   Medicare Checklist   Is this a Medicare patient? No   Patient received 1st IMM Letter? n/a     Adora Fridge  RN Case Manager  [facility]  Case Management Department  [phone]

## 2018-06-11 NOTE — Plan of Care (Signed)
Patient is awake, alert and oriented, finished 20 laps yesterday before midnight, Lt chest tube to waterseal, vitals stable, sinus rhythm/sinus tach on telemetry, pain controlled with po tramadol and lidocaine patch, chest tube output 40cc for night shift, voiding fine, no BM tonight, had food from home for dinner, knows that if chest tube is dcd , he might go home, gave teaching on smoking cessation, will continue to monitor closely.

## 2018-06-11 NOTE — Plan of Care (Signed)
Problem: Pain  Goal: Pain at adequate level as identified by patient  Outcome: Progressing  Flowsheets (Taken 06/11/2018 1233)  Pain at adequate level as identified by patient: Identify patient comfort function goal; Assess pain on admission, during daily assessment and/or before any "as needed" intervention(s); Reassess pain within 30-60 minutes of any procedure/intervention, per Pain Assessment, Intervention, Reassessment (AIR) Cycle; Evaluate if patient comfort function goal is met; Evaluate patient's satisfaction with pain management progress; Offer non-pharmacological pain management interventions     Problem: Discharge Barriers  Goal: Patient will be discharged home or other facility with appropriate resources  Outcome: Progressing  Flowsheets (Taken 06/11/2018 1233)  Discharge to home or other facility with appropriate resources: Provide appropriate patient education; Initiate discharge planning     Problem: Safety  Goal: Patient will be free from injury during hospitalization  Outcome: Adequate for Discharge  Flowsheets (Taken 06/11/2018 1233)  Patient will be free from injury during hospitalization : Assess patient's risk for falls and implement fall prevention plan of care per policy; Provide and maintain safe environment; Use appropriate transfer methods; Ensure appropriate safety devices are available at the bedside; Hourly rounding; Include patient/ family/ care giver in decisions related to safety  Goal: Patient will be free from infection during hospitalization  Outcome: Adequate for Discharge  Flowsheets (Taken 06/11/2018 1233)  Free from Infection during hospitalization: Assess and monitor for signs and symptoms of infection; Monitor lab/diagnostic results; Monitor all insertion sites (i.e. indwelling lines, tubes, urinary catheters, and drains); Encourage patient and family to use good hand hygiene technique    SHIFT NOTE   Neuro: A&Ox4.   OOB: independent.   Pain: Pain managed w/ tramadol and  lidocaine patch.   Cardiac/Rhythm: NSR and Sinus Tachy on tele (HR 80-130's). SBP 's 110's.   Resp: >98% on RA.   Diet: Regular diet tolerated well.   GI/GU: LBM 4/20+flatus. Voiding adequately.   Incisions/Skin: Left chest incision is CDI, dermabond. Left flank (previous chest tube site), CDI, guaze/tape.   LDA's: Chest tube to water seal removed by PA Juli for discharge.   IV's: Right hand PIV removed prior to discharge. Midline, Left upper arm, saline locked.    ?   Comments: Explained discharge instructions and reviewed medications with patient. Discharge to home with mother.   ?

## 2018-06-11 NOTE — Plan of Care (Signed)
Problem: Safety  Goal: Patient will be free from injury during hospitalization  06/11/2018 0421 by Delanna Ahmadi, RN  Outcome: Progressing  06/11/2018 0416 by Delanna Ahmadi, RN  Outcome: Progressing  06/11/2018 0409 by Delanna Ahmadi, RN  Outcome: Progressing  Goal: Patient will be free from infection during hospitalization  06/11/2018 0421 by Delanna Ahmadi, RN  Outcome: Progressing  06/11/2018 0416 by Delanna Ahmadi, RN  Outcome: Progressing  06/11/2018 0409 by Delanna Ahmadi, RN  Outcome: Progressing     Problem: Pain  Goal: Pain at adequate level as identified by patient  06/11/2018 0421 by Delanna Ahmadi, RN  Outcome: Progressing  06/11/2018 0416 by Delanna Ahmadi, RN  Outcome: Progressing  06/11/2018 0409 by Delanna Ahmadi, RN  Outcome: Progressing     Problem: Side Effects from Pain Analgesia  Goal: Patient will experience minimal side effects of analgesic therapy  06/11/2018 0421 by Delanna Ahmadi, RN  Outcome: Progressing  06/11/2018 0416 by Delanna Ahmadi, RN  Outcome: Progressing  06/11/2018 0409 by Delanna Ahmadi, RN  Outcome: Progressing     Problem: Discharge Barriers  Goal: Patient will be discharged home or other facility with appropriate resources  06/11/2018 0421 by Delanna Ahmadi, RN  Outcome: Progressing  06/11/2018 0416 by Delanna Ahmadi, RN  Outcome: Progressing  06/11/2018 0409 by Delanna Ahmadi, RN  Outcome: Progressing     Problem: Psychosocial and Spiritual Needs  Goal: Demonstrates ability to cope with hospitalization/illness  06/11/2018 0421 by Delanna Ahmadi, RN  Outcome: Progressing  06/11/2018 0416 by Delanna Ahmadi, RN  Outcome: Progressing  06/11/2018 0409 by Delanna Ahmadi, RN  Outcome: Progressing

## 2018-06-11 NOTE — Plan of Care (Signed)
Patient progressing well

## 2018-06-11 NOTE — Progress Notes (Signed)
Home Health Referral          Referral from [facility] (Case Manager) for home health care upon discharge.    By [employer], the patient has the right to freely choose a home care provider.  Arrangements have been made with:     A company of the patients choosing. We have supplied the patient with a listing of providers in your area who asked to be included and participate in Medicare.   [facility], formerly [facility], a home care agency that provides adult home care services and participates in Medicare   The preferred provider of your insurance company. Choosing a home care provider other than your insurance company's preferred provider may affect your insurance coverage.      Home Health Discharge Information     Your doctor has ordered [employer] and infusion nurse in-home service(s) for you while you recuperate at home, to assist you in the transition from hospital to home.    The Infusion Company:  Name of Infusion Company Placement: [facility] [phone]  Infusion: Ertapenem daily  Start on: Date: 06/12/2018   The above services were set up by:  Yenni Carra L. [facility]  [facility] Liaison)   Phone ,[phone]     Additional comments:[facility] home [facility]fusion nurse to come to home, Friday, 06/12/2018.    IF YOU HAVE NOT HEARD FROM YOUR HOME YOUR HOME HEALTH AGENCY WITHIN 24 HOURS AFTER DISCHARGE PLEASE CALL YOUR AGENCY TO ARRANGE A TIME FOR YOUR FIRST VISIT. FOR ANY SCHEDULING CONCERNS OR QUESTIONS RELATED TO HOME HEALTH, SUCH AS TIME OR DATE PLEASE CONTACT YOUR HOME HEALTH AGENCY AT THE NUMBER LISTED ABOVE.    HOME HEALTH REFERRAL    PATIENT"S DEMOGRAPHICS:        Name: Craig Phelps    Discharge Address: [address]  [place] [address]      Primary Telephone Number:  [phone] patient  Secondary Telephone Number: [phone] mother  Emergency Contact and Number: Extended Emergency Contact Information  Primary Emergency Contact: Amick,Caron  Address:  [address]           [place], [place] [address] [place]  Home Phone: [phone]  Mobile Phone: [phone]  Relation: Mother        Ordering Physician: Almira Coaster    Following Physician: Thamas Jaegers    PCP: Threasa Alpha, MD, [phone]    Language/Communication Barrier:    No    Primary Diagnosis and Reason for Services:    Empyema  S/P TITLE OF PROCEDURE:06/08/2018  Left thoracoscopic complete decortication with upper lobe/lower lobe wedge  resection.        [employer] (Labs, Wounds, Infusions, etc.):  Ertapenem 1 gm IV daily through 06/15/2018 ; CBC, CMP, sed rate, CRP on 06/13/2018    Additional Comments:  Patient getting dose on 06/11/2018 at hospital prior to [place].    Questions Relating to COVID-19:    1. Have you been out of the country in the past 2 weeks? NO  2. Do you have any upper respiratory symptoms (cough, SOB, fever)?  NO  3. Have you been exposed to anyone with COVID-19 virus? NO    Discharge Date: 06/11/2018   [facility] Date:06/12/2018  Referral Source (PACC/Hospital/Unit): Nastasha Reising L. Romeo Apple, RN  Referral Date: 06/11/18    Home Health face-to-face (FTF) Encounter (Order [id number])   Consult   Date: 06/10/2018 Department: [facility] Ordering/Authorizing: Dorena Cookey, MD   Order Information  Order Date/Time Release Date/Time Start Date/Time End Date/Time   06/10/18 10:25 AM None 06/10/18 10:24 AM 06/10/18 10:24 AM   Order Details     Frequency Duration Priority Order Class   Once 1 occurrence Routine Hospital Performed   Standing Order Information     Remaining Occurrences Interval Last Released      0/1 Once 06/10/2018            Provider Information     Ordering User Ordering Provider Authorizing Provider   Ambardar, Junius Finner, MD Ambardar, Junius Finner, MD Ambardar, Junius Finner, MD   Attending Provider(s) Admitting Provider PCP   Madilyn Hook, MD Madilyn Hook, MD Threasa Alpha, MD   Order Questions     Question Answer Comment   Date of  face-to-face (FTF) encounter: 06/10/2018    Medical conditions that necessitate Home Health care: K. Central line/IV infusion requiring monitoring and management    Clinical findings that support the need for Skilled Nursing. SN will: E. Educate on IV antibiotic administration and monitor response to treatment    Clinical findings that support the need for Physical Therapy. PT will K. N/A    Clinical findings support the need for OT (needs SN/PT order).OT will F. N/A    Clinical findings that support the need for SLP. ST will F. N/A    Per clinical findings, following services are medically necessary: Skilled Nursing    Evidence this patient is homebound because: O. N/A DME only    IVs IV Antibiotics    Other (please specify) Ertapenem 1 gm IV daily through 06/15/2018 ; CBC, CMP, sed rate, CRP on 06/13/2018          Process Instructions     Please select Home Care Services medically necessary.     Based on the above findings, I certify that this patient is confined to the home and needs intermittent skilled nursing care, physical therapry and / or speech therapy or continues to need occupational therapy. The patient is under my care, and I have initiated the establishment of the plan of care. This patient will be followed by a physician who will periodically review the plan of care.    Collection Information     Consult Order Info     ID Description Priority Start Date Start Time   [id number] Home Health face-to-face (FTF) Encounter Routine 06/10/2018 10:24 AM   Provider Specialty Referred to   ______________________________________ _____________________________________   Acknowledgement Info     For At Acknowledged By Acknowledged On   Placing Order 06/10/18 1025 Aleen Campi, RN 06/10/18 1036   Patient Information     Patient Name  Craig Phelps, Craig Phelps Sex  Male DOB  [birth year]   Additional Information     Associated Reports External References   Priority and Order Details [employer]       Signed by: Makiyla Linch L.  Romeo Apple  Date Time: 06/11/18 12:17 PM

## 2018-06-11 NOTE — Progress Notes (Signed)
Thoracic Surgery Daily Progress Note  Team Spectra: [phone] / [phone]      Hospital Day: Hospital Day: 4  Primary Procedure: L VATS decortication, resection of abscess    Assessment:   Craig Phelps is a 23 y.o. 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 Doing well w/ anticipated [place] today    Plan:   - Chest tube to water seal. Will plan to [place] today.  - AM CXR Reviewed. Repeat imaging prior to f/u appointment  - Ambulation as much as possible, goal is >20 laps, encourage IS  - Q4 nebs, pap therapy  - am labs reviewed - additional mag repletion  - PRN tramadol for pain control  - Abx ertapenem, diflucan (coccidioides). Midline in place. Appreciate ID recs  - Regular diet, bowel regimen    Interval History:   24 hour Events: Ambulating regularly. Pain well controlled.    Subjective: Feels well this morning. Ambulating regularly. Denies shortness of breath. Eager for [place]    Physical Exam:   Current Vitals:   BP 112/64    Pulse 100    Temp 97.9 F (36.6 C) (Oral)    Resp 18    Ht 1.854 m (6\' 1" )    Wt 70.2 kg (154 lb 11.2 oz)    SpO2 98%    BMI 20.41 kg/m       GEN: sitting up in chair, NAD   COR: RRR  LUNGS: B/L CTAB, good respiratory effort  L CT w/ serosanguinous output, tidaling  INCISION/WOUND: well approximated w/o erythema, drainage; dermabond intact   ABD: soft, nttp, non-distended  EXTREMITIES: warm, no edema    Lines/Drains:   L Chest Tube: 70mL serosanguinous drainage in 24hrs. Tidaling.     Laboratory and Radiological Results:   Pending am CXR 4/22.    Lab Results   Component Value Date    WBC 12.56 (H) 06/11/2018    HGB 9.4 (L) 06/11/2018    HCT 30.7 (L) 06/11/2018    MCV 81.0 06/11/2018    PLT 556 (H) 06/11/2018     Lab Results   Component Value Date    NA 138 06/11/2018    K 4.2 06/11/2018    CL 100 06/11/2018    CO2 27 06/11/2018    BUN 11.0  06/11/2018    CREAT 0.9 06/11/2018    MG 1.6 06/11/2018         X-ray Chest Ap Portable (daily)    Result Date: 06/10/2018  Interval decrease in a small loculated component at the level of the left lung base. There has been no other significant interval change. Stephannie Peters, MD 06/10/2018 9:40 AM

## 2018-06-19 ENCOUNTER — Other Ambulatory Visit: Payer: Self-pay | Admitting: Family

## 2018-06-25 LAB — MISCELLANEOUS QUEST TEST

## 2018-07-03 ENCOUNTER — Other Ambulatory Visit: Payer: Self-pay | Admitting: Specialist

## 2018-11-02 ENCOUNTER — Other Ambulatory Visit: Payer: Self-pay | Admitting: Family

## 2019-06-04 ENCOUNTER — Other Ambulatory Visit (INDEPENDENT_AMBULATORY_CARE_PROVIDER_SITE_OTHER): Payer: Self-pay | Admitting: Internal Medicine

## 2023-12-19 ENCOUNTER — Other Ambulatory Visit (INDEPENDENT_AMBULATORY_CARE_PROVIDER_SITE_OTHER): Payer: Self-pay | Admitting: Internal Medicine
